# Patient Record
Sex: Female | Born: 1970 | Race: White | Hispanic: No | Marital: Married | Smoking: Former smoker
Health system: Southern US, Community
[De-identification: ages and names within clinical notes are randomized; demographics above are authoritative.]

## PROBLEM LIST (undated history)

## (undated) DIAGNOSIS — R0600 Dyspnea, unspecified: Secondary | ICD-10-CM

## (undated) DIAGNOSIS — I471 Supraventricular tachycardia, unspecified: Secondary | ICD-10-CM

## (undated) DIAGNOSIS — Z9889 Other specified postprocedural states: Secondary | ICD-10-CM

## (undated) DIAGNOSIS — T7840XA Allergy, unspecified, initial encounter: Secondary | ICD-10-CM

## (undated) DIAGNOSIS — I209 Angina pectoris, unspecified: Secondary | ICD-10-CM

## (undated) DIAGNOSIS — F419 Anxiety disorder, unspecified: Secondary | ICD-10-CM

## (undated) DIAGNOSIS — M1990 Unspecified osteoarthritis, unspecified site: Secondary | ICD-10-CM

## (undated) DIAGNOSIS — R112 Nausea with vomiting, unspecified: Secondary | ICD-10-CM

## (undated) DIAGNOSIS — Z9189 Other specified personal risk factors, not elsewhere classified: Secondary | ICD-10-CM

## (undated) DIAGNOSIS — F329 Major depressive disorder, single episode, unspecified: Secondary | ICD-10-CM

## (undated) DIAGNOSIS — T8859XA Other complications of anesthesia, initial encounter: Secondary | ICD-10-CM

## (undated) DIAGNOSIS — F32A Depression, unspecified: Secondary | ICD-10-CM

## (undated) DIAGNOSIS — G9050 Complex regional pain syndrome I, unspecified: Secondary | ICD-10-CM

## (undated) HISTORY — DX: Anxiety disorder, unspecified: F41.9

## (undated) HISTORY — DX: Other specified personal risk factors, not elsewhere classified: Z91.89

## (undated) HISTORY — DX: Complex regional pain syndrome I, unspecified: G90.50

## (undated) HISTORY — PX: SALPINGOSTOMY: SHX2372

## (undated) HISTORY — PX: APPENDECTOMY: SHX54

## (undated) HISTORY — DX: Depression, unspecified: F32.A

## (undated) HISTORY — DX: Unspecified osteoarthritis, unspecified site: M19.90

## (undated) HISTORY — DX: Allergy, unspecified, initial encounter: T78.40XA

## (undated) HISTORY — DX: Major depressive disorder, single episode, unspecified: F32.9

## (undated) HISTORY — PX: CHOLECYSTECTOMY: SHX55

---

## 1998-05-28 ENCOUNTER — Ambulatory Visit (HOSPITAL_COMMUNITY): Admission: RE | Admit: 1998-05-28 | Discharge: 1998-05-29 | Payer: Self-pay | Admitting: Surgery

## 1998-11-24 ENCOUNTER — Emergency Department (HOSPITAL_COMMUNITY): Admission: EM | Admit: 1998-11-24 | Discharge: 1998-11-24 | Payer: Self-pay | Admitting: Emergency Medicine

## 2000-03-26 ENCOUNTER — Encounter: Payer: Self-pay | Admitting: Emergency Medicine

## 2000-03-26 ENCOUNTER — Emergency Department (HOSPITAL_COMMUNITY): Admission: EM | Admit: 2000-03-26 | Discharge: 2000-03-26 | Payer: Self-pay | Admitting: Emergency Medicine

## 2000-03-27 ENCOUNTER — Emergency Department (HOSPITAL_COMMUNITY): Admission: EM | Admit: 2000-03-27 | Discharge: 2000-03-27 | Payer: Self-pay | Admitting: Emergency Medicine

## 2000-10-10 ENCOUNTER — Other Ambulatory Visit: Admission: RE | Admit: 2000-10-10 | Discharge: 2000-10-10 | Payer: Self-pay | Admitting: *Deleted

## 2002-04-15 ENCOUNTER — Emergency Department (HOSPITAL_COMMUNITY): Admission: EM | Admit: 2002-04-15 | Discharge: 2002-04-15 | Payer: Self-pay

## 2002-08-01 ENCOUNTER — Encounter: Payer: Self-pay | Admitting: *Deleted

## 2002-08-01 ENCOUNTER — Encounter: Admission: RE | Admit: 2002-08-01 | Discharge: 2002-08-01 | Payer: Self-pay | Admitting: *Deleted

## 2002-08-03 ENCOUNTER — Encounter: Payer: Self-pay | Admitting: Obstetrics & Gynecology

## 2002-08-03 ENCOUNTER — Inpatient Hospital Stay (HOSPITAL_COMMUNITY): Admission: AD | Admit: 2002-08-03 | Discharge: 2002-08-03 | Payer: Self-pay | Admitting: Obstetrics & Gynecology

## 2002-08-06 ENCOUNTER — Ambulatory Visit (HOSPITAL_COMMUNITY): Admission: AD | Admit: 2002-08-06 | Discharge: 2002-08-06 | Payer: Self-pay | Admitting: *Deleted

## 2002-08-06 ENCOUNTER — Encounter (INDEPENDENT_AMBULATORY_CARE_PROVIDER_SITE_OTHER): Payer: Self-pay | Admitting: Specialist

## 2003-06-16 ENCOUNTER — Other Ambulatory Visit: Admission: RE | Admit: 2003-06-16 | Discharge: 2003-06-16 | Payer: Self-pay | Admitting: Obstetrics and Gynecology

## 2003-08-25 ENCOUNTER — Encounter: Payer: Self-pay | Admitting: Obstetrics and Gynecology

## 2003-08-25 ENCOUNTER — Ambulatory Visit (HOSPITAL_COMMUNITY): Admission: RE | Admit: 2003-08-25 | Discharge: 2003-08-25 | Payer: Self-pay | Admitting: Obstetrics and Gynecology

## 2003-09-07 ENCOUNTER — Encounter: Payer: Self-pay | Admitting: *Deleted

## 2003-09-07 ENCOUNTER — Inpatient Hospital Stay (HOSPITAL_COMMUNITY): Admission: AD | Admit: 2003-09-07 | Discharge: 2003-09-07 | Payer: Self-pay | Admitting: Obstetrics and Gynecology

## 2004-02-11 ENCOUNTER — Inpatient Hospital Stay (HOSPITAL_COMMUNITY): Admission: AD | Admit: 2004-02-11 | Discharge: 2004-02-11 | Payer: Self-pay | Admitting: Obstetrics and Gynecology

## 2004-02-22 ENCOUNTER — Observation Stay (HOSPITAL_COMMUNITY): Admission: AD | Admit: 2004-02-22 | Discharge: 2004-02-23 | Payer: Self-pay | Admitting: Obstetrics and Gynecology

## 2004-03-07 ENCOUNTER — Inpatient Hospital Stay (HOSPITAL_COMMUNITY): Admission: AD | Admit: 2004-03-07 | Discharge: 2004-03-07 | Payer: Self-pay | Admitting: Obstetrics and Gynecology

## 2004-03-08 ENCOUNTER — Inpatient Hospital Stay (HOSPITAL_COMMUNITY): Admission: RE | Admit: 2004-03-08 | Discharge: 2004-03-21 | Payer: Self-pay | Admitting: *Deleted

## 2004-03-26 ENCOUNTER — Encounter (INDEPENDENT_AMBULATORY_CARE_PROVIDER_SITE_OTHER): Payer: Self-pay | Admitting: *Deleted

## 2004-03-26 ENCOUNTER — Inpatient Hospital Stay (HOSPITAL_COMMUNITY): Admission: AD | Admit: 2004-03-26 | Discharge: 2004-03-29 | Payer: Self-pay | Admitting: *Deleted

## 2004-04-29 ENCOUNTER — Other Ambulatory Visit: Admission: RE | Admit: 2004-04-29 | Discharge: 2004-04-29 | Payer: Self-pay | Admitting: *Deleted

## 2004-07-28 ENCOUNTER — Emergency Department (HOSPITAL_COMMUNITY): Admission: EM | Admit: 2004-07-28 | Discharge: 2004-07-28 | Payer: Self-pay | Admitting: Emergency Medicine

## 2005-05-05 ENCOUNTER — Other Ambulatory Visit: Admission: RE | Admit: 2005-05-05 | Discharge: 2005-05-05 | Payer: Self-pay | Admitting: Addiction Medicine

## 2005-05-12 ENCOUNTER — Encounter: Admission: RE | Admit: 2005-05-12 | Discharge: 2005-05-12 | Payer: Self-pay | Admitting: Gastroenterology

## 2005-06-03 ENCOUNTER — Emergency Department: Payer: Self-pay | Admitting: Emergency Medicine

## 2005-06-03 ENCOUNTER — Other Ambulatory Visit: Payer: Self-pay

## 2007-07-18 ENCOUNTER — Other Ambulatory Visit: Admission: RE | Admit: 2007-07-18 | Discharge: 2007-07-18 | Payer: Self-pay | Admitting: Obstetrics and Gynecology

## 2011-05-12 NOTE — Discharge Summary (Signed)
NAME:  Tiffany [name], Tiffany [name]                             ACCOUNT NO.:  [id number]   MEDICAL RECORD NO.:  [id number]                   PATIENT TYPE:  INP   LOCATION:  9148                                 FACILITY:  [facility]   PHYSICIAN:  Gerri Spore B. Earlene Plater, M.D.               DATE OF BIRTH:  1971-11-25   DATE OF ADMISSION:  03/08/2004  DATE OF DISCHARGE:  03/21/2004                                 DISCHARGE SUMMARY   ADMISSION DIAGNOSES:  1. Thirty-three weeks.  2. Preeclampsia.  3. Complaints of headache.   DISCHARGE DIAGNOSES:  1. Thirty-three weeks.  2. Preeclampsia.  3. Complaints of headache, resolved.   HISTORY OF PRESENT ILLNESS:  The patient was admitted after being monitored  as an outpatient with asymptomatic preeclampsia.  She had complaints of the  worst headache in her life with associated epigastric discomfort.   HOSPITAL COURSE:  The patient was admitted and 24-hour urine repeated.  CT  scan of the head, PIH labs were performed - all of which were normal.  The  patient had continued complaints of severe headache despite essentially  normal blood pressures in the 130s over 70s to no higher than 150s over 90s.  Neurological consultation was obtained and no abnormalities were found per  the neurologist.  Recommendation was for pain control with Darvocet which  the patient refused to take.  Recommendation had been for MRI and lumbar  puncture if symptoms worsened.  The patient was observed with intermittent  monitoring with reassuring status and intermittent PIH labs which were  normal.  Ultrasound showed appropriate fetal growth and reassuring  biophysical profile and amniotic fluid levels.   Ultimately, the patient's symptoms resolved as she had no further complaints  of headache or epigastric pain.  She had had continuously normal labs,  monitoring, and essentially normal blood pressures.  Her 24-hour urine was  stable and she was therefore discharged to home to be managed as an  outpatient.   DISCHARGE INSTRUCTIONS:  Preeclampsia precautions are given.   FOLLOW-UP:  [facility] in 2 days.   DISPOSITION AT DISCHARGE:  Improved.   DISCHARGE MEDICATIONS:  Prenatal vitamins one daily.                                               Gerri Spore B. Earlene Plater, M.D.    WBD/MEDQ  D:  03/23/2004  T:  03/23/2004  Job:  [id number]

## 2011-05-12 NOTE — Discharge Summary (Signed)
NAME:  Tiffany [name], Tiffany [name]                             ACCOUNT NO.:  [id number]   MEDICAL RECORD NO.:  [id number]                   PATIENT TYPE:  INP   LOCATION:  9124                                 FACILITY:  [facility]   PHYSICIAN:  Maxie Better, M.D.            DATE OF BIRTH:  Nov 10, 1971   DATE OF ADMISSION:  03/26/2004  DATE OF DISCHARGE:  03/29/2004                                 DISCHARGE SUMMARY   ADMISSION DIAGNOSES:  1. Preterm labor.  2. Intrauterine gestation at 35+ weeks.  3. Breech presentation.  4. Previous cesarean section.   DISCHARGE DIAGNOSES:  1. Intrauterine gestation at 35+ weeks, delivered.  2. Preterm labor.  3. Complete breech presentation.  4. Previous cesarean section.  5. Postoperative anemia.   PROCEDURE:  Repeat cesarean section.   HISTORY OF PRESENT ILLNESS:  A 40 year old gravida 6 para 2-0-3-2 female at  [redacted] weeks gestation with previous cesarean section, known breech at  presentation, who presented in preterm labor.  This patient has had  bilateral salpingectomies due to ectopic pregnancy including a left cornual  horn removal.  The patient had transfundal surgery and therefore cannot  labor.   HOSPITAL COURSE:  The patient was admitted.  She was taken to the operating  room where she underwent the repeat cesarean section with resultant delivery  of a breech live female, 7 pounds 4 ounces, with Apgars of 8 and 9.  The  patient had an uncomplicated postoperative course.  A CBC on postoperative  day #1 showed a hemoglobin of 10.9; hematocrit of 31.9; platelets 218,000.  By postoperative day #3 the patient was tolerating a regular diet, no  evidence of infection, was deemed well to be discharged home.   DISPOSITION:  Home.   CONDITION:  Stable.   FOLLOW-UP:  Follow-up appointment at [facility] in 4-6 weeks.   DISCHARGE MEDICATIONS:  1. Motrin 600 mg one p.o. q.6h. p.r.n. pain.  2. Percocet 5 #20 one to two tablets p.o. q.4h. p.r.n. pain.  3. Over-the-counter iron supplementation one p.o. b.i.d.   DISCHARGE INSTRUCTIONS:  Per postpartum booklet.  PIH warning signs were  reviewed.                                               Maxie Better, M.D.    [place]/MEDQ  D:  04/08/2004  T:  04/08/2004  Job:  [id number]

## 2011-05-12 NOTE — Consult Note (Signed)
NAME:  Tiffany [name], Tiffany [name]                             ACCOUNT NO.:  [id number]   MEDICAL RECORD NO.:  [id number]                   PATIENT TYPE:  MAT   LOCATION:  MATC                                 FACILITY:  [facility]   PHYSICIAN:  Lenoard Aden, M.D.             DATE OF BIRTH:  11-02-1971   DATE OF CONSULTATION:  02/11/2004  DATE OF DISCHARGE:                                   CONSULTATION   CHIEF COMPLAINT:  Decreased fetal movement, headache and visual scotomata.   HISTORY OF PRESENT ILLNESS:  A 40 year old white female, G6, P2, at 29  weeks; who presents with aforementioned symptoms.  Previous history of  pregnancy-induced hypertension.   ALLERGIES:  STADOL.   MEDICATIONS:  Prenatal vitamins.   PAST HISTORY:  1. History of ectopic pregnancy, 15-week pregnancy loss.  2. Cesarean section, VBAC and recurrent ectopic pregnancy.  3. This was an IVF-induced conception.  4. History of bladder infections, kidney infections.  5. OCD and depression.  6. Asthma.   FAMILY HISTORY:  Hypertension, angina, COPD and kidney disease.   PHYSICAL EXAMINATION:  GENERAL:  She is a well developed, well nourished  white female; in no acute distress.  HEENT:  Normal.  LUNGS:  Clear.  HEART:  Regular rhythm.  ABDOMEN:  Soft, gravid and nontender.  EXTREMITIES:  No cords.  NEUROLOGIC:  Nonfocal.  PELVIC:  Deferred.   LABORATORY DATA:  NST reactive, no contractions.  LFT within normal limits.  CBC within normal limits.  LDH less than 105.  Uric acid 2.1.   IMPRESSION:  1. A 29-week OB.  2. Decreased fetal movement, with reassuring fetal heart tracing.  3. Visual and neurologic symptoms.  No evidence of preeclampsia with normal     blood pressures and normal labs.   PLAN:  Discharge home.  Pregnancy-induced hypertension  precautions are  given.  Follow up in the office as scheduled in one week.  Fetal activity is  discussed.                                               Lenoard Aden,  M.D.    RJT/MEDQ  D:  02/11/2004  T:  02/11/2004  Job:  [id number]

## 2011-05-12 NOTE — Consult Note (Signed)
NAME:  Tiffany [name], Tiffany [name]                             ACCOUNT NO.:  [id number]   MEDICAL RECORD NO.:  [id number]                   PATIENT TYPE:  MAT   LOCATION:  MATC                                 [facility]ACILITY[facility]   PHYSICIAN:  Lenoard Aden, M.D.             DATE OF BIRTH:  1971/11/20   DATE OF CONSULTATION:  03/07/2004  DATE OF DISCHARGE:                                   CONSULTATION   CHIEF COMPLAINT:  Rule out preeclampsia.   HISTORY OF PRESENT ILLNESS:  The patient is a 40 year old white female, G6,  P2, at [redacted] weeks gestation who presents with elevated blood pressure,  decreased fetal movement, headache, and visual symptomatology. She denies  epigastric pain, nausea, or vomiting. She has allergies to Stadol.  Medications are prenatal vitamins.   PAST HISTORY:  Remarkable for history of ectopic pregnancy, 15 week  pregnancy loss, Cesarean section, VBAC, recurrent ectopic pregnancy, IVF  induced conception, history of bladder and kidney infection, history of OCD  and depression, and history of asthma.   FAMILY HISTORY:  Hypertension, angina, COPD, and kidney disease.   PHYSICAL EXAMINATION:  VITAL SIGNS: Today, blood pressure 125/77.  NEUROLOGIC: Nonfocal.  HEENT: Normal.  LUNGS: Clear.  HEART: Regular rate and rhythm.  ABDOMEN: Soft, gravid, nontender. DTRs are 2 to 3+. No evidence of clonus.  Trace pretibial edema noted.   PIH panel within normal limits. FFN is negative. Cervical exam per os is 1  cm, vertex, thick, and -2.   IMPRESSION:  1. A 32-week pregnancy.  2. No evidence of preeclampsia.  3. Reassuring fetal heart rates as well as a reactive NST today, and BPP of     8/8.   PLAN:  Discharge home; follow up in 24 hours; give Procardia 10 mg to take  q.4h. times 24 hours; follow up in the office with Dr. Earlene Plater within one day.                                               Lenoard Aden, M.D.    RJT/MEDQ  D:  03/07/2004  T:  03/08/2004  Job:  [id number]

## 2011-05-12 NOTE — Op Note (Signed)
NAME:  Tiffany [name], Tiffany [name]                             ACCOUNT NO.:  [id number]   MEDICAL RECORD NO.:  [id number]                   PATIENT TYPE:  AMB   LOCATION:  SDC                                  [facility]ILITY:  [facility]   PHYSICIAN:  Gerri Spore B. Earlene Plater, M.D.               DATE OF BIRTH:  [birth year]   DATE OF PROCEDURE:  08/06/2002  DATE OF DISCHARGE:                                 OPERATIVE REPORT   PREOPERATIVE DIAGNOSIS:  Right tubal pregnancy. Desires salpingectomy.   POSTOPERATIVE DIAGNOSIS:  Right tubal pregnancy. Desires salpingectomy.   PROCEDURE:  1. Open laparoscopy.  2. Lysis of adhesions.  3. Right salpingectomy.   SURGEON:  Chester Holstein. Earlene Plater, M.D.   ANESTHESIA:  General.   FINDINGS:  Right tubal pregnancy. Mid tubal position unruptured. About 75 cc  of old blood in the pelvis. Normal tubes and ovaries bilaterally. Some filmy  adhesions over the left ovary from the sigmoid colon to the left pelvic  sidewall over the left ovary. Normal appearing uterus.   ESTIMATED BLOOD LOSS:  About 75 cc of old blood; otherwise, none.   SPECIMENS:  Right tubal pregnancy.   COMPLICATIONS:  None.   DISPOSITION:  To the recovery room in stable condition.   INDICATIONS FOR PROCEDURE:  A patient with a history of left salpingectomy  for ectopic pregnancy. 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  She presents with  a right ectopic pregnancy and the patient  has decided she desires  salpingectomy after being offered less evasive and less definitive treatment  with salpingostomy or methotrexate. The patient  expressly understands this  would leave her without a functioning tube and, therefore, unable to  naturally conceive.  I have mentioned in vitro fertilization would be an  option if that is what she desires should they decide to proceed with  further childbirth.   PROCEDURE:  The patient was taken to the operating room and general  anesthesia obtained. She was placed in ski position and prepped  and draped  in the standard fashion. The bladder was drained with a Foley catheter. The  cervix was grasped with a single-tooth tenaculum and the Hulka tenaculum  inserted and attached. The instruments were then removed from the vagina.   Attention was turned to the abdomen and gown and gloves were changed. A 10  mm infraumbilical skin fold incision was done with knife after local  infiltration with 5 cc of 0.5% Marcaine. The dissection was carried sharply  to the fascia. The fascia was divided sharply with a knife and elevated with  Kocher clamps. The patient's anterior sheath and peritoneum were then  entered sharply. Pursestring sutures of #0 Vicryl was placed around the  fascial defect and the Hasson cannula inserted and secured.  Pneumoperitoneum obtained after abdominal placement confirmed. The inferior  ports were placed on the left and right about 8 cm out from  the midline, 2  cm above the symphysis after local infiltration with Marcaine under direct  laparoscopic visualization. The omental adhesions were 1-2 cm areas in the  midline near a previous vertical midline incision. These were inspected and  found to be free of underlying bowel. They were, therefore, cauterized at  the adhesions on the abdominal wall with bipolar and divided sharply.   The patient was then placed in Trendelenburg position and the bowel  mobilized superiorly. A right tubal pregnancy was noted as outlined above.  There were some adhesions from the right tube to the right pelvic sidewall  which were taken down sharply and this freed the right tube. The mesosalpinx  was then cauterized with bipolar cautery and divided sharply down to the  level of the uterotubal junction. The specimen was removed through the  Hasson port and submitted as right tubal pregnancy. The site of dissection  was hemostatic after the pelvis was irrigated. The course of each ureter was  identified and appeared to be normal in location  and well-away from the area  of the dissection.   The inferior ports were removed and their sites were hemostatic. The Hasson  and scope were removed and gas released. I inserted my index finger through  the fascial defect, snugged the pursestring suture down to fasten the  fascial defect to it. No intraabdominal contents were near it prior to  closure. Each skin site was closed with subcuticular 4-0 Vicryl.   The patient  tolerated the procedure well. There were no complications. She  was taken to the recovery room in good postoperative condition.Gerri Spore B. Earlene Plater, M.D.    WBD/MEDQ  D:  08/06/2002  T:  08/07/2002  Job:  [id number]

## 2011-05-12 NOTE — Op Note (Signed)
NAME:  Tiffany [name], Tiffany [name]                             ACCOUNT NO.:  [id number]   MEDICAL RECORD NO.:  [id number]                   PATIENT TYPE:  INP   LOCATION:  9124                                 FACILITY:  [facility]   PHYSICIAN:  Maxie Better, M.D.            DATE OF BIRTH:  June 17, 1971   DATE OF PROCEDURE:  03/26/2004  DATE OF DISCHARGE:                                 OPERATIVE REPORT   PREOPERATIVE DIAGNOSES:  1. Preterm labor.  2. Intrauterine gestation at 35+ weeks.  3. Breech presentation.  4. Previous cesarean section.   PROCEDURE:  Repeat cesarean section, Kerr hysterotomy.   POSTOPERATIVE DIAGNOSES:  1. Preterm labor.  2. Intrauterine pregnancy at 35+ weeks.  3. Complete breech presentation.  4. Previous cesarean section.   ANESTHESIA:  Spinal.   SURGEON:  Maxie Better, M.D.   ASSISTANT:  Gerri Spore B. Earlene Plater, M.D.   FINDINGS:  Complete breech presentation of a live female, 7 pounds 4 ounces,  Apgars of 8 and 9.  Placenta spontaneous, intact, to pathology.  Normal  right ovary, surgically absent or not seen left ovary, and surgically absent  tubes bilaterally.   INDICATIONS:  This is a 40 year old gravida 6, para 2-0-3-2, female at 17+  weeks' gestation with a previous cesarean section and known breech  presentation, who presented for [facility] labs and nonstress test at [facility] on March 26, 2004, who was found to be having contractions with a  cervical exam of 3 cm, 70%, with the presenting part out of the pelvis.  The  patient's exam in the office last week was 1 cm dilatation.  Her history has  been complicated by chronic headache, for which she has had neurological  evaluation, and for prolonged admissions for evaluation for preeclampsia due  to proteinuria.  This is an IVF pregnancy.  The patient has had bilateral  salpingectomies due to ectopic pregnancy, including a left cornual horn  removal.  She has had transfundal surgery and therefore cannot labor.   Given  the cervical exam, the decision was made to proceed with delivery of this  patient.  Risks and benefits of the procedure have been explained to the  patient, consent was signed, and the patient was transferred to the  operating room.   PROCEDURE:  Under adequate spinal anesthesia, the patient was placed in a  supine position with a left lateral tilt.  She was sterilely prepped and  draped in the usual fashion.  An indwelling Foley catheter was sterilely  placed.  Marcaine 0.25% was injected along the previous Pfannenstiel skin  incision.  The Pfannenstiel skin incision was then made, carried down to the  rectus fascia, rectus fascia incised in the midline, extended bilaterally.  The rectus fascia was then bluntly and sharply dissected off the rectus  muscle in an inferior fashion and the parietal peritoneum was entered prior  to dissecting off the rectus  fascia superiorly due to the thinness of the  rectus muscle bilaterally.  On entering the cavity there were no adhesions  noted fundally, and there were minimal bladder adhesions.  The parietal  peritoneum was extended superiorly and inferiorly.  The vesicouterine  peritoneum was then developed.  There was a well-developed lower uterine  segment, thin, noted at the time of the surgery.  A curvilinear low  transverse uterine incision was then made and extended bilaterally using  bandage scissors.  Clear amniotic fluid sac noted.  The sac was ruptured and  subsequent delivery  of a live female from a complete breech presentation.  The baby was bulb-suctioned on the abdomen, the cord was clamped and cut.  The baby was transferred to the waiting pediatrician, who assigned Apgars of  8 and 9 at one and five minutes.  The placenta was spontaneous intact to  pathology.  The uterine cavity was cleaned of debris.  Uterine incision was  closed in two layers, the first layer with 0 Monocryl running locked stitch,  the second layer was closed  with 0 Monocryl in an imbricating fashion.  Good  hemostasis was then noted, small bleeders cauterized.  The abdomen was  irrigated, suctioned of debris.  The remaining findings as noted above.  The  parietal peritoneum was closed with 3-0 Vicryl suture, the rectus fascia was  approximated with 0 Vicryl x2, the subcutaneous area was irrigated,  suctioned of debris, small bleeders cauterized.  The skin was then  approximated using Ethicon staples.  Specimen was placenta, sent to  pathology.  Estimated blood loss was 500 mL.  Intraoperative fluid was 2 L.  Urine output was 200 mL clear yellow urine.  Sponge and instrument count x2  was correct.  Complication was none.  The patient tolerated the procedure  well and was transferred to the recovery room in stable condition.                                               Maxie Better, M.D.    [place]/MEDQ  D:  03/26/2004  T:  03/28/2004  Job:  [id number]

## 2011-05-12 NOTE — Consult Note (Signed)
NAME:  Tiffany [name], Tiffany [name]                             ACCOUNT NO.:  [id number]   MEDICAL RECORD NO.:  [id number]                   PATIENT TYPE:  INP   LOCATION:  9154                                 FACILITY:  [facility]   PHYSICIAN:  Deanna Artis. Sharene Skeans, M.D.           DATE OF BIRTH:  [birth year]   DATE OF CONSULTATION:  03/10/2004  DATE OF DISCHARGE:                                   CONSULTATION   CHIEF COMPLAINT:  Headaches.   HISTORY OF THE PRESENT CONDITION:  Tiffany [name] is a 40 year old gravida 6 para  0-2-3-2 woman who is now between 35 and [redacted] weeks gestational age.  The  patient has a history of preeclampsia which was also true in her previous  pregnancy.  The patient was admitted with a severe headache, changes in  vision, and epigastric pain.  She had very sharp headaches that were  lancinating, extending from temple to temple, lasting for less than 30  seconds in duration that occurred between five and ten times on Monday  night.  She has had a few brief headaches like this since that time but  basically her headache has now become a dull frontally-predominant,  unremitting headache that is unassociated with nausea, vomiting, sensitivity  to light or sound, or movement.   The patient has had variable blood pressures.  She has not had nasal  drainage,  runny nose, cough, fever, or other signs of infection,  meningismus, or signs of intracranial hemorrhage.   CT scan of the brain today was normal other than suggesting sphenoid  sinusitis.  Careful observation of the brain window and bone window suggest  that the sinusitis is actually volume averaging of the bone above and below  the sphenoid sinus and there is no sinusitis at this time.  In addition, the  patient's symptoms would not suggest the presence of sinusitis on the basis  of no fever, purulent drainage, runny nose, or postnasal drip.   In addition, though, blood pressures remain elevated for a pregnancy lady.  They have not  been high enough that one would ordinarily think of toxemia as  an etiology for her symptoms.   PAST MEDICAL HISTORY:  Remarkable for panic disorder, migraine headaches.  She states categorically that the headaches that she is experiencing now are  not at all like her migraines which were unilateral, pounding, associated  with nausea and vomiting, sensitivity to light and sound, and last occurred  a couple of years ago.   The patient has had two tubal pregnancies.  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  The patient  has had two other successful pregnancies.  She also has a history of  obsessive-compulsive disorder, depression, panic disorder, and asthma.  For  this reason, though there are medications that would be useful for her with  her headaches, she  stays away from them because they usually have  exacerbated one or more of her conditions.   FAMILY HISTORY:  Remarkable for migraine headaches in her mother and sister;  for depression in her mother; for hypertension, heart disease, COPD, and  kidney disease in other family members.  There is no other neurological  family history including berry aneurysms, tumors, hydrocephalus.   MEDICATIONS:  Prenatal vitamins.  She is now on Ambien at bedtime.  It does  not appear to me that she is on other medications at this time for blood  pressure.   DRUG ALLERGIES:  None.   INTOLERANCES:  STADOL, which exacerbated her panic disorder.   SOCIAL HISTORY:  The patient's husband works for [employer].  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  She has two older children who are at her bedside.  She does not smoke nor  does she use alcohol.   PHYSICAL EXAMINATION TODAY:  GENERAL:  This is a well-developed, well-  nourished, pleasant woman, blond-haired, blue-eyed, in no acute distress.  She says that her headache is 8/10 but her face does not  certainly suggest  that.  VITAL SIGNS:  Blood pressure 132/78, resting pulse 80.  HEENT:  No signs of infection, supple neck, full range of motion.  No  localized tenderness in her head and neck region.  No loss of range of  motion in her head and neck.  No cranial or cervical bruits.  LUNGS:  Clear to auscultation.  HEART:  No murmurs, pulses normal  ABDOMEN:  Soft, nontender, gravid, in no distress.  EXTREMITIES:  Well formed without edema, cyanosis, alternations in tone, or  tight heel cords.  NEUROLOGIC:  Mental status:  Awake, alert, pleasant, cooperative, normal  orientation, language, memory, and fund of knowledge.  Cranial nerves:  Round reactive pupils, normal fundi, with sharp disc margins.  The patient  does not have obvious venous pulsations but I do not think she has increased  intracranial pressure nor even early papiledema.  Visual fields full to  double simultaneous stimuli.  Extraocular movements full and conjugate.  Symmetric facial strength and sensation.  Air conduction greater than bone  conduction bilaterally.  She was able to protrude her tongue and elevate her  uvula midline.   Motor examination:  Normal strength, tone, and mass.  Good fine motor  movements.  No pronator drift.  Sensation intact to cold, vibration, and  stereoagnosis.  Cerebellar examination:  Good finger-to-nose, rapid  repetitive movements.  Gait and station was normal.  She can walk on her  heels, toes, perform a tandem without difficulty.  Romberg negative.  Deep  tendon reflexes symmetric and quite brisk generally.  The patient had  bilateral flexor plantar responses.   IMPRESSION:  Headache disorder, 784.0.  Etiology of the headache is unknown.  It may be related to her blood pressure; however, this is not certain.   The patient does not show any signs of posterior circulation edema that  might be expected with eclampsia.  She has a normal CT scan of the brain and does not have sphenoid  sinusitis as suggested in the formal CT report.  There are no signs of subarachnoid hemorrhage, hydrocephalus, abscess, or  tumor.  She does not have meningismus or any signs of infection in the head  and neck.  She has a nonfocal neurologic examination.  In addition, this  headache has happened before in  her last third trimester of pregnancy.   Finally, the patient has a migraine disorder but it has not been active and  the headaches do not represent migraines.   RECOMMENDATIONS:  Tylenol as is currently being done, 650 mg q.4-6h.  The  patient could get Darvocet-N 100 if the headaches worsened.  There is no  need for further imaging or lumbar puncture.  I would also continue to work  to control the blood pressure as I know you are doing.  At the present I do  not believe that there is a definable cause for her headaches and I do not  at present see any serious cause of headaches that would be related to  further  morbidity or mortality is etiology of her problem at this time.  I  appreciate the opportunity to see her.  I will be happy to see her again  should her symptoms worsen.  If you have questions about this or I can be of  assistance do not hesitate to contact me.                                               Deanna Artis. Sharene Skeans, M.D.    [facility]  D:  03/10/2004  T:  03/11/2004  Job:  [id number]

## 2011-05-12 NOTE — H&P (Signed)
NAME:  Tiffany [name], Tiffany [name]                             ACCOUNT NO.:  [id number]   MEDICAL RECORD NO.:  [id number]                   PATIENT TYPE:  INP   LOCATION:  9175                                 FACILITY:  [facility]   PHYSICIAN:  Gerri Spore B. Earlene Plater, M.D.               DATE OF BIRTH:  1971-12-20   DATE OF ADMISSION:  03/08/2004  DATE OF DISCHARGE:                                HISTORY & PHYSICAL   ADMISSION DIAGNOSES:  1. Thirty-three week intrauterine pregnancy.  2. Preeclampsia.  3. Complaints of headache, visual disturbances, and epigastric pain.   HISTORY OF PRESENT ILLNESS:  A 40 year old white female, gravida 6, para 2,  at 33+ weeks, who was seen in the office today in follow-up with a history  of preeclampsia which was being managed as an outpatient.  She had  complaints today of a headache which she rated as an 8/10, visual  disturbances, and epigastric pain.  The patient has had reassuring  outpatient surveillance and an appropriately-growing fetus on ultrasound in  the office __________, 2005, where she was found to have an AFI of 19.7,  breech presentation has been persistent.   Given the escalation in symptoms without any other obvious explanation for  her complaints, she is admitted for close observation, rechecking of  preeclampsia labs, continuous monitoring, and plans for amniocentesis with  delivery if lungs are mature.  The patient has been informed this is not  tremendously likely; however, if lungs are mature with her complaints as  outlined above, it would seem reasonable to deliver her as they would likely  represent severe preeclampsia.  Of note, the range of blood pressure that  she has had has been in the 120s-150s/70s-90s.   PAST MEDICAL HISTORY:  Laparotomy with removal of cornual pregnancy.  By the  description in the operative note from [facility], this sounded like it  represented transfundal uterine surgery, in my opinion would be concerning  for possible  increased risk of rupture with trial of labor.  Notably, she  has successfully had VBAC thereafter.  In addition, she has had a recent  ectopic pregnancy and this pregnancy occurred after in vitro fertilization.  History of OCD and depression and asthma.   FAMILY HISTORY:  Hypertension, heart disease, COPD, and kidney disease.   MEDICATIONS:  Prenatal vitamins.   ALLERGIES:  STADOL.   PHYSICAL EXAMINATION:  VITAL SIGNS:  Blood pressure in the office today is  in the 120s/80s, otherwise normal.  GENERAL:  The patient is alert and oriented, in no acute distress.  She has  normal gait and normal motor function, and cranial nerve function appears to  be intact.  HEENT:  Normocephalic, atraumatic.  Sclerae are anicteric.  CARDIAC:  Regular rate and rhythm.  CHEST:  Lungs are clear to auscultation.  ABDOMEN:  Gravid with an appropriate fundal height, breech presentation by  Leopold's.  PELVIC:  Cervix is 1-2 cm dilated, 50% effaced, -2 station, and breech by  palpation.  EXTREMITIES:  Lower extremities show trace edema and 3+ deep tendon reflexes  without clonus.   ASSESSMENT:  Thirty-three plus week intrauterine pregnancy with  preeclampsia, now complaining of symptoms suggestive of severe preeclampsia.  Of note, she has received steroids earlier last week due to preterm  contractions and cervical change.  We will check PIH labs and monitor her  closely with serial blood pressures.  Should symptoms persist and no other  obvious explanation can be found, delivery may be indicated.  I have  discussed with the patient the possibility of amniocentesis for lung  maturity studies as this would certainly aid in the decision-making process,  and the patient is agreeable.  Arrangements will be made in this regard.                                               Gerri Spore B. Earlene Plater, M.D.    [facility]  D:  03/08/2004  T:  03/09/2004  Job:  [id number]

## 2013-11-10 ENCOUNTER — Ambulatory Visit (INDEPENDENT_AMBULATORY_CARE_PROVIDER_SITE_OTHER): Payer: 59 | Admitting: Family Medicine

## 2013-11-10 ENCOUNTER — Ambulatory Visit: Payer: 59

## 2013-11-10 VITALS — BP 116/72 | HR 80 | Temp 98.7°F | Resp 18 | Ht 64.5 in | Wt 220.0 lb

## 2013-11-10 DIAGNOSIS — Z124 Encounter for screening for malignant neoplasm of cervix: Secondary | ICD-10-CM

## 2013-11-10 DIAGNOSIS — N644 Mastodynia: Secondary | ICD-10-CM

## 2013-11-10 DIAGNOSIS — I471 Supraventricular tachycardia: Secondary | ICD-10-CM

## 2013-11-10 DIAGNOSIS — R059 Cough, unspecified: Secondary | ICD-10-CM

## 2013-11-10 DIAGNOSIS — R05 Cough: Secondary | ICD-10-CM

## 2013-11-10 DIAGNOSIS — N63 Unspecified lump in unspecified breast: Secondary | ICD-10-CM

## 2013-11-10 DIAGNOSIS — I498 Other specified cardiac arrhythmias: Secondary | ICD-10-CM

## 2013-11-10 DIAGNOSIS — F411 Generalized anxiety disorder: Secondary | ICD-10-CM

## 2013-11-10 LAB — POCT CBC
HCT, POC: 49.5 % — AB (ref 37.7–47.9)
Lymph, poc: 2.2 (ref 0.6–3.4)
MCH, POC: 31.5 pg — AB (ref 27–31.2)
MCHC: 31.7 g/dL — AB (ref 31.8–35.4)
MCV: 99.3 fL — AB (ref 80–97)
POC Granulocyte: 6.7 (ref 2–6.9)
POC LYMPH PERCENT: 23.2 %L (ref 10–50)
RDW, POC: 13.3 %
WBC: 9.3 10*3/uL (ref 4.6–10.2)

## 2013-11-10 LAB — TSH: TSH: 1.797 u[IU]/mL (ref 0.350–4.500)

## 2013-11-10 LAB — POCT URINALYSIS DIPSTICK
Bilirubin, UA: NEGATIVE
Ketones, UA: NEGATIVE
Leukocytes, UA: NEGATIVE
Nitrite, UA: NEGATIVE
Protein, UA: NEGATIVE
pH, UA: 5.5

## 2013-11-10 LAB — LIPID PANEL
Cholesterol: 147 mg/dL (ref 0–200)
HDL: 39 mg/dL — ABNORMAL LOW (ref >39)
Total CHOL/HDL Ratio: 3.8 Ratio
Triglycerides: 92 mg/dL (ref <150)
VLDL: 18 mg/dL (ref 0–40)

## 2013-11-10 LAB — COMPREHENSIVE METABOLIC PANEL
Alkaline Phosphatase: 147 U/L — ABNORMAL HIGH (ref 39–117)
BUN: 7 mg/dL (ref 6–23)
CO2: 25 mEq/L (ref 19–32)
Creat: 0.73 mg/dL (ref 0.50–1.10)
Glucose, Bld: 95 mg/dL (ref 70–99)
Sodium: 137 mEq/L (ref 135–145)
Total Bilirubin: 0.8 mg/dL (ref 0.3–1.2)

## 2013-11-10 LAB — POCT UA - MICROSCOPIC ONLY
Bacteria, U Microscopic: NEGATIVE
Casts, Ur, LPF, POC: NEGATIVE
Mucus, UA: NEGATIVE
WBC, Ur, HPF, POC: NEGATIVE
Yeast, UA: NEGATIVE

## 2013-11-10 NOTE — Progress Notes (Signed)
Subjective:    Patient ID: Tiffany [name], female    DOB: May 14, 1971, 42 y.o.   MRN: [id number]  HPI  This 42 y.o. female presents for evaluation of several concerns.  1. She's long had several lumps in her breasts, laterally. Mother and sisters have had lumpy breasts, and they never amounted to anything, so she wasn't worried until a couple of days ago when the RIGHT nipple began to hurt. No discharge. Mammogram baseline at age 78 (when worked for [employer]) was reportedly normal. None since.  2. She has a cough, mostly non-productive, x 7-10 days.  Came on initially with a host of URI-type symptoms, which have all resolved.  Overall, she feels better, but wants a CXR. Both her parents had TB, though she's never tested positive for TB herself.  Additionally, her adoptive father had aspergillosis, and it's possible that her mother, who died of COPD, had it as well.  It has been several years since her last labs, and would like to update them.  No recent pelvic exam or pap test.  Last tetanus 05/2012    Active Ambulatory Problems    Diagnosis Date Noted  . SVT (supraventricular tachycardia) 11/10/2013  . Generalized anxiety disorder 11/10/2013   Resolved Ambulatory Problems    Diagnosis Date Noted  . No Resolved Ambulatory Problems   Past Medical History  Diagnosis Date  . Allergy   . Anxiety   . Arthritis   . Depression   . DES exposure in utero     Past Surgical History  Procedure Laterality Date  . Cholecystectomy    . Cesarean section      x2  . Salpingostomy      due to ectopic pregnancy    Allergies  Allergen Reactions  . Stadol [Butorphanol] Shortness Of Breath  . Other     Beta blockers exacerbate asthma     Prior to Admission medications   Medication Sig Start Date End Date Taking? Authorizing Provider  ALPRAZolam Prudy Feeler) 0.5 MG tablet Take 0.5 mg by mouth at bedtime as needed for anxiety.   Yes Historical Provider, MD  DULoxetine (CYMBALTA) 60 MG capsule Take 60  mg by mouth daily.   Yes Historical Provider, MD  diltiazem (CARDIZEM LA) 180 MG 24 hr tablet Take 180 mg by mouth daily as needed (SVT).    Historical Provider, MD    History   Social History  . Marital Status: Married    Spouse Name: Kennedy Bucker    Number of Children: 3  . Years of Education: N/A   Occupational History  . CNA      [employer]   Social History Main Topics  . Smoking status: Former Smoker -- 1.00 packs/day for 15 years    Types: Cigarettes    Quit date: 12/24/2001  . Smokeless tobacco: None  . Alcohol Use: Yes     Comment: a couple of times/year  . Drug Use: No  . Sexual Activity: Yes    Partners: Female    Birth Control/ Protection: Surgical   Other Topics Concern  . None   Social History Narrative   Lives with her husband, and their 3 children. She will be leaving her current job to stay home and care for and home school her youngest daughter, age 63, with [employer].  Her 10 year old daughter and 71 year old son are also living with her. Her older daughter has a septate uterus, thought due to the patient's intra-uterine exposure to DES.  family history includes Arthritis in her daughter and daughter; COPD in her mother; Cancer in her maternal grandmother; Heart disease in her maternal grandmother and mother; Hyperlipidemia in her maternal grandmother, mother, and sister; Hypertension in her brother, maternal grandmother, mother, and sister; Mental illness in her father and mother; Stroke in her maternal grandmother; Supraventricular tachycardia in her sister and sister. indicated that her mother is deceased. She indicated that her father is alive. She indicated that all of her three sisters are alive. She indicated that her brother is alive. She indicated that her maternal grandmother is deceased. She indicated that both of her daughters are alive. She indicated that her son is alive.      Review of Systems As above, otherwise negative.    Objective:    Physical Exam  Vitals reviewed. Constitutional: She is oriented to person, place, and time. Vital signs are normal. She appears well-developed and well-nourished. She is active and cooperative. No distress.  Her husband accompanies her today.  HENT:  Head: Normocephalic and atraumatic.  Right Ear: Hearing, tympanic membrane, external ear and ear canal normal. No foreign bodies.  Left Ear: Hearing, tympanic membrane, external ear and ear canal normal. No foreign bodies.  Nose: Nose normal.  Mouth/Throat: Uvula is midline, oropharynx is clear and moist and mucous membranes are normal. No oral lesions. Normal dentition. No dental abscesses or uvula swelling. No oropharyngeal exudate.  Eyes: Conjunctivae, EOM and lids are normal. Pupils are equal, round, and reactive to light. Right eye exhibits no discharge. Left eye exhibits no discharge. No scleral icterus.  Fundoscopic exam:      The right eye shows no arteriolar narrowing, no AV nicking, no exudate, no hemorrhage and no papilledema.       The left eye shows no arteriolar narrowing, no AV nicking, no exudate, no hemorrhage and no papilledema.  Neck: Trachea normal, normal range of motion and full passive range of motion without pain. Neck supple. No spinous process tenderness and no muscular tenderness present. No mass and no thyromegaly present.  Cardiovascular: Normal rate, regular rhythm, normal heart sounds, intact distal pulses and normal pulses.   Pulmonary/Chest: Effort normal and breath sounds normal. She exhibits no tenderness and no retraction. Right breast exhibits no inverted nipple, no mass, no nipple discharge, no skin change and no tenderness. Left breast exhibits no inverted nipple, no mass, no nipple discharge, no skin change and no tenderness. Breasts are symmetrical.  Abdominal: Soft. Normal appearance and bowel sounds are normal. She exhibits no distension and no mass. There is no hepatosplenomegaly. There is no tenderness. There  is no rigidity, no rebound, no guarding, no CVA tenderness, no tenderness at McBurney's point and negative Murphy's sign. No hernia. Hernia confirmed negative in the right inguinal area and confirmed negative in the left inguinal area.  Genitourinary: Rectum normal, vagina normal and uterus normal. Rectal exam shows no external hemorrhoid and no fissure. No breast swelling, tenderness, discharge or bleeding. Pelvic exam was performed with patient supine. No labial fusion. There is no rash, tenderness, lesion or injury on the right labia. There is no rash, tenderness, lesion or injury on the left labia. Cervix exhibits no motion tenderness, no discharge and no friability. Right adnexum displays no mass, no tenderness and no fullness. Left adnexum displays no mass, no tenderness and no fullness. No erythema, tenderness or bleeding around the vagina. No foreign body around the vagina. No signs of injury around the vagina. No vaginal discharge found.  The RIGHT  nipple is darker in color than the LEFT. No nipple inversion or discharge. No skin changes.  No discrete lumps in the breasts. Mild tenderness in the far lateral breast bilaterally. The cervix appears normal, though cock's comb possibly present.  Difficult to visualize the superior aspect of the cervix on this exam.  Musculoskeletal: She exhibits no edema and no tenderness.       Cervical back: Normal.       Thoracic back: Normal.       Lumbar back: Normal.  Lymphadenopathy:       Head (right side): No tonsillar, no preauricular, no posterior auricular and no occipital adenopathy present.       Head (left side): No tonsillar, no preauricular, no posterior auricular and no occipital adenopathy present.    She has no cervical adenopathy.    She has no axillary adenopathy.       Right: No inguinal and no supraclavicular adenopathy present.       Left: No inguinal and no supraclavicular adenopathy present.  Neurological: She is alert and oriented to  person, place, and time. She has normal strength and normal reflexes. No cranial nerve deficit. She exhibits normal muscle tone. Coordination and gait normal.  Skin: Skin is warm, dry and intact. No rash noted. She is not diaphoretic. No cyanosis or erythema. Nails show no clubbing.  Psychiatric: She has a normal mood and affect. Her speech is normal and behavior is normal. Judgment and thought content normal.      Results for orders placed in visit on 11/10/13  POCT CBC      Result Value Range   WBC 9.3  4.6 - 10.2 K/uL   Lymph, poc 2.2  0.6 - 3.4   POC LYMPH PERCENT 23.2  10 - 50 %L   MID (cbc) 0.5  0 - 0.9   POC MID % 5.2  0 - 12 %M   POC Granulocyte 6.7  2 - 6.9   Granulocyte percent 71.6  37 - 80 %G   RBC 4.98  4.04 - 5.48 M/uL   Hemoglobin 15.7  12.2 - 16.2 g/dL   HCT, POC 16.1 (*) 09.6 - 47.9 %   MCV 99.3 (*) 80 - 97 fL   MCH, POC 31.5 (*) 27 - 31.2 pg   MCHC 31.7 (*) 31.8 - 35.4 g/dL   RDW, POC 04.5     Platelet Count, POC 227  142 - 424 K/uL   MPV 10.3  0 - 99.8 fL  POCT URINALYSIS DIPSTICK      Result Value Range   Color, UA yellow     Clarity, UA clear     Glucose, UA neg     Bilirubin, UA neg     Ketones, UA neg     Spec Grav, UA 1.015     Blood, UA trace     pH, UA 5.5     Protein, UA neg     Urobilinogen, UA 0.2     Nitrite, UA neg     Leukocytes, UA Negative    POCT UA - MICROSCOPIC ONLY      Result Value Range   WBC, Ur, HPF, POC neg     RBC, urine, microscopic 0-2     Bacteria, U Microscopic neg     Mucus, UA neg     Epithelial cells, urine per micros neg     Crystals, Ur, HPF, POC neg     Casts, Ur, LPF, POC neg  Yeast, UA neg     CXR: [facility] reading (PRIMARY) by  Dr. Conley Rolls.  Mild increased vascular markings.  No infiltrates or masses.      Assessment & Plan:  Breast lump /Breast pain- Plan: MM Digital Diagnostic Bilat  Cough - Plan: POCT CBC, DG Chest 2 View. Continue supportive care. If her symptoms worsen or persist, will treat, but  suspect this is post-viral and will resolve with time.  SVT (supraventricular tachycardia) - Plan: Lipid panel, POCT urinalysis dipstick, POCT UA - Microscopic Only  Generalized anxiety disorder - Stable. Continue current treatment. Plan: Comprehensive metabolic panel, TSH  Screening for cervical cancer - Plan: Pap IG and HPV (high risk) DNA detection  Fernande Bras, PA-C Physician Assistant-Certified [facility]

## 2013-11-10 NOTE — Patient Instructions (Signed)
I will contact you with your lab results as soon as they are available.   If you have not heard from me in 2 weeks, please contact me.  The fastest way to get your results is to register for My Chart (see the instructions on the last page of this printout).  Keeping You Healthy  Get These Tests 1. Blood Pressure- Have your blood pressure checked once a year by your health care provider.  Normal blood pressure is 120/80. 2. Weight- Have your body mass index (BMI) calculated to screen for obesity.  BMI is measure of body fat based on height and weight.  You can also calculate your own BMI at www.nhlbisupport.com/bmi/. 3. Cholesterol- Have your cholesterol checked every 5 years starting at age 20 then yearly starting at age 45. 4. Chlamydia, HIV, and other sexually transmitted diseases- Get screened every year until age 25, then within three months of each new sexual provider. 5. Pap Smear- Every 1-3 years; discuss with your health care provider. 6. Mammogram- Every year starting at age 40  Take these medicines  Calcium with Vitamin D-Your body needs 1200 mg of Calcium each day and 800-1000 IU of Vitamin D daily.  Your body can only absorb 500 mg of Calcium at a time so Calcium must be taken in 2 or 3 divided doses throughout the day.  Multivitamin with folic acid- Once daily if it is possible for you to become pregnant.  Get these Immunizations  Gardasil-Series of three doses; prevents HPV related illness such as genital warts and cervical cancer.  Menactra-Single dose; prevents meningitis.  Tetanus shot- Every 10 years.  Flu shot-Every year.  Take these steps 1. Do not smoke-Your healthcare provider can help you quit.  For tips on how to quit go to www.smokefree.gov or call 1-800 QUITNOW. 2. Be physically active- Exercise 5 days a week for at least 30 minutes.  If you are not already physically active, start slow and gradually work up to 30 minutes of moderate physical activity.   Examples of moderate activity include walking briskly, dancing, swimming, bicycling, etc. 3. Breast Cancer- A self breast exam every month is important for early detection of breast cancer.  For more information and instruction on self breast exams, ask your healthcare provider or www.womenshealth.gov/faq/breast-self-exam.cfm. 4. Eat a healthy diet- Eat a variety of healthy foods such as fruits, vegetables, whole grains, low fat milk, low fat cheeses, yogurt, lean meats, poultry and fish, beans, nuts, tofu, etc.  For more information go to www. Thenutritionsource.org 5. Drink alcohol in moderation- Limit alcohol intake to one drink or less per day. Never drink and drive. 6. Depression- Your emotional health is as important as your physical health.  If you're feeling down or losing interest in things you normally enjoy please talk to your healthcare provider about being screened for depression. 7. Dental visit- Brush and floss your teeth twice daily; visit your dentist twice a year. 8. Eye doctor- Get an eye exam at least every 2 years. 9. Helmet use- Always wear a helmet when riding a bicycle, motorcycle, rollerblading or skateboarding. 10. Safe sex- If you may be exposed to sexually transmitted infections, use a condom. 11. Seat belts- Seat belts can save your live; always wear one. 12. Smoke/Carbon Monoxide detectors- These detectors need to be installed on the appropriate level of your home. Replace batteries at least once a year. 13. Skin cancer- When out in the sun please cover up and use sunscreen 15 SPF or higher.   14. Violence- If anyone is threatening or hurting you, please tell your healthcare provider.        

## 2013-11-11 ENCOUNTER — Other Ambulatory Visit: Payer: Self-pay | Admitting: Physician Assistant

## 2013-11-11 DIAGNOSIS — N644 Mastodynia: Secondary | ICD-10-CM

## 2013-11-11 DIAGNOSIS — N63 Unspecified lump in unspecified breast: Secondary | ICD-10-CM

## 2013-11-11 LAB — PAP IG AND HPV HIGH-RISK

## 2013-11-12 ENCOUNTER — Encounter: Payer: Self-pay | Admitting: Family Medicine

## 2013-11-12 ENCOUNTER — Other Ambulatory Visit: Payer: Self-pay | Admitting: Radiology

## 2013-11-12 DIAGNOSIS — N644 Mastodynia: Secondary | ICD-10-CM

## 2013-11-17 ENCOUNTER — Other Ambulatory Visit: Payer: Self-pay

## 2013-11-17 ENCOUNTER — Other Ambulatory Visit: Payer: Self-pay | Admitting: Physician Assistant

## 2013-11-17 DIAGNOSIS — N63 Unspecified lump in unspecified breast: Secondary | ICD-10-CM

## 2013-11-17 DIAGNOSIS — N644 Mastodynia: Secondary | ICD-10-CM

## 2013-11-19 ENCOUNTER — Other Ambulatory Visit: Payer: Self-pay | Admitting: Family Medicine

## 2013-11-19 ENCOUNTER — Ambulatory Visit
Admission: RE | Admit: 2013-11-19 | Discharge: 2013-11-19 | Disposition: A | Discharge status: Home / Self Care | Payer: 59 | Source: Ambulatory Visit | Attending: Physician Assistant | Admitting: Physician Assistant

## 2013-11-19 DIAGNOSIS — N63 Unspecified lump in unspecified breast: Secondary | ICD-10-CM

## 2013-11-19 DIAGNOSIS — N644 Mastodynia: Secondary | ICD-10-CM

## 2014-08-23 ENCOUNTER — Ambulatory Visit (INDEPENDENT_AMBULATORY_CARE_PROVIDER_SITE_OTHER): Payer: 59 | Admitting: Internal Medicine

## 2014-08-23 VITALS — BP 118/76 | HR 87 | Temp 98.2°F | Resp 15 | Ht 64.0 in | Wt 231.6 lb

## 2014-08-23 DIAGNOSIS — F329 Major depressive disorder, single episode, unspecified: Secondary | ICD-10-CM

## 2014-08-23 DIAGNOSIS — F411 Generalized anxiety disorder: Secondary | ICD-10-CM

## 2014-08-23 DIAGNOSIS — F341 Dysthymic disorder: Secondary | ICD-10-CM

## 2014-08-23 MED ORDER — ALPRAZOLAM 0.5 MG PO TABS: 0.5000 mg | ORAL_TABLET | Freq: Three times a day (TID) | ORAL | Status: DC | PRN

## 2014-08-23 MED ORDER — SERTRALINE HCL 100 MG PO TABS: 100.0000 mg | ORAL_TABLET | Freq: Every day | ORAL | Status: DC

## 2014-08-24 NOTE — Progress Notes (Signed)
She is here to reestablish care after recently moving back to [facility] She is on Zoloft but this is no longer controlling her problems with depression and anxiety which have been made worse recently by the diagnosis of Ewing sarcoma in her 43 year old daughter--[facility]-stg 4 poor prog-started in the ankle but has multiple metastatic sites She has 19 and 10 year old kids who are healthy She and husband are at work caring for their daughter Understandably her symptoms are inhibiting activity and sleep  Patient Active Problem List   Diagnosis Date Noted  . SVT (supraventricular tachycardia) 11/10/2013  . Generalized anxiety disorder 11/10/2013   currently on 50 mg of Zoloft Has used benzodiazepines in the past but is currently out Having frequent anxiety attacks during the daytime   Exam BP 118/76  Pulse 87  Temp(Src) 98.2 F (36.8 C) (Oral)  Resp 15  Ht  (1.626 m)  Wt 231 lb 9.6 oz (105.053 kg)  BMI 39.73 kg/m2  SpO2 99%  LMP 08/22/2014 Crying during exam HEENT clear Heart regular Mood sad with appropriate affect/thought content realistic   Generalized anxiety disorder  Reactive depression  Meds ordered this encounter  Medications  . DISCONTD: sertraline (ZOLOFT) 50 MG tablet    Sig: Take 50 mg by mouth daily.  Marland Kitchen ALPRAZolam (XANAX) 0.5 MG tablet    Sig: Take 1 tablet (0.5 mg total) by mouth 3 (three) times daily as needed for anxiety.    Dispense:  90 tablet    Refill:  5  . sertraline (ZOLOFT) 100 MG tablet    Sig: Take 1 tablet (100 mg total) by mouth daily.    Dispense:  90 tablet    Refill:  3   Followup my chart

## 2015-11-08 ENCOUNTER — Ambulatory Visit (INDEPENDENT_AMBULATORY_CARE_PROVIDER_SITE_OTHER): Payer: No Typology Code available for payment source | Admitting: Internal Medicine

## 2015-11-08 VITALS — BP 126/85 | HR 103 | Temp 99.4°F | Resp 18 | Ht 64.0 in | Wt 234.2 lb

## 2015-11-08 DIAGNOSIS — Z23 Encounter for immunization: Secondary | ICD-10-CM

## 2015-11-08 DIAGNOSIS — F411 Generalized anxiety disorder: Secondary | ICD-10-CM | POA: Diagnosis not present

## 2015-11-08 MED ORDER — SERTRALINE HCL 100 MG PO TABS: 100.0000 mg | ORAL_TABLET | Freq: Every day | ORAL | Status: DC

## 2015-11-08 MED ORDER — ALPRAZOLAM 0.5 MG PO TABS: 0.5000 mg | ORAL_TABLET | Freq: Three times a day (TID) | ORAL | Status: DC | PRN

## 2015-11-08 NOTE — Progress Notes (Signed)
Subjective:  This chart was scribed for Tiffany Siaobert Doolittle, MD by Andrew Auaven Small, ED Scribe. This patient was seen in room 1 and the patient's care was started at 2:15 PM.   Patient ID: Tiffany [name], female    DOB: 05/26/1971, 44 y.o.   MRN: [id number]  HPI   Chief Complaint  Patient presents with  . Follow-up    Meds refill, zoloft  . Depression    see screening  . Flu Vaccine   HPI Comments: Tiffany Tiffany [name] is a 44 y.o. Female with hx of anxiety and depression, who presents to the [facility] for a medication refill of zoloft. She was last seen here 07/2014. She has been out of zoloft for about 1-2 weeks. She's been having withdrawal symptoms inluding nausea but has been taking xanax to suppress withdrawal symptoms.  Her 44 y/o daughter who was diagnosed with Ewing sarcoma has passed away. Both her and her husband have good and bad days but she feels they are handling things well. She is receiving counseling through hospice and her church. She recently had a grandchild and has another due in a couple of weeks which makes her happy. 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    2older kids married and live close--one new grandbaby and 1 on the way!!!!!! She'll be caretaker  Past Medical History  Diagnosis Date  . Allergy   . Anxiety   . Arthritis   . Depression   . DES exposure in utero    Allergies  Allergen Reactions  . Stadol [Butorphanol] Shortness Of Breath  . Other     Beta blockers exacerbate asthma    Prior to Admission medications   Medication Sig Start Date End Date Taking? Authorizing Provider  ALPRAZolam Prudy Feeler(XANAX) 0.5 MG tablet Take 1 tablet (0.5 mg total) by mouth 3 (three) times daily as needed for anxiety. 08/23/14  Yes Tonye Pearsonobert P Doolittle, MD  diltiazem (CARDIZEM LA) 180 MG 24 hr tablet Take 180 mg by mouth daily as needed (SVT).   Yes Historical Provider, MD  sertraline (ZOLOFT) 100 MG tablet Take 1 tablet (100 mg total) by  mouth daily. 08/23/14  Yes Tonye Pearsonobert P Doolittle, MD  DULoxetine (CYMBALTA) 60 MG capsule Take 60 mg by mouth daily.    Historical Provider, MD   Review of Systems  Psychiatric/Behavioral: Positive for dysphoric mood. The patient is nervous/anxious.    Objective:  Physical Exam  Constitutional: She appears well-developed and well-nourished. No distress.  HENT:  Head: Normocephalic.  Pulmonary/Chest: Effort normal.  Neurological: She is alert.  Psychiatric: She has a normal mood and affect. Her behavior is normal. Thought content normal.  Given the circumstances  Nursing note and vitals reviewed.   Filed Vitals:   11/08/15 1327  BP: 126/85  Pulse: 103  Temp: 99.4 F (37.4 C)  TempSrc: Oral  Resp: 18  Height: 5\' 4"  (1.626 m)  Weight: 234 lb 3.2 oz (106.232 kg)  SpO2: 98%   Assessment & Plan:   1. Generalized anxiety disorder  Grief reaction appropriate  2. Flu vaccine need ---decided to wait on [facility] due to expense here   To restart meds Focus on bmi this year Contin couns hospice and church Orders Placed This Encounter  Procedures  . Flu Vaccine QUAD 36+ mos IM    Meds ordered this encounter  Medications  . ALPRAZolam (XANAX) 0.5 MG tablet    Sig:  Take 1 tablet (0.5 mg total) by mouth 3 (three) times daily as needed for anxiety.    Dispense:  90 tablet    Refill:  5  . sertraline (ZOLOFT) 100 MG tablet    Sig: Take 1 tablet (100 mg total) by mouth daily.    Dispense:  90 tablet    Refill:  3   By signing my name below, I, Raven Small, attest that this documentation has been prepared under the direction and in the presence of Tiffany Sia, MD.  Electronically Signed: Andrew Au, ED Scribe. 11/08/2015. 2:30 PM.  I have completed the patient encounter in its entirety as documented by the scribe, with editing by me where necessary. Robert P. Merla Riches, M.D.

## 2015-11-22 ENCOUNTER — Encounter: Payer: Self-pay | Admitting: Internal Medicine

## 2016-04-26 ENCOUNTER — Telehealth: Payer: Self-pay

## 2016-04-26 DIAGNOSIS — Z87898 Personal history of other specified conditions: Secondary | ICD-10-CM

## 2016-04-26 NOTE — Telephone Encounter (Signed)
Patient states she was diagnosis with TIA by an Physical therapy provider and Pain Mgmt provider. She stated she had her first episode 3 weeks ago and another one this past Saturday. She is wanting to speak with Dr Merla Richesdoolittle if possible, she is concerned and has questions. Patient call back number is [phone].

## 2016-04-27 NOTE — Telephone Encounter (Signed)
She is concerned about 2 episodes she's had in recent weeks that involves being unable to speak clearly. She knows the word she wants to say that her articulation is disturbed. These episodes have lasted from 1-3 hours and resolved without treatment. There were no other associated neurological changes.  Her past history significant for generalized anxiety SVT and with the need for treatment with Zoloft and alprazolam for better control   She would like referral to neurology for further evaluation She will go to the emergency room for immediate imaging if this occurs again

## 2016-10-03 ENCOUNTER — Ambulatory Visit (INDEPENDENT_AMBULATORY_CARE_PROVIDER_SITE_OTHER): Payer: No Typology Code available for payment source | Admitting: Physician Assistant

## 2016-10-03 VITALS — BP 140/82 | HR 96 | Temp 98.3°F | Resp 18 | Ht 64.0 in | Wt 223.0 lb

## 2016-10-03 DIAGNOSIS — L723 Sebaceous cyst: Secondary | ICD-10-CM | POA: Diagnosis not present

## 2016-10-03 DIAGNOSIS — F411 Generalized anxiety disorder: Secondary | ICD-10-CM

## 2016-10-03 DIAGNOSIS — L089 Local infection of the skin and subcutaneous tissue, unspecified: Secondary | ICD-10-CM

## 2016-10-03 MED ORDER — DULOXETINE HCL 60 MG PO CPEP: 60.0000 mg | ORAL_CAPSULE | Freq: Every day | ORAL | 5 refills | Status: AC

## 2016-10-03 MED ORDER — ALPRAZOLAM 0.5 MG PO TABS: 0.5000 mg | ORAL_TABLET | Freq: Every day | ORAL | 0 refills | Status: AC | PRN

## 2016-10-03 MED ORDER — SULFAMETHOXAZOLE-TRIMETHOPRIM 800-160 MG PO TABS: 1.0000 | ORAL_TABLET | Freq: Two times a day (BID) | ORAL | 0 refills | Status: AC

## 2016-10-03 NOTE — Patient Instructions (Addendum)
  Please keep the wound covered.  You can take showers, but then change the dressing directly afterwards. I would like you to return in 2 days for recheck I will give you 2 additional refills of the xanax. Incision and Drainage, Care After Refer to this sheet in the next few weeks. These instructions provide you with information on caring for yourself after your procedure. Your caregiver may also give you more specific instructions. Your treatment has been planned according to current medical practices, but problems sometimes occur. Call your caregiver if you have any problems or questions after your procedure. HOME CARE INSTRUCTIONS   If antibiotic medicine is given, take it as directed. Finish it even if you start to feel better.  Only take over-the-counter or prescription medicines for pain, discomfort, or fever as directed by your caregiver.  Keep all follow-up appointments as directed by your caregiver.  Change any bandages (dressings) as directed by your caregiver. Replace old dressings with clean dressings.  Wash your hands before and after caring for your wound. You will receive specific instructions for cleansing and caring for your wound.  SEEK MEDICAL CARE IF:   You have increased pain, swelling, or redness around the wound.  You have increased drainage, smell, or bleeding from the wound.  You have muscle aches, chills, or you feel generally sick.  You have a fever. MAKE SURE YOU:   Understand these instructions.  Will watch your condition.  Will get help right away if you are not doing well or get worse.   This information is not intended to replace advice given to you by your health care provider. Make sure you discuss any questions you have with your health care provider.   Document Released: 03/04/2012 Document Revised: 01/01/2015 Document Reviewed: 03/04/2012 Elsevier Interactive Patient Education [employer]2016 Elsevier Inc.     IF you received an x-ray today, you  will receive an invoice from [facility]. Please contact [facility] at [phone] with questions or concerns regarding your invoice.   IF you received labwork today, you will receive an invoice from [facility]. Please contact [facility] at [phone] with questions or concerns regarding your invoice.   Our billing staff will not be able to assist you with questions regarding bills from these companies.  You will be contacted with the lab results as soon as they are available. The fastest way to get your results is to activate your My Chart account. Instructions are located on the last page of this paperwork. If you have not heard from us regarding the results in 2 weeks, please contact this office.

## 2016-10-03 NOTE — Progress Notes (Signed)
Patient ID: Tiffany Regesara L Lei, female   DOB: 07/17/1971, 45 y.o.   MRN: [id number] [facility] [address], [place] [id number] [phone]  By signing my name below, I, Essence Howell, attest that this documentation has been prepared under the direction and in the presence of Trena PlattStephanie English, PA-C Electronically Signed: Charline BillsEssence Howell, ED Scribe 10/03/2016 at 4:30 PM.   Date:  10/03/2016   Name:  Tiffany [name]   DOB:  01/13/1971   MRN:  [id number]  PCP:  Tonye PearsonOLITTLE, ROBERT P, MD   History of Present Illness:  Tiffany [name] is a 45 y.o. female patient who [facility] medication refill of Cymbalta and Xanax. Pt states that Dr. Merla Richesoolittle started pt on Zoloft years ago. She was switched to 60 mg Cymbalta for CRPS and when she lost her 45 y.o daughter to CA on 07/25/15. Pt states that she had to stop the medication when she lost her insurance but requests a reduces swelling, tremors and aching pain to 3/10. She has tried gabapentin and attending [place] CRPS clinic in [place], [place] without relief of symptoms. She states that she takes Xanax for anxiety and panic attacks which are accompanied with hyperventilating and chest palpitations. Pt states that a 30 day bottle of Xanax typically lasts her 1 year. She was seen by a hospice counselor following her daughter's death but has not sought any other therapy. She denies h/o SI or depression. Pt reports rare alcohol consumption; stating that she only has 1-2 alcoholic beverages per year for the holidays.   Pt also presents with a gradually worsening tender, non-drainage mass to the posterior right shoulder. Pt states that she first noticed the area several weeks ago but it has worsened over the past 4 days. No treatments tried PTA. She states that she had a similar cyst in the same area in 2013 that required packing and lancing.   Patient Active Problem List   Diagnosis Date Noted   SVT (supraventricular tachycardia) (HCC)  11/10/2013   Generalized anxiety disorder 11/10/2013    Past Medical History:  Diagnosis Date   Allergy    Anxiety    Arthritis    CRPS (complex regional pain syndrome type I)    Depression    DES exposure in utero     Past Surgical History:  Procedure Laterality Date   CESAREAN SECTION     x2   CHOLECYSTECTOMY     SALPINGOSTOMY     due to ectopic pregnancy    Social History  Substance Use Topics   Smoking status: Former Smoker    Packs/day: 1.00    Years: 15.00    Types: Cigarettes    Quit date: 12/24/2001   Smokeless tobacco: Never Used   Alcohol use Not on file     Comment: a couple of times/year    Family History  Problem Relation Age of Onset   Heart disease Mother    Hyperlipidemia Mother    Hypertension Mother    Mental illness Mother    COPD Mother    Mental illness Father     bipolar   Hyperlipidemia Sister    Hypertension Sister    Supraventricular tachycardia Sister    Hypertension Brother    Cancer Maternal Grandmother    Heart disease Maternal Grandmother    Hyperlipidemia Maternal Grandmother    Hypertension Maternal Grandmother    Stroke Maternal Grandmother    Supraventricular tachycardia Sister  Arthritis Daughter     JRA   Arthritis Daughter     JRA    Allergies  Allergen Reactions   Stadol [Butorphanol] Shortness Of Breath   Other     Beta blockers exacerbate asthma     Medication list has been reviewed and updated.  Current Outpatient Prescriptions on File Prior to Visit  Medication Sig Dispense Refill   ALPRAZolam (XANAX) 0.5 MG tablet Take 1 tablet (0.5 mg total) by mouth 3 (three) times daily as needed for anxiety. 90 tablet 5   diltiazem (CARDIZEM LA) 180 MG 24 hr tablet Take 180 mg by mouth daily as needed (SVT).     sertraline (ZOLOFT) 100 MG tablet Take 1 tablet (100 mg total) by mouth daily. (Patient not taking: Reported on 10/03/2016) 90 tablet 3   No current  facility-administered medications on file prior to visit.     Review of Systems  Skin:       +mass to posterior R shoulder  Psychiatric/Behavioral: Negative for depression and suicidal ideas.    Physical Examination: BP 140/82 (BP Location: Right Arm, Patient Position: Sitting, Cuff Size: Small)    Pulse 96    Temp 98.3 F (36.8 C) (Oral)    Resp 18    Ht 5\' 4"  (1.626 m)    Wt 223 lb (101.2 kg)    LMP 10/02/2016    SpO2 98%    BMI 38.28 kg/m  Ideal Body Weight: @FLOWAMB ([id number])@  Physical Exam  Constitutional: She is oriented to person, place, and time. She appears well-developed and well-nourished. No distress.  HENT:  Head: Normocephalic and atraumatic.  Right Ear: External ear normal.  Left Ear: External ear normal.  Eyes: Conjunctivae and EOM are normal. Pupils are equal, round, and reactive to light.  Cardiovascular: Normal rate.   Pulmonary/Chest: Effort normal. No respiratory distress.  Neurological: She is alert and oriented to person, place, and time.  Skin: She is not diaphoretic.  Fluctuant nodule at the R scapula with mild erythema bordering. Very minimally tender.  Psychiatric: She has a normal mood and affect. Her behavior is normal.   Procedure: Verbal consent obtained. Procedure site was cleansed with alcohol swapping. 2% lidocaine was placed in to the mass on her right upper back/shoulder blade. Anesthesia obtained. Wound was cleansed with povidone-iodine swabs. 11 blade was utilized to place a 1 cm incision of the lesion. Sebaceous material heavily expressed. The sac was then excised in pieces. Wound was irrigated with normal saline. Half packing was placed aggressively at this time.  Wound site cleansed with normal saline.  Dressings applied.  Assessment and Plan: Tiffany [name] is a 45 y.o. female who is here today for cc of bump on back, and refill of medication.  We will leave the wound site open for possible infection. I'm treating her with Bactrim at this  time. Advised return in 2 days for follow-up and changing of packing. I think it is very likely that she will not have a long follow-up with [place]. We will refill the Cymbalta at this time. This is been working well with her CRPS as well as her anxiety and we will encourage this use of an SSRI. I have also refilled her Xanax. Patient does not take it often and use is warranted at this time.  We may refill it for 2 additional refills within the year. Generalized anxiety disorder  Infected sebaceous cyst  Trena Platt, PA-C [facility] 10/03/2016  4:29 PM

## 2016-10-05 ENCOUNTER — Ambulatory Visit (INDEPENDENT_AMBULATORY_CARE_PROVIDER_SITE_OTHER): Payer: 59 | Admitting: Physician Assistant

## 2016-10-05 VITALS — BP 124/88 | HR 80 | Temp 98.4°F | Resp 17 | Ht 64.0 in | Wt 224.0 lb

## 2016-10-05 DIAGNOSIS — Z5189 Encounter for other specified aftercare: Secondary | ICD-10-CM

## 2016-10-05 NOTE — Patient Instructions (Addendum)
Change dressing if dressing is soiled. Keep covered at all times except when showering. You may get wet in the shower but do not scrub.  Apply warm compresses/heating pad to facilitate drainage. Leave packing in place. Do not apply any ointments as this may delay healing. If an antibiotic was prescribed, take as directed until finished. Return in 48 hours for wound care.   Thank you for coming in today. I hope you feel we met your needs.  Feel free to call [facility] if you have any questions or further requests.  Please consider signing up for MyChart if you do not already have it, as this is a great way to communicate with me.  Best,  Whitney McVey, PA-C    IF you received an x-ray today, you will receive an invoice from [facility]. Please contact [facility] at [phone] with questions or concerns regarding your invoice.   IF you received labwork today, you will receive an invoice from [employer]. Please contact [facility] at [phone] with questions or concerns regarding your invoice.   Our billing staff will not be able to assist you with questions regarding bills from these companies.  You will be contacted with the lab results as soon as they are available. The fastest way to get your results is to activate your My Chart account. Instructions are located on the last page of this paperwork. If you have not heard from [place] regarding the results in 2 weeks, please contact this office.

## 2016-10-05 NOTE — Progress Notes (Signed)
   Tiffany [name]  MRN: [id number] DOB: 08/02/1971  PCP: Tonye PearsonOLITTLE, ROBERT P, MD  Subjective:  Pt is a 45 year old, history of anxiety and CRP right arm, who presents to clinic for follow-up wound care. She was here two days ago and had a cyst drained and packed. Is taking antibiotics as prescribed.   Denies fever, chills.   Review of Systems  Constitutional: Negative.   Cardiovascular: Negative.   Musculoskeletal: Negative.   Skin: Positive for wound. Negative for color change.    Patient Active Problem List   Diagnosis Date Noted  . SVT (supraventricular tachycardia) (HCC) 11/10/2013  . Generalized anxiety disorder 11/10/2013    Current Outpatient Prescriptions on File Prior to Visit  Medication Sig Dispense Refill  . ALPRAZolam (XANAX) 0.5 MG tablet Take 1 tablet (0.5 mg total) by mouth daily as needed for anxiety. 30 tablet 0  . diltiazem (CARDIZEM LA) 180 MG 24 hr tablet Take 180 mg by mouth daily as needed (SVT).    . DULoxetine (CYMBALTA) 60 MG capsule Take 1 capsule (60 mg total) by mouth daily. 30 capsule 5  . sulfamethoxazole-trimethoprim (BACTRIM DS,SEPTRA DS) 800-160 MG tablet Take 1 tablet by mouth 2 (two) times daily. 14 tablet 0   No current facility-administered medications on file prior to visit.     Allergies  Allergen Reactions  . Stadol [Butorphanol] Shortness Of Breath  . Other     Beta blockers exacerbate asthma     Objective:  BP 124/88 (BP Location: Right Arm, Patient Position: Sitting, Cuff Size: Large)   Pulse 80   Temp 98.4 F (36.9 C) (Oral)   Resp 17   Ht 5\' 4"  (1.626 m)   Wt 224 lb (101.6 kg)   LMP 10/02/2016   SpO2 99%   BMI 38.45 kg/m   Physical Exam  Constitutional: She is oriented to person, place, and time and well-developed, well-nourished, and in no distress. No distress.  Cardiovascular: Normal rate, regular rhythm and normal heart sounds.   Pulmonary/Chest: Effort normal. No respiratory distress.  Neurological: She is alert  and oriented to person, place, and time. GCS score is 15.  Skin: Skin is warm and dry.     1 cm incision posterior right shoulder. Packing in place. Wound appears to be healing well. Mild TTP. No erythema, drainage appreciated.    Psychiatric: Mood, memory, affect and judgment normal.  Vitals reviewed.  Procedure: Verbal consent obtained. Packing removed and wound irrigated with 5cc lidocaine. No purulent material was expressed. Wound cavity size explored with loculations. Wound packed with 1/4 inch packing. Wound dressed and wound care discussed.  Assessment and Plan :  1. Encounter for wound care - Wound care discussed with patient. RTC in 48 hours for wound care. She understands and agrees.    Marco CollieWhitney Malijah Lietz, PA-C  [facility] 10/05/2016 5:46 PM

## 2016-10-09 ENCOUNTER — Ambulatory Visit: Payer: Self-pay

## 2017-02-01 ENCOUNTER — Emergency Department (HOSPITAL_COMMUNITY): Payer: Self-pay

## 2017-02-01 ENCOUNTER — Emergency Department (HOSPITAL_COMMUNITY)
Admission: EM | Admit: 2017-02-01 | Discharge: 2017-02-01 | Disposition: A | Discharge status: Home / Self Care | Payer: Self-pay | Attending: Emergency Medicine | Admitting: Emergency Medicine

## 2017-02-01 ENCOUNTER — Encounter (HOSPITAL_COMMUNITY): Payer: Self-pay | Admitting: *Deleted

## 2017-02-01 DIAGNOSIS — Z79899 Other long term (current) drug therapy: Secondary | ICD-10-CM | POA: Insufficient documentation

## 2017-02-01 DIAGNOSIS — J40 Bronchitis, not specified as acute or chronic: Secondary | ICD-10-CM | POA: Insufficient documentation

## 2017-02-01 DIAGNOSIS — F17210 Nicotine dependence, cigarettes, uncomplicated: Secondary | ICD-10-CM | POA: Insufficient documentation

## 2017-02-01 HISTORY — DX: Supraventricular tachycardia, unspecified: I47.10

## 2017-02-01 HISTORY — DX: Supraventricular tachycardia: I47.1

## 2017-02-01 LAB — BASIC METABOLIC PANEL
ANION GAP: 12 (ref 5–15)
BUN: 7 mg/dL (ref 6–20)
CALCIUM: 8.7 mg/dL — AB (ref 8.9–10.3)
CO2: 19 mmol/L — ABNORMAL LOW (ref 22–32)
Chloride: 104 mmol/L (ref 101–111)
Creatinine, Ser: 0.79 mg/dL (ref 0.44–1.00)
GFR calc Af Amer: >60 mL/min (ref >60)
GLUCOSE: 105 mg/dL — AB (ref 65–99)
Potassium: 3.3 mmol/L — ABNORMAL LOW (ref 3.5–5.1)
Sodium: 135 mmol/L (ref 135–145)

## 2017-02-01 LAB — CBC WITH DIFFERENTIAL/PLATELET
BASOS ABS: 0 10*3/uL (ref 0.0–0.1)
BASOS PCT: 0 %
EOS PCT: 1 %
Eosinophils Absolute: 0.1 10*3/uL (ref 0.0–0.7)
HCT: 44.1 % (ref 36.0–46.0)
Hemoglobin: 15.6 g/dL — ABNORMAL HIGH (ref 12.0–15.0)
LYMPHS PCT: 9 %
Lymphs Abs: 0.5 10*3/uL — ABNORMAL LOW (ref 0.7–4.0)
MCH: 32 pg (ref 26.0–34.0)
MCHC: 35.4 g/dL (ref 30.0–36.0)
MCV: 90.4 fL (ref 78.0–100.0)
MONO ABS: 0.5 10*3/uL (ref 0.1–1.0)
Monocytes Relative: 8 %
Neutro Abs: 4.8 10*3/uL (ref 1.7–7.7)
Neutrophils Relative %: 82 %
PLATELETS: 182 10*3/uL (ref 150–400)
RBC: 4.88 MIL/uL (ref 3.87–5.11)
RDW: 12.9 % (ref 11.5–15.5)
WBC: 5.9 10*3/uL (ref 4.0–10.5)

## 2017-02-01 MED ORDER — ALBUTEROL SULFATE HFA 108 (90 BASE) MCG/ACT IN AERS
2.0000 | INHALATION_SPRAY | Freq: Once | RESPIRATORY_TRACT | Status: AC
  Administered 2017-02-01: 2 via RESPIRATORY_TRACT
  Filled 2017-02-01: qty 6.7

## 2017-02-01 MED ORDER — ACETAMINOPHEN 325 MG PO TABS
650.0000 mg | ORAL_TABLET | Freq: Once | ORAL | Status: AC
  Administered 2017-02-01: 650 mg via ORAL
  Filled 2017-02-01: qty 2

## 2017-02-01 MED ORDER — SODIUM CHLORIDE 0.9 % IV BOLUS (SEPSIS)
1000.0000 mL | Freq: Once | INTRAVENOUS | Status: AC
  Administered 2017-02-01: 1000 mL via INTRAVENOUS

## 2017-02-01 MED ORDER — IPRATROPIUM-ALBUTEROL 0.5-2.5 (3) MG/3ML IN SOLN
3.0000 mL | RESPIRATORY_TRACT | Status: DC
  Administered 2017-02-01: 3 mL via RESPIRATORY_TRACT
  Filled 2017-02-01: qty 3

## 2017-02-01 MED ORDER — ALBUTEROL SULFATE (2.5 MG/3ML) 0.083% IN NEBU: 2.5000 mg | INHALATION_SOLUTION | RESPIRATORY_TRACT | 1 refills | Status: DC | PRN

## 2017-02-01 MED ORDER — OSELTAMIVIR PHOSPHATE 75 MG PO CAPS
75.0000 mg | ORAL_CAPSULE | Freq: Once | ORAL | Status: AC
  Administered 2017-02-01: 75 mg via ORAL
  Filled 2017-02-01: qty 1

## 2017-02-01 MED ORDER — AZITHROMYCIN 250 MG PO TABS: 250.0000 mg | ORAL_TABLET | Freq: Every day | ORAL | 0 refills | Status: DC

## 2017-02-01 MED ORDER — OSELTAMIVIR PHOSPHATE 75 MG PO CAPS: 75.0000 mg | ORAL_CAPSULE | Freq: Two times a day (BID) | ORAL | 0 refills | Status: DC

## 2017-02-01 MED ORDER — IPRATROPIUM-ALBUTEROL 0.5-2.5 (3) MG/3ML IN SOLN
3.0000 mL | Freq: Once | RESPIRATORY_TRACT | Status: AC
  Administered 2017-02-01: 3 mL via RESPIRATORY_TRACT
  Filled 2017-02-01: qty 3

## 2017-02-01 MED ORDER — PREDNISONE 20 MG PO TABS: ORAL_TABLET | ORAL | 0 refills | Status: DC

## 2017-02-01 MED ORDER — KETOROLAC TROMETHAMINE 30 MG/ML IJ SOLN
15.0000 mg | Freq: Once | INTRAMUSCULAR | Status: AC
  Administered 2017-02-01: 15 mg via INTRAVENOUS
  Filled 2017-02-01: qty 1

## 2017-02-01 MED ORDER — PREDNISONE 10 MG PO TABS
60.0000 mg | ORAL_TABLET | Freq: Once | ORAL | Status: AC
  Administered 2017-02-01: 17:00:00 60 mg via ORAL
  Filled 2017-02-01: qty 1

## 2017-02-01 NOTE — ED Provider Notes (Signed)
[facility]-EMERGENCY DEPT Provider Note   CSN: [id number] Arrival date & time: 02/01/17  1557     History   Chief Complaint Chief Complaint  Patient presents with  . Shortness of Breath    HPI Tiffany [name] is a 46 y.o. female.   Cough  This is a new problem. The cough is productive of sputum. There has been no fever. Associated symptoms include myalgias. Pertinent negatives include no chest pain. She has tried nothing for the symptoms. The treatment provided mild relief. She is not a smoker. Her past medical history is significant for bronchitis and pneumonia.    Past Medical History:  Diagnosis Date  . Allergy   . Anxiety   . Arthritis   . CRPS (complex regional pain syndrome type I)   . Depression   . DES exposure in utero   . SVT (supraventricular tachycardia) [facility](HCC)     Patient Active Problem List   Diagnosis Date Noted  . SVT (supraventricular tachycardia) (HCC) 11/10/2013  . Generalized anxiety disorder 11/10/2013    Past Surgical History:  Procedure Laterality Date  . CESAREAN SECTION     x2  . CHOLECYSTECTOMY    . SALPINGOSTOMY     due to ectopic pregnancy    OB History    No data available       Home Medications    Prior to Admission medications   Medication Sig Start Date End Date Taking? Authorizing Provider  acetaminophen (TYLENOL) 500 MG tablet Take 500 mg by mouth every 6 (six) hours as needed for mild pain or moderate pain.   Yes Historical Provider, MD  ALPRAZolam Prudy Feeler(XANAX) 0.5 MG tablet Take 1 tablet (0.5 mg total) by mouth daily as needed for anxiety. 10/03/16  Yes Stephanie D English, PA  diltiazem (CARDIZEM LA) 180 MG 24 hr tablet Take 180 mg by mouth daily as needed (SVT).   Yes Historical Provider, MD  DULoxetine (CYMBALTA) 60 MG capsule Take 1 capsule (60 mg total) by mouth daily. 10/03/16  Yes Stephanie D English, PA  albuterol (PROVENTIL) (2.5 MG/3ML) 0.083% nebulizer solution Take 3 mLs (2.5 mg total) by nebulization every 4 (four) hours  as needed for wheezing or shortness of breath. 02/01/17   Marily MemosJason Adanely Reynoso, MD  azithromycin (ZITHROMAX) 250 MG tablet Take 1 tablet (250 mg total) by mouth daily. Take first 2 tablets together, then 1 every day until finished. 02/01/17   Marily MemosJason Saige Busby, MD  oseltamivir (TAMIFLU) 75 MG capsule Take 1 capsule (75 mg total) by mouth every 12 (twelve) hours. 02/01/17   Marily MemosJason Josefina Rynders, MD  predniSONE (DELTASONE) 20 MG tablet 2 tabs po daily x 4 days 02/01/17   Marily MemosJason Liah Morr, MD    Family History Family History  Problem Relation Age of Onset  . Heart disease Mother   . Hyperlipidemia Mother   . Hypertension Mother   . Mental illness Mother   . COPD Mother   . Mental illness Father     bipolar  . Hyperlipidemia Sister   . Hypertension Sister   . Supraventricular tachycardia Sister   . Hypertension Brother   . Cancer Maternal Grandmother   . Heart disease Maternal Grandmother   . Hyperlipidemia Maternal Grandmother   . Hypertension Maternal Grandmother   . Stroke Maternal Grandmother   . Supraventricular tachycardia Sister   . Arthritis Daughter     JRA  . Arthritis Daughter     JRA    Social History Social History  Substance Use Topics  .  Smoking status: Current Every Day Smoker    Packs/day: 0.50    Years: 15.00    Types: Cigarettes    Last attempt to quit: 12/24/2001  . Smokeless tobacco: Never Used  . Alcohol use Yes     Comment: rarely     Allergies   Contrast media [iodinated diagnostic agents]; Stadol [butorphanol]; and Other   Review of Systems Review of Systems  Constitutional: Positive for fever.  Respiratory: Positive for cough.   Cardiovascular: Negative for chest pain.  Musculoskeletal: Positive for arthralgias, myalgias and neck pain.  All other systems reviewed and are negative.    Physical Exam Updated Vital Signs BP 110/61   Pulse 91   Temp 98.2 F (36.8 C) (Oral)   Resp 17   Ht 5' 3.5" (1.613 m)   Wt 215 lb (97.5 kg)   LMP 01/11/2017 (Approximate)    SpO2 100%   BMI 37.49 kg/m   Physical Exam  Constitutional: She appears well-developed and well-nourished.  HENT:  Head: Normocephalic and atraumatic.  Neck: Normal range of motion.  Cardiovascular: Normal rate and regular rhythm.   Pulmonary/Chest: No accessory muscle usage or stridor. Tachypnea noted. No respiratory distress. She has decreased breath sounds. She has wheezes. She has no rhonchi. She has no rales.  Abdominal: She exhibits no distension.  Neurological: She is alert.  Nursing note and vitals reviewed.    ED Treatments / Results  Labs (all labs ordered are listed, but only abnormal results are displayed) Labs Reviewed  CBC WITH DIFFERENTIAL/PLATELET - Abnormal; Notable for the following:       Result Value   Hemoglobin 15.6 (*)    Lymphs Abs 0.5 (*)    All other components within normal limits  BASIC METABOLIC PANEL - Abnormal; Notable for the following:    Potassium 3.3 (*)    CO2 19 (*)    Glucose, Bld 105 (*)    Calcium 8.7 (*)    All other components within normal limits    EKG  EKG Interpretation  Date/Time:  Thursday February 01 2017 16:33:50 EST Ventricular Rate:  106 PR Interval:    QRS Duration: 90 QT Interval:  356 QTC Calculation: 473 R Axis:   43 Text Interpretation:  Sinus tachycardia Biatrial enlargement Low voltage, precordial leads Baseline wander in lead(s) II III aVF V2 V3 V4 V5 V6 Technically poor tracing Confirmed by [facility] MD, Barbara Cower [id number]) on 02/01/2017 5:33:15 PM       Radiology Dg Chest 2 View  Result Date: 02/01/2017 CLINICAL DATA:  Shortness of breath.  Cough. EXAM: CHEST  2 VIEW COMPARISON:  November 10, 2013 FINDINGS: A pectus excavatum is identified. This results in partial obscuration of the right heart border, unchanged since February 2009. The cardiomediastinal silhouette is normal. No pneumothorax. No nodules or masses. No focal infiltrates. IMPRESSION: No active cardiopulmonary disease. Electronically Signed   By: Gerome Sam III M.D   On: 02/01/2017 17:29    Procedures Procedures (including critical care time)  Medications Ordered in ED Medications  ipratropium-albuterol (DUONEB) 0.5-2.5 (3) MG/3ML nebulizer solution 3 mL (3 mLs Nebulization Given 02/01/17 1932)  ipratropium-albuterol (DUONEB) 0.5-2.5 (3) MG/3ML nebulizer solution 3 mL (3 mLs Nebulization Given 02/01/17 1744)  acetaminophen (TYLENOL) tablet 650 mg (650 mg Oral Given 02/01/17 1652)  sodium chloride 0.9 % bolus 1,000 mL (0 mLs Intravenous Stopped 02/01/17 1800)  predniSONE (DELTASONE) tablet 60 mg (60 mg Oral Given 02/01/17 1653)  oseltamivir (TAMIFLU) capsule 75 mg (75 mg Oral  Given 02/01/17 1916)  ketorolac (TORADOL) 30 MG/ML injection 15 mg (15 mg Intravenous Given 02/01/17 1915)  albuterol (PROVENTIL HFA;VENTOLIN HFA) 108 (90 Base) MCG/ACT inhaler 2 puff (2 puffs Inhalation Given 02/01/17 2117)     Initial Impression / Assessment and Plan / ED Course  I have reviewed the triage vital signs and the nursing notes.  Pertinent labs & imaging results that were available during my care of the patient were reviewed by me and considered in my medical decision making (see chart for details).     Suspect reactive airway disease with component of influenza. No obvious pneumonia on exam or XR. Will treat supportively. No indication for antibiotcs currently.   Improved significantly while in ER. No respiratory distress.  Likely bronchitis v influenza. Will treat for both. Stable for dc w/ pcp follow up.   Final Clinical Impressions(s) / ED Diagnoses   Final diagnoses:  Bronchitis    New Prescriptions Discharge Medication List as of 02/01/2017  9:27 PM    START taking these medications   Details  albuterol (PROVENTIL) (2.5 MG/3ML) 0.083% nebulizer solution Take 3 mLs (2.5 mg total) by nebulization every 4 (four) hours as needed for wheezing or shortness of breath., Starting Thu 02/01/2017, Print    azithromycin (ZITHROMAX) 250 MG tablet Take 1 tablet  (250 mg total) by mouth daily. Take first 2 tablets together, then 1 every day until finished., Starting Thu 02/01/2017, Print    oseltamivir (TAMIFLU) 75 MG capsule Take 1 capsule (75 mg total) by mouth every 12 (twelve) hours., Starting Thu 02/01/2017, Print    predniSONE (DELTASONE) 20 MG tablet 2 tabs po daily x 4 days, Print         Marily Memos, MD 02/01/17 [phone]

## 2017-02-01 NOTE — ED Notes (Signed)
EDP at the bedside.  ?

## 2017-02-01 NOTE — ED Notes (Signed)
Pt going to xray  

## 2017-02-01 NOTE — ED Triage Notes (Signed)
Pt c/o productive cough, right sided "lung pain", fever up to 102 that started last night. Pt reports hx of PNA several times and says this feels similar.

## 2017-07-03 ENCOUNTER — Telehealth: Payer: Self-pay | Admitting: Physician Assistant

## 2017-07-03 NOTE — Telephone Encounter (Signed)
PATIENT IS REQUESTING A REFILL ON HER DULOXETINE (CYMBALTA) 60 MG. SHE SAID SHE HAS BEEN OUT FOR A WEEK AND IS STARTING TO HAVE WITHDRAWALS. BEST PHONE [phone] (CELL) PHARMACY CHOICE IS [facility] IN [place]. MBC

## 2017-07-03 NOTE — Telephone Encounter (Signed)
LMOM pt needs to sche an OV last seen 10/03/16, Rx was given w/5 refills. Pt advised she may be given a refill until her appt., has to call to schedule.

## 2017-07-05 NOTE — Telephone Encounter (Signed)
Appt scheduled for 7/16.

## 2017-07-09 ENCOUNTER — Ambulatory Visit: Payer: Self-pay | Admitting: Physician Assistant

## 2018-04-03 ENCOUNTER — Encounter: Payer: Self-pay | Admitting: Physician Assistant

## 2018-04-25 ENCOUNTER — Emergency Department (HOSPITAL_COMMUNITY)
Admission: EM | Admit: 2018-04-25 | Discharge: 2018-04-25 | Disposition: A | Discharge status: Home / Self Care | Payer: Self-pay | Attending: Emergency Medicine | Admitting: Emergency Medicine

## 2018-04-25 ENCOUNTER — Emergency Department (HOSPITAL_COMMUNITY): Payer: Self-pay

## 2018-04-25 ENCOUNTER — Other Ambulatory Visit: Payer: Self-pay

## 2018-04-25 ENCOUNTER — Encounter (HOSPITAL_COMMUNITY): Payer: Self-pay

## 2018-04-25 DIAGNOSIS — J4521 Mild intermittent asthma with (acute) exacerbation: Secondary | ICD-10-CM | POA: Insufficient documentation

## 2018-04-25 DIAGNOSIS — Z79899 Other long term (current) drug therapy: Secondary | ICD-10-CM | POA: Insufficient documentation

## 2018-04-25 DIAGNOSIS — F419 Anxiety disorder, unspecified: Secondary | ICD-10-CM | POA: Insufficient documentation

## 2018-04-25 DIAGNOSIS — Z9049 Acquired absence of other specified parts of digestive tract: Secondary | ICD-10-CM | POA: Insufficient documentation

## 2018-04-25 DIAGNOSIS — J209 Acute bronchitis, unspecified: Secondary | ICD-10-CM

## 2018-04-25 DIAGNOSIS — R059 Cough, unspecified: Secondary | ICD-10-CM

## 2018-04-25 DIAGNOSIS — Z720 Tobacco use: Secondary | ICD-10-CM

## 2018-04-25 DIAGNOSIS — R05 Cough: Secondary | ICD-10-CM

## 2018-04-25 DIAGNOSIS — F329 Major depressive disorder, single episode, unspecified: Secondary | ICD-10-CM | POA: Insufficient documentation

## 2018-04-25 DIAGNOSIS — R509 Fever, unspecified: Secondary | ICD-10-CM

## 2018-04-25 DIAGNOSIS — F17210 Nicotine dependence, cigarettes, uncomplicated: Secondary | ICD-10-CM | POA: Insufficient documentation

## 2018-04-25 DIAGNOSIS — J40 Bronchitis, not specified as acute or chronic: Secondary | ICD-10-CM | POA: Insufficient documentation

## 2018-04-25 LAB — TROPONIN I: Troponin I: <0.03 ng/mL (ref <0.03)

## 2018-04-25 LAB — CBC WITH DIFFERENTIAL/PLATELET
BASOS ABS: 0 10*3/uL (ref 0.0–0.1)
BASOS PCT: 0 %
Eosinophils Absolute: 0.1 10*3/uL (ref 0.0–0.7)
Eosinophils Relative: 3 %
HEMATOCRIT: 45.7 % (ref 36.0–46.0)
HEMOGLOBIN: 15.3 g/dL — AB (ref 12.0–15.0)
LYMPHS PCT: 21 %
Lymphs Abs: 1.1 10*3/uL (ref 0.7–4.0)
MCH: 31.2 pg (ref 26.0–34.0)
MCHC: 33.5 g/dL (ref 30.0–36.0)
MCV: 93.3 fL (ref 78.0–100.0)
MONO ABS: 0.7 10*3/uL (ref 0.1–1.0)
Monocytes Relative: 14 %
NEUTROS ABS: 3.1 10*3/uL (ref 1.7–7.7)
NEUTROS PCT: 62 %
Platelets: 164 10*3/uL (ref 150–400)
RBC: 4.9 MIL/uL (ref 3.87–5.11)
RDW: 13.1 % (ref 11.5–15.5)
WBC: 4.9 10*3/uL (ref 4.0–10.5)

## 2018-04-25 LAB — URINALYSIS, ROUTINE W REFLEX MICROSCOPIC
Bilirubin Urine: NEGATIVE
GLUCOSE, UA: NEGATIVE mg/dL
Ketones, ur: NEGATIVE mg/dL
LEUKOCYTES UA: NEGATIVE
NITRITE: NEGATIVE
PH: 5 (ref 5.0–8.0)
Protein, ur: NEGATIVE mg/dL
Specific Gravity, Urine: 1.024 (ref 1.005–1.030)

## 2018-04-25 LAB — BASIC METABOLIC PANEL
ANION GAP: 9 (ref 5–15)
BUN: 9 mg/dL (ref 6–20)
CO2: 21 mmol/L — ABNORMAL LOW (ref 22–32)
Calcium: 8.6 mg/dL — ABNORMAL LOW (ref 8.9–10.3)
Chloride: 108 mmol/L (ref 101–111)
Creatinine, Ser: 0.65 mg/dL (ref 0.44–1.00)
GLUCOSE: 115 mg/dL — AB (ref 65–99)
POTASSIUM: 3.6 mmol/L (ref 3.5–5.1)
Sodium: 138 mmol/L (ref 135–145)

## 2018-04-25 LAB — PREGNANCY, URINE: PREG TEST UR: NEGATIVE

## 2018-04-25 MED ORDER — PREDNISONE 10 MG (21) PO TBPK: ORAL_TABLET | Freq: Every day | ORAL | 0 refills | Status: DC

## 2018-04-25 MED ORDER — IPRATROPIUM-ALBUTEROL 0.5-2.5 (3) MG/3ML IN SOLN
3.0000 mL | Freq: Once | RESPIRATORY_TRACT | Status: AC
  Administered 2018-04-25: 3 mL via RESPIRATORY_TRACT
  Filled 2018-04-25: qty 3

## 2018-04-25 MED ORDER — AZITHROMYCIN 250 MG PO TABS
500.0000 mg | ORAL_TABLET | Freq: Once | ORAL | Status: AC
  Administered 2018-04-25: 500 mg via ORAL
  Filled 2018-04-25: qty 2

## 2018-04-25 MED ORDER — AEROCHAMBER PLUS FLO-VU MEDIUM MISC
1.0000 | Freq: Once | Status: DC
  Filled 2018-04-25 (×2): qty 1

## 2018-04-25 MED ORDER — ALBUTEROL SULFATE HFA 108 (90 BASE) MCG/ACT IN AERS
1.0000 | INHALATION_SPRAY | RESPIRATORY_TRACT | Status: DC | PRN
  Filled 2018-04-25: qty 6.7

## 2018-04-25 MED ORDER — ALBUTEROL SULFATE (2.5 MG/3ML) 0.083% IN NEBU: 2.5000 mg | INHALATION_SOLUTION | RESPIRATORY_TRACT | 1 refills | Status: DC | PRN

## 2018-04-25 MED ORDER — AZITHROMYCIN 250 MG PO TABS: ORAL_TABLET | ORAL | 0 refills | Status: DC

## 2018-04-25 MED ORDER — METHYLPREDNISOLONE SODIUM SUCC 125 MG IJ SOLR
125.0000 mg | Freq: Once | INTRAMUSCULAR | Status: AC
  Administered 2018-04-25: 125 mg via INTRAVENOUS
  Filled 2018-04-25: qty 2

## 2018-04-25 NOTE — ED Triage Notes (Signed)
Pt reports sob, cough, congestion x 4 days, has had a fever intermittently as well, tightness in chest.

## 2018-04-25 NOTE — Discharge Instructions (Signed)
Try to stop smoking. °

## 2018-04-25 NOTE — ED Provider Notes (Signed)
[facility] EMERGENCY DEPARTMENT Provider Note   CSN: [id number] Arrival date & time: 04/25/18  4098     History   Chief Complaint Chief Complaint  Patient presents with  . Shortness of Breath    cough    HPI Tiffany [name] is a 47 y.o. female.  Pt presents to the ED today with SOB.  The pt said she's had cough, congestion, and sob for the past few days.  She has had intermittent fevers.  Last fever 2 days ago.  She does smoke, but has not smoked since she became ill.  She has a nebulizer machine, but no medicine for it.  She denies n/v or abd pain.     Past Medical History:  Diagnosis Date  . Allergy   . Anxiety   . Arthritis   . CRPS (complex regional pain syndrome type I)   . Depression   . DES exposure in utero   . SVT (supraventricular tachycardia) [facility])     Patient Active Problem List   Diagnosis Date Noted  . SVT (supraventricular tachycardia) (HCC) 11/10/2013  . Generalized anxiety disorder 11/10/2013    Past Surgical History:  Procedure Laterality Date  . CESAREAN SECTION     x2  . CHOLECYSTECTOMY    . SALPINGOSTOMY     due to ectopic pregnancy     OB History   None      Home Medications    Prior to Admission medications   Medication Sig Start Date End Date Taking? Authorizing Provider  acetaminophen (TYLENOL) 500 MG tablet Take 500 mg by mouth every 6 (six) hours as needed for mild pain or moderate pain.    [provider]  albuterol (PROVENTIL) (2.5 MG/3ML) 0.083% nebulizer solution Take 3 mLs (2.5 mg total) by nebulization every 4 (four) hours as needed for wheezing or shortness of breath. 04/25/18   Jacalyn Lefevre, MD  ALPRAZolam Prudy Feeler) 0.5 MG tablet Take 1 tablet (0.5 mg total) by mouth daily as needed for anxiety. 10/03/16   Trena Platt D, PA  azithromycin (ZITHROMAX) 250 MG tablet Take  1 every day until finished. 04/26/18   Jacalyn Lefevre, MD  diltiazem (CARDIZEM LA) 180 MG 24 hr tablet Take 180 mg by mouth daily as needed  (SVT).    [provider]  DULoxetine (CYMBALTA) 60 MG capsule Take 1 capsule (60 mg total) by mouth daily. 10/03/16   Trena Platt D, PA  oseltamivir (TAMIFLU) 75 MG capsule Take 1 capsule (75 mg total) by mouth every 12 (twelve) hours. 02/01/17   Mesner, Barbara Cower, MD  predniSONE (STERAPRED UNI-PAK 21 TAB) 10 MG (21) TBPK tablet Take by mouth daily. Take 6 tabs by mouth daily  for 2 days, then 5 tabs for 2 days, then 4 tabs for 2 days, then 3 tabs for 2 days, 2 tabs for 2 days, then 1 tab by mouth daily for 2 days 04/25/18   Jacalyn Lefevre, MD    Family History Family History  Problem Relation Age of Onset  . Heart disease Mother   . Hyperlipidemia Mother   . Hypertension Mother   . Mental illness Mother   . COPD Mother   . Mental illness Father        bipolar  . Hyperlipidemia Sister   . Hypertension Sister   . Supraventricular tachycardia Sister   . Hypertension Brother   . Cancer Maternal Grandmother   . Heart disease Maternal Grandmother   . Hyperlipidemia Maternal Grandmother   .  Hypertension Maternal Grandmother   . Stroke Maternal Grandmother   . Supraventricular tachycardia Sister   . Arthritis Daughter        JRA  . Arthritis Daughter        JRA    Social History Social History   Tobacco Use  . Smoking status: Current Every Day Smoker    Packs/day: 0.50    Years: 15.00    Pack years: 7.50    Types: Cigarettes    Last attempt to quit: 12/24/2001    Years since quitting: 16.3  . Smokeless tobacco: Never Used  Substance Use Topics  . Alcohol use: Yes    Comment: rarely  . Drug use: No    Types: MDMA (Ecstacy)     Allergies   Contrast media [iodinated diagnostic agents]; Stadol [butorphanol]; and Other   Review of Systems Review of Systems  Constitutional: Positive for fever.  Respiratory: Positive for cough, shortness of breath and wheezing.   All other systems reviewed and are negative.    Physical Exam Updated Vital Signs BP 129/72    Pulse 83   Temp 98.1 F (36.7 C) (Oral)   Resp 20   Ht 5' 3.5" (1.613 m)   Wt 90.7 kg (200 lb)   LMP 04/21/2018   SpO2 97%   BMI 34.87 kg/m   Physical Exam  Constitutional: She is oriented to person, place, and time. She appears well-developed and well-nourished.  HENT:  Head: Normocephalic and atraumatic.  Mouth/Throat: Oropharynx is clear and moist.  Eyes: Pupils are equal, round, and reactive to light. EOM are normal.  Neck: Normal range of motion. Neck supple.  Cardiovascular: Normal rate and regular rhythm.  Pulmonary/Chest: Tachypnea noted. She has wheezes.  Abdominal: Soft. Bowel sounds are normal.  Musculoskeletal: Normal range of motion.       Right lower leg: Normal.       Left lower leg: Normal.  Neurological: She is alert and oriented to person, place, and time.  Skin: Skin is warm and dry. Capillary refill takes less than 2 seconds.  Psychiatric: She has a normal mood and affect. Her behavior is normal.  Nursing note and vitals reviewed.    ED Treatments / Results  Labs (all labs ordered are listed, but only abnormal results are displayed) Labs Reviewed  CBC WITH DIFFERENTIAL/PLATELET - Abnormal; Notable for the following components:      Result Value   Hemoglobin 15.3 (*)    All other components within normal limits  URINALYSIS, ROUTINE W REFLEX MICROSCOPIC - Abnormal; Notable for the following components:   APPearance HAZY (*)    Hgb urine dipstick LARGE (*)    Bacteria, UA RARE (*)    All other components within normal limits  PREGNANCY, URINE  BASIC METABOLIC PANEL  TROPONIN I    EKG EKG Interpretation  Date/Time:  Thursday Apr 25 2018 06:37:41 EDT Ventricular Rate:  91 PR Interval:    QRS Duration: 89 QT Interval:  383 QTC Calculation: 472 R Axis:   8 Text Interpretation:  Sinus rhythm Left atrial enlargement Low voltage, precordial leads Nonspecific T abnrm, anterolateral leads Baseline wander No significant change since last tracing 01 Feb 2017 Confirmed by Devoria Albe ([id number]) on 04/25/2018 6:40:26 AM   Radiology Dg Chest 2 View  Result Date: 04/25/2018 CLINICAL DATA:  47 year old female with shortness of breath cough and congestion for 4 days. Fever. Smoker. EXAM: CHEST - 2 VIEW COMPARISON:  02/01/2017 and earlier FINDINGS: Lung volumes are stable  at the upper limits of normal. Mediastinal contours are stable and normal. Visualized tracheal air column is within normal limits. Chronic basilar predominant increased interstitial markings. No pneumothorax, pleural effusion, confluent or acute pulmonary opacity. No pulmonary edema suspected. No acute osseous abnormality identified. Negative visible bowel gas pattern. IMPRESSION: Chronic pulmonary interstitial changes likely related to smoking. No acute cardiopulmonary abnormality. Electronically Signed   By: Odessa Fleming M.D.   On: 04/25/2018 07:21    Procedures Procedures (including critical care time)  Medications Ordered in ED Medications  albuterol (PROVENTIL HFA;VENTOLIN HFA) 108 (90 Base) MCG/ACT inhaler 1-2 puff (has no administration in time range)  AEROCHAMBER PLUS FLO-VU MEDIUM MISC 1 each (has no administration in time range)  azithromycin (ZITHROMAX) tablet 500 mg (has no administration in time range)  ipratropium-albuterol (DUONEB) 0.5-2.5 (3) MG/3ML nebulizer solution 3 mL (3 mLs Nebulization Given 04/25/18 0729)  methylPREDNISolone sodium succinate (SOLU-MEDROL) 125 mg/2 mL injection 125 mg (125 mg Intravenous Given 04/25/18 0719)     Initial Impression / Assessment and Plan / ED Course  I have reviewed the triage vital signs and the nursing notes.  Pertinent labs & imaging results that were available during my care of the patient were reviewed by me and considered in my medical decision making (see chart for details).    Breathing has much improved.  She is able to take deep breaths.  She is encouraged to stop smoking.  She knows to return if worse.    Final Clinical  Impressions(s) / ED Diagnoses   Final diagnoses:  Cough  Fever  Acute bronchitis, unspecified organism  Mild intermittent reactive airway disease with acute exacerbation  Tobacco abuse    ED Discharge Orders        Ordered    azithromycin (ZITHROMAX) 250 MG tablet     04/25/18 0820    predniSONE (STERAPRED UNI-PAK 21 TAB) 10 MG (21) TBPK tablet  Daily     04/25/18 0820    albuterol (PROVENTIL) (2.5 MG/3ML) 0.083% nebulizer solution  Every 4 hours PRN     04/25/18 0820       Jacalyn Lefevre, MD 04/25/18 [phone]

## 2018-07-05 ENCOUNTER — Encounter (HOSPITAL_COMMUNITY): Payer: Self-pay | Admitting: Emergency Medicine

## 2018-07-05 ENCOUNTER — Emergency Department (HOSPITAL_COMMUNITY)

## 2018-07-05 ENCOUNTER — Emergency Department (HOSPITAL_COMMUNITY)
Admission: EM | Admit: 2018-07-05 | Discharge: 2018-07-05 | Disposition: A | Discharge status: Home / Self Care | Attending: Emergency Medicine | Admitting: Emergency Medicine

## 2018-07-05 ENCOUNTER — Other Ambulatory Visit: Payer: Self-pay

## 2018-07-05 DIAGNOSIS — M25512 Pain in left shoulder: Secondary | ICD-10-CM | POA: Insufficient documentation

## 2018-07-05 DIAGNOSIS — Z87891 Personal history of nicotine dependence: Secondary | ICD-10-CM | POA: Diagnosis not present

## 2018-07-05 DIAGNOSIS — Z79899 Other long term (current) drug therapy: Secondary | ICD-10-CM | POA: Insufficient documentation

## 2018-07-05 MED ORDER — NAPROXEN 250 MG PO TABS
500.0000 mg | ORAL_TABLET | Freq: Once | ORAL | Status: AC
  Administered 2018-07-05: 500 mg via ORAL
  Filled 2018-07-05: qty 2

## 2018-07-05 MED ORDER — NAPROXEN 500 MG PO TABS: 500.0000 mg | ORAL_TABLET | Freq: Two times a day (BID) | ORAL | 0 refills | Status: DC

## 2018-07-05 MED ORDER — HYDROCODONE-ACETAMINOPHEN 5-325 MG PO TABS: 1.0000 | ORAL_TABLET | ORAL | 0 refills | Status: DC | PRN

## 2018-07-05 NOTE — ED Triage Notes (Signed)
Pt here for workers comp. Pt works at [employer] and was lifting trays. Pt c/o pain to LT shoulder that worsens with movement and radiates into neck and down arm. No deformity noted.

## 2018-07-05 NOTE — Discharge Instructions (Addendum)
As discussed, your injury is suspicious for a rotator cuff injury (tear to one of the muscles or tendons of your shoulder joint muscle group.  Your xray is negative for broken bones or dislocation.  Wear the shoulder sling for comfort.  Apply ice to your shoulder as much as possible for the next several days.  Use the medicines prescribed for inflammation and pain relief.  You may take the hydrocodone prescribed for pain relief.  This will make you drowsy - do not drive within 4 hours of taking this medication.

## 2018-07-05 NOTE — ED Provider Notes (Signed)
[facility] EMERGENCY DEPARTMENT Provider Note   CSN: [id number] Arrival date & time: 07/05/18  1321     History   Chief Complaint Chief Complaint  Patient presents with  . Shoulder Injury    HPI Tiffany [name] is a 47 y.o. 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  She reports pain radiates into her mid upper arm with movement.  She reports the shoulder joint feels full.  Initially she was able to forward flex and abduct her forearm but any overhead work was very painful, she currently has pain with any movement of the shoulder joint.  She denies weakness or numbness in her arms hands or fingers.  She has had no treatment prior to arrival.  She denies prior injury to the shoulder.  She states her husband has had a recent rotator cuff surgery by Dr. Shelle IronBeane in [place].  The history is provided by the patient.    Past Medical History:  Diagnosis Date  . Allergy   . Anxiety   . Arthritis   . CRPS (complex regional pain syndrome type I)   . Depression   . DES exposure in utero   . SVT (supraventricular tachycardia) [facility](HCC)     Patient Active Problem List   Diagnosis Date Noted  . SVT (supraventricular tachycardia) (HCC) 11/10/2013  . Generalized anxiety disorder 11/10/2013    Past Surgical History:  Procedure Laterality Date  . CESAREAN SECTION     x2  . CHOLECYSTECTOMY    . SALPINGOSTOMY     due to ectopic pregnancy     OB History   None      Home Medications    Prior to Admission medications   Medication Sig Start Date End Date Taking? Authorizing Provider  acetaminophen (TYLENOL) 500 MG tablet Take 500 mg by mouth every 6 (six) hours as needed for mild pain or moderate pain.    [provider]  albuterol (PROVENTIL) (2.5  MG/3ML) 0.083% nebulizer solution Take 3 mLs (2.5 mg total) by nebulization every 4 (four) hours as needed for wheezing or shortness of breath. 04/25/18   Jacalyn LefevreHaviland, Ashlay Altieri, MD  ALPRAZolam Prudy Feeler(XANAX) 0.5 MG tablet Take 1 tablet (0.5 mg total) by mouth daily as needed for anxiety. 10/03/16   Trena PlattEnglish, Stephanie D, PA  azithromycin (ZITHROMAX) 250 MG tablet Take  1 every day until finished. 04/26/18   Jacalyn LefevreHaviland, Hettie Roselli, MD  diltiazem (CARDIZEM LA) 180 MG 24 hr tablet Take 180 mg by mouth daily as needed (SVT).    [provider]  DULoxetine (CYMBALTA) 60 MG capsule Take 1 capsule (60 mg total) by mouth daily. 10/03/16   Trena PlattEnglish, Stephanie D, PA  HYDROcodone-acetaminophen (NORCO/VICODIN) 5-325 MG tablet Take 1 tablet by mouth every 4 (four) hours as needed. 07/05/18   Burgess AmorIdol, Sylena Lotter, PA-C  naproxen (NAPROSYN) 500 MG tablet Take 1 tablet (500 mg total) by mouth 2 (two) times daily. 07/05/18   Burgess AmorIdol, Caswell Alvillar, PA-C  oseltamivir (TAMIFLU) 75 MG capsule Take 1 capsule (75 mg total) by mouth every 12 (twelve) hours. 02/01/17   Mesner, Barbara CowerJason, MD  predniSONE (STERAPRED UNI-PAK 21 TAB) 10 MG (21) TBPK tablet Take by mouth daily. Take 6 tabs by mouth daily  for 2 days, then 5 tabs for 2 days, then 4 tabs for  2 days, then 3 tabs for 2 days, 2 tabs for 2 days, then 1 tab by mouth daily for 2 days 04/25/18   Jacalyn Lefevre, MD    Family History Family History  Problem Relation Age of Onset  . Heart disease Mother   . Hyperlipidemia Mother   . Hypertension Mother   . Mental illness Mother   . COPD Mother   . Mental illness Father        bipolar  . Hyperlipidemia Sister   . Hypertension Sister   . Supraventricular tachycardia Sister   . Hypertension Brother   . Cancer Maternal Grandmother   . Heart disease Maternal Grandmother   . Hyperlipidemia Maternal Grandmother   . Hypertension Maternal Grandmother   . Stroke Maternal Grandmother   . Supraventricular tachycardia Sister   . Arthritis Daughter        JRA  .  Arthritis Daughter        JRA    Social History Social History   Tobacco Use  . Smoking status: Former Smoker    Packs/day: 0.50    Years: 15.00    Pack years: 7.50    Types: Cigarettes  . Smokeless tobacco: Never Used  . Tobacco comment: quit in May 2019  Substance Use Topics  . Alcohol use: Yes    Comment: rarely  . Drug use: No    Types: MDMA (Ecstacy)     Allergies   Contrast media [iodinated diagnostic agents]; Stadol [butorphanol]; and Other   Review of Systems Review of Systems  Constitutional: Negative for fever.  Musculoskeletal: Positive for arthralgias and joint swelling. Negative for myalgias.  Neurological: Negative for weakness and numbness.     Physical Exam Updated Vital Signs BP (!) 144/97 (BP Location: Right Arm)   Pulse 83   Temp 98.6 F (37 C) (Oral)   Resp 16   Ht 5' 3.5" (1.613 m)   Wt 89.8 kg (198 lb)   LMP 07/01/2018 (Exact Date)   SpO2 98%   BMI 34.52 kg/m   Physical Exam  Constitutional: She appears well-developed and well-nourished.  HENT:  Head: Atraumatic.  Neck: Normal range of motion.  Cardiovascular:  Pulses:      Radial pulses are 2+ on the right side, and 2+ on the left side.  Pulses equal bilaterally  Musculoskeletal: She exhibits tenderness. She exhibits no deformity.       Left shoulder: She exhibits tenderness, bony tenderness and pain. She exhibits no swelling, no spasm, normal pulse and normal strength.  ttp anterior and lateral left shoulder.  Exam is limited secondary to pain.  Active and passive range of motion are equally painful with forward flexion and abduction.  She has no pain to palpation of the forearm elbow or humerus.  She has tenderness to palpation at her left deltoid and her anterior humeral head.  There is no palpable soft tissue deformities.  She can flex and extend her left fingers wrist and elbow without eliciting pain.  Neurological: She is alert. She has normal strength. She displays normal  reflexes. No sensory deficit.  Skin: Skin is warm and dry.  Psychiatric: She has a normal mood and affect.     ED Treatments / Results  Labs (all labs ordered are listed, but only abnormal results are displayed) Labs Reviewed - No data to display  EKG None  Radiology Dg Shoulder Left  Result Date: 07/05/2018 CLINICAL DATA:  LEFT shoulder pain, worsened with movement. Pain radiates into neck and  down arm to elbow today. EXAM: LEFT SHOULDER - 2+ VIEW COMPARISON:  None. FINDINGS: There is no evidence of fracture or dislocation. There is no evidence of arthropathy or other focal bone abnormality. Soft tissues are unremarkable. IMPRESSION: Negative. Electronically Signed   By: Bary Richard M.D.   On: 07/05/2018 14:11    Procedures Procedures (including critical care time)  Medications Ordered in ED Medications  naproxen (NAPROSYN) tablet 500 mg (has no administration in time range)     Initial Impression / Assessment and Plan / ED Course  I have reviewed the triage vital signs and the nursing notes.  Pertinent labs & imaging results that were available during my care of the patient were reviewed by me and considered in my medical decision making (see chart for details).     Imaging reviewed and discussed with patient.  I suspect she may have a rotator tear.  She was placed in a sling, discussed rest, ice.  She was placed on naproxen, also prescribed hydrocodone.  [place] narcotic database was reviewed prior to this prescription.  She was referred to Dr. Romeo Apple our local orthopedist on call.  Also given Dr.Beane's information as she expressed interest in seeing her husband's orthopedist.  Advised close follow-up with either orthopedist for further evaluation and management.  Final Clinical Impressions(s) / ED Diagnoses   Final diagnoses:  Acute pain of left shoulder    ED Discharge Orders        Ordered    naproxen (NAPROSYN) 500 MG tablet  2 times daily      07/05/18 1513    HYDROcodone-acetaminophen (NORCO/VICODIN) 5-325 MG tablet  Every 4 hours PRN     07/05/18 1513       Burgess Amor, PA-C 07/05/18 1525    Eber Hong, MD 07/06/18 [phone]

## 2020-07-03 ENCOUNTER — Ambulatory Visit: Payer: Self-pay

## 2020-07-10 ENCOUNTER — Ambulatory Visit: Payer: Self-pay

## 2020-07-15 ENCOUNTER — Ambulatory Visit: Payer: Self-pay

## 2020-07-17 ENCOUNTER — Ambulatory Visit: Payer: Self-pay | Attending: Internal Medicine

## 2020-07-17 DIAGNOSIS — Z23 Encounter for immunization: Secondary | ICD-10-CM

## 2020-07-17 NOTE — Progress Notes (Signed)
   Covid-19 Vaccination Clinic  Name:  Tiffany [name]    MRN: [id number] DOB: [birth year]  07/17/2020  Tiffany [name] was observed post Covid-19 immunization for 15 minutes without incident. She was provided with Vaccine Information Sheet and instruction to access the V-Safe system.   Tiffany [name] was instructed to call 911 with any severe reactions post vaccine: Marland Kitchen Difficulty breathing  . Swelling of face and throat  . A fast heartbeat  . A bad rash all over body  . Dizziness and weakness   Immunizations Administered    Name Date Dose VIS Date Route   Pfizer COVID-19 Vaccine 07/17/2020 11:26 AM 0.3 mL 02/18/2019 Intramuscular   Manufacturer: [employer], [employer]   Lot: [id number]   NDC: [address]-2

## 2020-08-12 ENCOUNTER — Ambulatory Visit: Payer: Self-pay | Attending: Internal Medicine

## 2020-08-12 DIAGNOSIS — Z23 Encounter for immunization: Secondary | ICD-10-CM

## 2020-08-12 NOTE — Progress Notes (Signed)
   Covid-19 Vaccination Clinic  Name:  Tiffany [name]    MRN: [id number] DOB: Dec 02, 1971  08/12/2020  [name] was observed post Covid-19 immunization for 30 minutes based on pre-vaccination screening without incident. She was provided with Vaccine Information Sheet and instruction to access the V-Safe system.   [name] was instructed to call 911 with any severe reactions post vaccine: Marland Kitchen Difficulty breathing  . Swelling of face and throat  . A fast heartbeat  . A bad rash all over body  . Dizziness and weakness   Immunizations Administered    Name Date Dose VIS Date Route   Pfizer COVID-19 Vaccine 08/12/2020 10:35 AM 0.3 mL 02/18/2019 Intramuscular   Manufacturer: [employer], [employer]   Lot: [id number]   NDC: [address]-2      Covid-19 Vaccination Clinic  Name:  Tiffany [name]    MRN: [id number] DOB: June 09, 1971  08/12/2020  [name] was observed post Covid-19 immunization for 15 minutes without incident. She was provided with Vaccine Information Sheet and instruction to access the V-Safe system.   [name] was instructed to call 911 with any severe reactions post vaccine: Marland Kitchen Difficulty breathing  . Swelling of face and throat  . A fast heartbeat  . A bad rash all over body  . Dizziness and weakness   Immunizations Administered    Name Date Dose VIS Date Route   Pfizer COVID-19 Vaccine 08/12/2020 10:35 AM 0.3 mL 02/18/2019 Intramuscular   Manufacturer: [employer], [employer]   Lot: [id number]   NDC: [address]-1

## 2020-11-22 ENCOUNTER — Emergency Department (HOSPITAL_COMMUNITY): Payer: Self-pay

## 2020-11-22 ENCOUNTER — Emergency Department (HOSPITAL_COMMUNITY)
Admission: EM | Admit: 2020-11-22 | Discharge: 2020-11-22 | Disposition: A | Discharge status: Home / Self Care | Payer: Self-pay | Attending: Emergency Medicine | Admitting: Emergency Medicine

## 2020-11-22 ENCOUNTER — Emergency Department (HOSPITAL_COMMUNITY)
Admit: 2020-11-22 | Discharge: 2020-11-22 | Disposition: A | Discharge status: Home / Self Care | Payer: Self-pay | Attending: Emergency Medicine | Admitting: Emergency Medicine

## 2020-11-22 ENCOUNTER — Encounter (HOSPITAL_COMMUNITY): Payer: Self-pay

## 2020-11-22 ENCOUNTER — Other Ambulatory Visit: Payer: Self-pay

## 2020-11-22 DIAGNOSIS — R569 Unspecified convulsions: Secondary | ICD-10-CM | POA: Insufficient documentation

## 2020-11-22 DIAGNOSIS — Z20822 Contact with and (suspected) exposure to covid-19: Secondary | ICD-10-CM | POA: Insufficient documentation

## 2020-11-22 LAB — BASIC METABOLIC PANEL
Anion gap: 8 (ref 5–15)
BUN: 8 mg/dL (ref 6–20)
CO2: 26 mmol/L (ref 22–32)
Calcium: 8.8 mg/dL — ABNORMAL LOW (ref 8.9–10.3)
Chloride: 103 mmol/L (ref 98–111)
Creatinine, Ser: 0.81 mg/dL (ref 0.44–1.00)
GFR, Estimated: >60 mL/min (ref >60)
Glucose, Bld: 103 mg/dL — ABNORMAL HIGH (ref 70–99)
Potassium: 3.6 mmol/L (ref 3.5–5.1)
Sodium: 137 mmol/L (ref 135–145)

## 2020-11-22 LAB — CBC WITH DIFFERENTIAL/PLATELET
Abs Immature Granulocytes: 0.04 10*3/uL (ref 0.00–0.07)
Basophils Absolute: 0 10*3/uL (ref 0.0–0.1)
Basophils Relative: 1 %
Eosinophils Absolute: 0.3 10*3/uL (ref 0.0–0.5)
Eosinophils Relative: 4 %
HCT: 43 % (ref 36.0–46.0)
Hemoglobin: 14.6 g/dL (ref 12.0–15.0)
Immature Granulocytes: 1 %
Lymphocytes Relative: 22 %
Lymphs Abs: 1.9 10*3/uL (ref 0.7–4.0)
MCH: 31.6 pg (ref 26.0–34.0)
MCHC: 34 g/dL (ref 30.0–36.0)
MCV: 93.1 fL (ref 80.0–100.0)
Monocytes Absolute: 0.6 10*3/uL (ref 0.1–1.0)
Monocytes Relative: 7 %
Neutro Abs: 5.7 10*3/uL (ref 1.7–7.7)
Neutrophils Relative %: 65 %
Platelets: 213 10*3/uL (ref 150–400)
RBC: 4.62 MIL/uL (ref 3.87–5.11)
RDW: 12.6 % (ref 11.5–15.5)
WBC: 8.6 10*3/uL (ref 4.0–10.5)
nRBC: 0 % (ref 0.0–0.2)

## 2020-11-22 LAB — MAGNESIUM: Magnesium: 2.2 mg/dL (ref 1.7–2.4)

## 2020-11-22 LAB — RESP PANEL BY RT-PCR (FLU A&B, COVID) ARPGX2
Influenza A by PCR: NEGATIVE
Influenza B by PCR: NEGATIVE
SARS Coronavirus 2 by RT PCR: NEGATIVE

## 2020-11-22 MED ORDER — LORAZEPAM 2 MG/ML IJ SOLN
1.0000 mg | Freq: Once | INTRAMUSCULAR | Status: DC
  Administered 2020-11-22: 1 mg via INTRAVENOUS
  Filled 2020-11-22: qty 1

## 2020-11-22 NOTE — ED Provider Notes (Signed)
Care assumed for Dr. Rhunette Croft at shift change. If EEG negative, d/c with f/u with neurology Physical Exam  BP (!) 161/104   Pulse 60   Temp 98.2 F (36.8 C) (Oral)   Resp (!) 22   Ht 5\' 3"  (1.6 m)   Wt 104.3 kg   LMP 11/08/2020   SpO2 100%   BMI 40.74 kg/m   Physical Exam  ED Course/Procedures     Procedures  MDM  EEG without acute seizure. Will discharge with planned follow-up with neurology. Ambulatory referral given for [facility]. Seizure precautions of no driving or other dangerous activities included in discharge instructions.       11/10/2020, MD 11/22/20 1719

## 2020-11-22 NOTE — ED Triage Notes (Signed)
Patient reports that she was diagnosed with covid-19 14 days ago.  Patient states she was worked up a year ago for [employer] in [place]. Patient states she moved and did not follow up.  Patient states,  "It starts out with a wave over my head, I can not move and then I become rigid. I am aware of what is going on, but am unable to move or talk." Event happened today and is the same symptoms I had a year ago.  Patient also reports pressrued speech that started this AM.

## 2020-11-22 NOTE — Procedures (Signed)
Patient Name: Tiffany [name]  MRN: [id number]  Epilepsy Attending: Charlsie Quest  Referring Physician/Provider: Dr Kem Boroughs Date: 11/22/2020  Duration: 26 mins  Patient history: 49 yo F with episode of inability to move, feel rigid, unable to talk. EEG to evaluate for seizure.  Level of alertness: Awake  AEDs during EEG study: None  Technical aspects: This EEG study was done with scalp electrodes positioned according to the 10-20 International system of electrode placement. Electrical activity was acquired at a sampling rate of 500Hz  and reviewed with a high frequency filter of 70Hz  and a low frequency filter of 1Hz . EEG data were recorded continuously and digitally stored.   Description: The posterior dominant rhythm consists of 10 Hz activity of moderate voltage (25-35 uV) seen predominantly in posterior head regions, symmetric and reactive to eye opening and eye closing. Physiologic photic driving was seen during photic stimulation.  Hyperventilation was not performed.     IMPRESSION: This study is within normal limits. No seizures or epileptiform discharges were seen throughout the recording.  Tiffany [name] 

## 2020-11-22 NOTE — Progress Notes (Signed)
EEG completed, results pending. 

## 2020-11-22 NOTE — Discharge Instructions (Addendum)
1. The EEG done in the emergency department at this time does not show evidence of a seizure. You are instructed to follow-up with neurology as soon as possible. You have been given a referral to [facility]. Please call tomorrow to schedule an appointment. 2. No driving or any dangerous activities that could result in injury to yourself or others if you had a seizure until further evaluated by neurology and cleared to drive by neurology.

## 2020-11-22 NOTE — ED Notes (Signed)
EEG tech paged

## 2020-12-22 NOTE — ED Provider Notes (Signed)
[facility]-EMERGENCY DEPT Provider Note   CSN: [id number] Arrival date & time: 11/22/20  0920     History Chief Complaint  Patient presents with  . Neurologic Problem    Tiffany [name] is a 49 y.o. female.  HPI     49 year old female comes in a chief complaint of seizure-like activity.  Patient was diagnosed with COVID-19 4 days ago.  Her current visit is not related to COVID-19.  She reports that she was being worked up for [employer] in [place], but she was lost to follow-up because she moved.  Over the past few days she has had several episodes where she has an aura like a wave coming over her head followed by her becoming rigid.  She is aware of what is going on but has a blank stare.  She is unable to talk during the episode.  Her symptoms really have been off and on for the last year, but the episodes are getting more frequent.  No history of seizures.  Patient denies any new medications.  Past Medical History:  Diagnosis Date  . Allergy   . Anxiety   . Arthritis   . CRPS (complex regional pain syndrome type I)   . Depression   . DES exposure in utero   . SVT (supraventricular tachycardia) [facility])     Patient Active Problem List   Diagnosis Date Noted  . SVT (supraventricular tachycardia) (HCC) 11/10/2013  . Generalized anxiety disorder 11/10/2013    Past Surgical History:  Procedure Laterality Date  . CESAREAN SECTION     x2  . CHOLECYSTECTOMY    . SALPINGOSTOMY     due to ectopic pregnancy     OB History   No obstetric history on file.     Family History  Problem Relation Age of Onset  . Heart disease Mother   . Hyperlipidemia Mother   . Hypertension Mother   . Mental illness Mother   . COPD Mother   . Mental illness Father        bipolar  . Hyperlipidemia Sister   . Hypertension Sister   . Supraventricular tachycardia Sister   . Hypertension Brother   . Cancer Maternal Grandmother   . Heart disease Maternal Grandmother   .  Hyperlipidemia Maternal Grandmother   . Hypertension Maternal Grandmother   . Stroke Maternal Grandmother   . Supraventricular tachycardia Sister   . Arthritis Daughter        JRA  . Arthritis Daughter        JRA    Social History   Tobacco Use  . Smoking status: Former Smoker    Packs/day: 0.50    Years: 15.00    Pack years: 7.50    Types: Cigarettes  . Smokeless tobacco: Never Used  . Tobacco comment: quit in May 2019  Vaping Use  . Vaping Use: Every day  . Substances: Nicotine, Flavoring  Substance Use Topics  . Alcohol use: Yes    Comment: rarely  . Drug use: No    Types: MDMA (Ecstacy)    Home Medications Prior to Admission medications   Medication Sig Start Date End Date Taking? Authorizing Provider  acetaminophen (TYLENOL) 500 MG tablet Take 500 mg by mouth every 6 (six) hours as needed for mild pain or moderate pain.   Yes [provider]  ALPRAZolam Prudy Feeler) 0.5 MG tablet Take 1 tablet (0.5 mg total) by mouth daily as needed for anxiety. Patient taking differently: Take 0.5 mg  by mouth See admin instructions. 0.5mg  every night And 0.5mg  daily as needed for panic 10/03/16  Yes English, Stephanie D, PA  Ascorbic Acid (VITAMIN C PO) Take 1 tablet by mouth daily.   Yes [provider]  Cholecalciferol (D3 PO) Take 1 capsule by mouth daily.   Yes [provider]  DULoxetine (CYMBALTA) 60 MG capsule Take 1 capsule (60 mg total) by mouth daily. 10/03/16  Yes English, Judeth Cornfield D, PA  ibuprofen (ADVIL) 200 MG tablet Take 600 mg by mouth every 6 (six) hours as needed for fever, headache or mild pain.   Yes [provider]  Probiotic Product (PROBIOTIC PO) Take 1 capsule by mouth daily.   Yes [provider]    Allergies    Contrast media [iodinated diagnostic agents], Stadol [butorphanol], and Other  Review of Systems   Review of Systems  Constitutional: Positive for activity change.  Eyes: Negative for visual disturbance.   Respiratory: Negative for shortness of breath.   Cardiovascular: Negative for chest pain.  Gastrointestinal: Negative for nausea and vomiting.  Allergic/Immunologic: Negative for immunocompromised state.  Neurological: Negative for headaches.  Psychiatric/Behavioral: Negative for behavioral problems.  All other systems reviewed and are negative.   Physical Exam Updated Vital Signs BP 134/83 (BP Location: Right Arm)   Pulse 68   Temp 98.6 F (37 C) (Oral)   Resp 18   Ht 5\' 3"  (1.6 m)   Wt 104.3 kg   LMP 11/08/2020   SpO2 97%   BMI 40.74 kg/m   Physical Exam Vitals and nursing note reviewed.  Constitutional:      Appearance: She is well-developed and well-nourished.  HENT:     Head: Normocephalic and atraumatic.  Eyes:     Extraocular Movements: Extraocular movements intact and EOM normal.     Pupils: Pupils are equal, round, and reactive to light.     Comments: No nystagmus  Neck:     Comments: No midline c-spine tenderness Cardiovascular:     Rate and Rhythm: Normal rate and regular rhythm.  Pulmonary:     Effort: Pulmonary effort is normal. No respiratory distress.     Breath sounds: Normal breath sounds.  Chest:     Chest wall: No tenderness.  Abdominal:     General: Bowel sounds are normal. There is no distension.     Palpations: Abdomen is soft.     Tenderness: There is no abdominal tenderness.  Musculoskeletal:     Cervical back: Neck supple.     Comments: No long bone tenderness - upper and lower extrmeities and no pelvic pain, instability.  Skin:    General: Skin is warm and dry.     Findings: No rash.  Neurological:     Mental Status: She is alert and oriented to person, place, and time.     Cranial Nerves: No cranial nerve deficit.     Sensory: No sensory deficit.     Motor: No weakness.     Coordination: Coordination normal.     Comments: Patient is noted to have a tremor over the left upper extremity.  Tremor is most obvious at rest.     ED  Results / Procedures / Treatments   Labs (all labs ordered are listed, but only abnormal results are displayed) Labs Reviewed  BASIC METABOLIC PANEL - Abnormal; Notable for the following components:      Result Value   Glucose, Bld 103 (*)    Calcium 8.8 (*)    All  other components within normal limits  RESP PANEL BY RT-PCR (FLU A&B, COVID) ARPGX2  CBC WITH DIFFERENTIAL/PLATELET  MAGNESIUM    EKG None  Radiology No results found.  Procedures Procedures (including critical care time)  Medications Ordered in ED Medications - No data to display  ED Course  I have reviewed the triage vital signs and the nursing notes.  Pertinent labs & imaging results that were available during my care of the patient were reviewed by me and considered in my medical decision making (see chart for details).    MDM Rules/Calculators/A&P                         49 year old female comes in a chief complaint of abnormal neurologic activity.  She has history of COVID-19 that was diagnosed 14 days ago.  This visit is not related to COVID-19.  It appears that she is having episodes of blank stare and rigidity for the last year.  The symptoms have become more pronounced and more frequent.  She was seeing a neurologist in [place], allegedly getting worked up for MS but has been lost to follow-up.  Differential diagnosis includes absence seizure. Concerns for acute pathology is low given that the symptoms have been present now for a year. CT had ordered and is negative for any bleeding, large tumor. Clinically does not appear that she is having anxiety/panic attack.  Thoughts are that she needs a seizure rule out in the ER and that work-up for movement disorder thereafter.  I spoke with teleneurologist, they recommended against.  If negative patient is suitable for outpatient follow-up.  Patient is in agreement with the plan.  Final Clinical Impression(s) / ED Diagnoses Final diagnoses:  Seizure-like  activity (HCC)    Rx / DC Orders ED Discharge Orders         Ordered    Ambulatory referral to Neurology       Comments: An appointment is requested in approximately: 1 week   11/22/20 1716           Derwood Kaplan, MD 12/22/20 1642

## 2021-01-31 ENCOUNTER — Encounter: Payer: Self-pay | Admitting: Neurology

## 2021-01-31 ENCOUNTER — Ambulatory Visit: Payer: Self-pay | Admitting: Neurology

## 2021-09-28 ENCOUNTER — Other Ambulatory Visit: Payer: Self-pay

## 2021-09-28 ENCOUNTER — Emergency Department (HOSPITAL_COMMUNITY)
Admission: EM | Admit: 2021-09-28 | Discharge: 2021-09-28 | Disposition: A | Discharge status: Home / Self Care | Payer: Self-pay | Attending: Emergency Medicine | Admitting: Emergency Medicine

## 2021-09-28 ENCOUNTER — Encounter (HOSPITAL_COMMUNITY): Payer: Self-pay | Admitting: Emergency Medicine

## 2021-09-28 DIAGNOSIS — Z87891 Personal history of nicotine dependence: Secondary | ICD-10-CM | POA: Insufficient documentation

## 2021-09-28 DIAGNOSIS — Z23 Encounter for immunization: Secondary | ICD-10-CM | POA: Insufficient documentation

## 2021-09-28 DIAGNOSIS — T148XXA Other injury of unspecified body region, initial encounter: Secondary | ICD-10-CM

## 2021-09-28 DIAGNOSIS — S61301A Unspecified open wound of left index finger with damage to nail, initial encounter: Secondary | ICD-10-CM | POA: Insufficient documentation

## 2021-09-28 DIAGNOSIS — W3189XA Contact with other specified machinery, initial encounter: Secondary | ICD-10-CM | POA: Insufficient documentation

## 2021-09-28 MED ORDER — LIDOCAINE HCL (PF) 1 % IJ SOLN
5.0000 mL | Freq: Once | INTRAMUSCULAR | Status: AC
  Administered 2021-09-28: 5 mL
  Filled 2021-09-28: qty 30

## 2021-09-28 MED ORDER — TETANUS-DIPHTH-ACELL PERTUSSIS 5-2.5-18.5 LF-MCG/0.5 IM SUSY
0.5000 mL | PREFILLED_SYRINGE | Freq: Once | INTRAMUSCULAR | Status: AC
  Administered 2021-09-28: 0.5 mL via INTRAMUSCULAR
  Filled 2021-09-28: qty 0.5

## 2021-09-28 NOTE — ED Provider Notes (Signed)
[facility]-EMERGENCY DEPT Provider Note   CSN: [id number] Arrival date & time: 09/28/21  1725     History Chief Complaint  Patient presents with   Laceration    Tiffany [name] is a 50 y.o. female.  Patient presents the emergency department today for evaluation of a left index finger skin avulsion.  Patient was using a piece of printing machinery today and was cut with a cutter.  No blood thinners but wound continued to bleed, prompting ED visit.  Unknown last tetanus.  No treatments prior to arrival.      Past Medical History:  Diagnosis Date   Allergy    Anxiety    Arthritis    CRPS (complex regional pain syndrome type I)    Depression    DES exposure in utero    SVT (supraventricular tachycardia) [facility])     Patient Active Problem List   Diagnosis Date Noted   SVT (supraventricular tachycardia) (HCC) 11/10/2013   Generalized anxiety disorder 11/10/2013    Past Surgical History:  Procedure Laterality Date   CESAREAN SECTION     x2   CHOLECYSTECTOMY     SALPINGOSTOMY     due to ectopic pregnancy     OB History   No obstetric history on file.     Family History  Problem Relation Age of Onset   Heart disease Mother    Hyperlipidemia Mother    Hypertension Mother    Mental illness Mother    COPD Mother    Mental illness Father        bipolar   Hyperlipidemia Sister    Hypertension Sister    Supraventricular tachycardia Sister    Hypertension Brother    Cancer Maternal Grandmother    Heart disease Maternal Grandmother    Hyperlipidemia Maternal Grandmother    Hypertension Maternal Grandmother    Stroke Maternal Grandmother    Supraventricular tachycardia Sister    Arthritis Daughter        JRA   Arthritis Daughter        JRA    Social History   Tobacco Use   Smoking status: Former    Packs/day: 0.50    Years: 15.00    Pack years: 7.50    Types: Cigarettes   Smokeless tobacco: Never   Tobacco comments:    quit in May  2019  Vaping Use   Vaping Use: Every day   Substances: Nicotine, Flavoring  Substance Use Topics   Alcohol use: Yes    Comment: rarely   Drug use: No    Types: MDMA (Ecstacy)    Home Medications Prior to Admission medications   Medication Sig Start Date End Date Taking? Authorizing Provider  acetaminophen (TYLENOL) 500 MG tablet Take 500 mg by mouth every 6 (six) hours as needed for mild pain or moderate pain.    [provider]  ALPRAZolam Prudy Feeler) 0.5 MG tablet Take 1 tablet (0.5 mg total) by mouth daily as needed for anxiety. Patient taking differently: Take 0.5 mg by mouth See admin instructions. 0.5mg  every night And 0.5mg  daily as needed for panic 10/03/16   English, Stephanie D, [place]  Ascorbic Acid (VITAMIN C PO) Take 1 tablet by mouth daily.    [provider]  Cholecalciferol (D3 PO) Take 1 capsule by mouth daily.    [provider]  DULoxetine (CYMBALTA) 60 MG capsule Take 1 capsule (60 mg total) by mouth daily. 10/03/16   Trena Platt D, PA  ibuprofen (  ADVIL) 200 MG tablet Take 600 mg by mouth every 6 (six) hours as needed for fever, headache or mild pain.    [provider]  Probiotic Product (PROBIOTIC PO) Take 1 capsule by mouth daily.    [provider]    Allergies    Contrast media [iodinated diagnostic agents], Stadol [butorphanol], and Other  Review of Systems   Review of Systems  Skin:  Positive for wound.  Neurological:  Negative for weakness and numbness.   Physical Exam Updated Vital Signs BP (!) 156/105   Pulse 89   Temp 98.9 F (37.2 C) (Oral)   Resp 18   SpO2 99%   Physical Exam Vitals and nursing note reviewed.  Constitutional:      Appearance: She is well-developed.  HENT:     Head: Normocephalic and atraumatic.  Eyes:     Conjunctiva/sclera: Conjunctivae normal.  Pulmonary:     Effort: No respiratory distress.  Musculoskeletal:     Cervical back: Normal range of motion and neck supple.   Skin:    General: Skin is warm and dry.     Comments: 1 cm sized skin avulsion to the pad of the left index finger, mild active venous oozing from the wound.  Nail is intact.  Normal range of motion.  Neurological:     Mental Status: She is alert.    ED Results / Procedures / Treatments   Labs (all labs ordered are listed, but only abnormal results are displayed) Labs Reviewed - No data to display  EKG None  Radiology No results found.  Procedures Procedures   Medications Ordered in ED Medications  Tdap (BOOSTRIX) injection 0.5 mL (has no administration in time range)  lidocaine (PF) (XYLOCAINE) 1 % injection 5 mL (5 mLs Infiltration Given 09/28/21 1830)    ED Course  I have reviewed the triage vital signs and the nursing notes.  Pertinent labs & imaging results that were available during my care of the patient were reviewed by me and considered in my medical decision making (see chart for details).  Patient seen and examined.   Vital signs reviewed and are as follows: BP (!) 156/105   Pulse 89   Temp 98.9 F (37.2 C) (Oral)   Resp 18   SpO2 99%   Approximately 1 cc of 1% lidocaine without epinephrine injected into the wound for anesthesia purposes.  Wound was then cleaned with wound cleaner.  Wound seal powder was applied followed by a nonstick dressing and 2 x 2's.  This was then secured with Coban.  Tip of the finger remains well perfused after the bandage and no obvious bleedthrough prior to discharge.  Pt urged to return with worsening pain, worsening swelling, expanding area of redness or streaking up extremity, fever, or any other concerns. Pt verbalizes understanding and agrees with plan.  Tetanus will be updated prior to discharge.    MDM Rules/Calculators/A&P                           Patient with skin avulsion/wound from a cutting machine.  Wound cleaned and bandaged as above due to ongoing minor bleeding.  Tetanus updated.    Final Clinical  Impression(s) / ED Diagnoses Final diagnoses:  Skin avulsion    Rx / DC Orders ED Discharge Orders     None        Renne Crigler, PA-C 09/28/21 1912    Gloris Manchester, MD 09/30/21 1154

## 2021-09-28 NOTE — ED Triage Notes (Signed)
While printing today, the patient cut a portion of the skin of her left index finger off.

## 2021-09-28 NOTE — Discharge Instructions (Signed)
Please read and follow all provided instructions.  Your diagnoses today include:  1. Skin avulsion     Tests performed today include: Vital signs. See below for your results today.   Medications prescribed:  None  Take any prescribed medications only as directed.   Home care instructions:  Follow any educational materials and wound care instructions contained in this packet.   Keep affected area above the level of your heart when possible to minimize swelling. Wash area gently twice a day with warm soapy water. Do not apply alcohol or hydrogen peroxide. Cover the area if it draining or weeping.   Remove the bandage gently during the afternoon tomorrow.  Gently clean the area.  Redress and continue good wound care at home for the next several days.  The scab from the wound powder will eventually fall off.  Return instructions:  Return to the Emergency Department if you have: Fever Worsening pain Worsening swelling of the wound Pus draining from the wound Redness of the skin that moves away from the wound, especially if it streaks away from the affected area  Any other emergent concerns  Your vital signs today were: BP (!) 156/105   Pulse 89   Temp 98.9 F (37.2 C) (Oral)   Resp 18   SpO2 99%  If your blood pressure (BP) was elevated above 135/85 this visit, please have this repeated by your doctor within one month. --------------

## 2021-12-08 ENCOUNTER — Encounter (HOSPITAL_COMMUNITY): Payer: Self-pay

## 2021-12-08 ENCOUNTER — Emergency Department (HOSPITAL_COMMUNITY): Payer: Self-pay | Admitting: Anesthesiology

## 2021-12-08 ENCOUNTER — Encounter (HOSPITAL_COMMUNITY)
Admission: EM | Disposition: A | Discharge status: Home / Self Care | Payer: Self-pay | Source: Home / Self Care | Attending: Emergency Medicine

## 2021-12-08 ENCOUNTER — Emergency Department (HOSPITAL_COMMUNITY): Payer: Self-pay

## 2021-12-08 ENCOUNTER — Ambulatory Visit (HOSPITAL_COMMUNITY)
Admission: EM | Admit: 2021-12-08 | Discharge: 2021-12-08 | Disposition: A | Discharge status: Home / Self Care | Payer: Self-pay | Attending: Emergency Medicine | Admitting: Emergency Medicine

## 2021-12-08 DIAGNOSIS — K5730 Diverticulosis of large intestine without perforation or abscess without bleeding: Secondary | ICD-10-CM | POA: Insufficient documentation

## 2021-12-08 DIAGNOSIS — F419 Anxiety disorder, unspecified: Secondary | ICD-10-CM | POA: Insufficient documentation

## 2021-12-08 DIAGNOSIS — K429 Umbilical hernia without obstruction or gangrene: Secondary | ICD-10-CM | POA: Insufficient documentation

## 2021-12-08 DIAGNOSIS — Z20822 Contact with and (suspected) exposure to covid-19: Secondary | ICD-10-CM | POA: Insufficient documentation

## 2021-12-08 DIAGNOSIS — K3580 Unspecified acute appendicitis: Secondary | ICD-10-CM | POA: Insufficient documentation

## 2021-12-08 DIAGNOSIS — Z6841 Body Mass Index (BMI) 40.0 and over, adult: Secondary | ICD-10-CM | POA: Insufficient documentation

## 2021-12-08 DIAGNOSIS — M1990 Unspecified osteoarthritis, unspecified site: Secondary | ICD-10-CM | POA: Insufficient documentation

## 2021-12-08 DIAGNOSIS — F32A Depression, unspecified: Secondary | ICD-10-CM | POA: Insufficient documentation

## 2021-12-08 DIAGNOSIS — G709 Myoneural disorder, unspecified: Secondary | ICD-10-CM | POA: Insufficient documentation

## 2021-12-08 HISTORY — PX: LAPAROSCOPIC APPENDECTOMY: SHX408

## 2021-12-08 LAB — RESP PANEL BY RT-PCR (FLU A&B, COVID) ARPGX2
Influenza A by PCR: NEGATIVE
Influenza B by PCR: NEGATIVE
SARS Coronavirus 2 by RT PCR: NEGATIVE

## 2021-12-08 LAB — CBC
HCT: 43.6 % (ref 36.0–46.0)
Hemoglobin: 15 g/dL (ref 12.0–15.0)
MCH: 31.1 pg (ref 26.0–34.0)
MCHC: 34.4 g/dL (ref 30.0–36.0)
MCV: 90.5 fL (ref 80.0–100.0)
Platelets: 245 10*3/uL (ref 150–400)
RBC: 4.82 MIL/uL (ref 3.87–5.11)
RDW: 12.8 % (ref 11.5–15.5)
WBC: 18.4 10*3/uL — ABNORMAL HIGH (ref 4.0–10.5)
nRBC: 0 % (ref 0.0–0.2)

## 2021-12-08 LAB — COMPREHENSIVE METABOLIC PANEL
ALT: 23 U/L (ref 0–44)
AST: 24 U/L (ref 15–41)
Albumin: 4 g/dL (ref 3.5–5.0)
Alkaline Phosphatase: 148 U/L — ABNORMAL HIGH (ref 38–126)
Anion gap: 7 (ref 5–15)
BUN: 8 mg/dL (ref 6–20)
CO2: 24 mmol/L (ref 22–32)
Calcium: 8.6 mg/dL — ABNORMAL LOW (ref 8.9–10.3)
Chloride: 104 mmol/L (ref 98–111)
Creatinine, Ser: 0.78 mg/dL (ref 0.44–1.00)
GFR, Estimated: >60 mL/min (ref >60)
Glucose, Bld: 146 mg/dL — ABNORMAL HIGH (ref 70–99)
Potassium: 3.6 mmol/L (ref 3.5–5.1)
Sodium: 135 mmol/L (ref 135–145)
Total Bilirubin: 0.9 mg/dL (ref 0.3–1.2)
Total Protein: 7.7 g/dL (ref 6.5–8.1)

## 2021-12-08 LAB — URINALYSIS, ROUTINE W REFLEX MICROSCOPIC
Bilirubin Urine: NEGATIVE
Glucose, UA: NEGATIVE mg/dL
Ketones, ur: 15 mg/dL — AB
Leukocytes,Ua: NEGATIVE
Nitrite: NEGATIVE
Protein, ur: NEGATIVE mg/dL
Specific Gravity, Urine: 1.02 (ref 1.005–1.030)
pH: 6.5 (ref 5.0–8.0)

## 2021-12-08 LAB — LIPASE, BLOOD: Lipase: 24 U/L (ref 11–51)

## 2021-12-08 LAB — I-STAT BETA HCG BLOOD, ED (MC, WL, AP ONLY): I-stat hCG, quantitative: <5 m[IU]/mL (ref <5)

## 2021-12-08 LAB — URINALYSIS, MICROSCOPIC (REFLEX): Bacteria, UA: NONE SEEN

## 2021-12-08 SURGERY — APPENDECTOMY, LAPAROSCOPIC
Anesthesia: General | Site: Abdomen

## 2021-12-08 MED ORDER — ONDANSETRON HCL 4 MG/2ML IJ SOLN
4.0000 mg | Freq: Once | INTRAMUSCULAR | Status: AC
  Administered 2021-12-08: 4 mg via INTRAVENOUS
  Filled 2021-12-08: qty 2

## 2021-12-08 MED ORDER — PROPOFOL 1000 MG/100ML IV EMUL
INTRAVENOUS | Status: AC
  Filled 2021-12-08: qty 100

## 2021-12-08 MED ORDER — AMISULPRIDE (ANTIEMETIC) 5 MG/2ML IV SOLN: 10.0000 mg | Freq: Once | INTRAVENOUS | Status: DC | PRN

## 2021-12-08 MED ORDER — LIDOCAINE 2% (20 MG/ML) 5 ML SYRINGE
INTRAMUSCULAR | Status: DC | PRN
  Administered 2021-12-08: 60 mg via INTRAVENOUS

## 2021-12-08 MED ORDER — 0.9 % SODIUM CHLORIDE (POUR BTL) OPTIME
TOPICAL | Status: DC | PRN
  Administered 2021-12-08: 500 mL

## 2021-12-08 MED ORDER — MIDAZOLAM HCL 2 MG/2ML IJ SOLN
INTRAMUSCULAR | Status: DC | PRN
  Administered 2021-12-08: 2 mg via INTRAVENOUS

## 2021-12-08 MED ORDER — PROMETHAZINE HCL 25 MG/ML IJ SOLN: 6.2500 mg | INTRAMUSCULAR | Status: DC | PRN

## 2021-12-08 MED ORDER — OXYCODONE HCL 5 MG PO TABS
5.0000 mg | ORAL_TABLET | ORAL | Status: AC
  Administered 2021-12-08: 5 mg via ORAL
  Filled 2021-12-08: qty 1

## 2021-12-08 MED ORDER — PHENYLEPHRINE 40 MCG/ML (10ML) SYRINGE FOR IV PUSH (FOR BLOOD PRESSURE SUPPORT)
PREFILLED_SYRINGE | INTRAVENOUS | Status: DC | PRN
  Administered 2021-12-08 (×2): 120 ug via INTRAVENOUS
  Administered 2021-12-08: 80 ug via INTRAVENOUS

## 2021-12-08 MED ORDER — PHENYLEPHRINE HCL-NACL 20-0.9 MG/250ML-% IV SOLN
INTRAVENOUS | Status: DC | PRN
  Administered 2021-12-08: 20 ug/min via INTRAVENOUS

## 2021-12-08 MED ORDER — ONDANSETRON HCL 4 MG/2ML IJ SOLN: 4.0000 mg | Freq: Four times a day (QID) | INTRAMUSCULAR | Status: DC | PRN

## 2021-12-08 MED ORDER — SODIUM CHLORIDE 0.9 % IV BOLUS
1000.0000 mL | Freq: Once | INTRAVENOUS | Status: AC
  Administered 2021-12-08: 1000 mL via INTRAVENOUS

## 2021-12-08 MED ORDER — PROPOFOL 10 MG/ML IV BOLUS
INTRAVENOUS | Status: DC | PRN
  Administered 2021-12-08: 200 mg via INTRAVENOUS

## 2021-12-08 MED ORDER — ONDANSETRON 4 MG PO TBDP
4.0000 mg | ORAL_TABLET | Freq: Once | ORAL | Status: AC | PRN
  Administered 2021-12-08: 4 mg via ORAL
  Filled 2021-12-08: qty 1

## 2021-12-08 MED ORDER — CHLORHEXIDINE GLUCONATE 0.12 % MT SOLN: 15.0000 mL | Freq: Once | OROMUCOSAL | Status: AC

## 2021-12-08 MED ORDER — SODIUM CHLORIDE 0.9 % IV SOLN: 2.0000 g | INTRAVENOUS | Status: DC

## 2021-12-08 MED ORDER — SUCCINYLCHOLINE CHLORIDE 200 MG/10ML IV SOSY
PREFILLED_SYRINGE | INTRAVENOUS | Status: DC | PRN
  Administered 2021-12-08: 100 mg via INTRAVENOUS

## 2021-12-08 MED ORDER — SUGAMMADEX SODIUM 200 MG/2ML IV SOLN
INTRAVENOUS | Status: DC | PRN
  Administered 2021-12-08: 400 mg via INTRAVENOUS

## 2021-12-08 MED ORDER — ROCURONIUM BROMIDE 10 MG/ML (PF) SYRINGE
PREFILLED_SYRINGE | INTRAVENOUS | Status: AC
  Filled 2021-12-08: qty 10

## 2021-12-08 MED ORDER — CHLORHEXIDINE GLUCONATE 0.12 % MT SOLN
OROMUCOSAL | Status: AC
  Administered 2021-12-08: 15 mL via OROMUCOSAL
  Filled 2021-12-08: qty 15

## 2021-12-08 MED ORDER — ONDANSETRON HCL 4 MG/2ML IJ SOLN
INTRAMUSCULAR | Status: DC | PRN
  Administered 2021-12-08: 4 mg via INTRAVENOUS

## 2021-12-08 MED ORDER — ONDANSETRON HCL 4 MG/2ML IJ SOLN
INTRAMUSCULAR | Status: AC
  Filled 2021-12-08: qty 2

## 2021-12-08 MED ORDER — PHENYLEPHRINE 40 MCG/ML (10ML) SYRINGE FOR IV PUSH (FOR BLOOD PRESSURE SUPPORT)
PREFILLED_SYRINGE | INTRAVENOUS | Status: AC
  Filled 2021-12-08: qty 10

## 2021-12-08 MED ORDER — METHOCARBAMOL 750 MG PO TABS: 750.0000 mg | ORAL_TABLET | Freq: Four times a day (QID) | ORAL | 1 refills | Status: DC

## 2021-12-08 MED ORDER — MORPHINE SULFATE (PF) 4 MG/ML IV SOLN
4.0000 mg | Freq: Once | INTRAVENOUS | Status: AC
  Administered 2021-12-08: 4 mg via INTRAVENOUS
  Filled 2021-12-08: qty 1

## 2021-12-08 MED ORDER — DEXAMETHASONE SODIUM PHOSPHATE 10 MG/ML IJ SOLN
INTRAMUSCULAR | Status: DC | PRN
  Administered 2021-12-08: 5 mg via INTRAVENOUS

## 2021-12-08 MED ORDER — FENTANYL CITRATE (PF) 100 MCG/2ML IJ SOLN: 25.0000 ug | INTRAMUSCULAR | Status: DC | PRN

## 2021-12-08 MED ORDER — DEXAMETHASONE SODIUM PHOSPHATE 10 MG/ML IJ SOLN
INTRAMUSCULAR | Status: AC
  Filled 2021-12-08: qty 1

## 2021-12-08 MED ORDER — PROPOFOL 10 MG/ML IV BOLUS
INTRAVENOUS | Status: AC
  Filled 2021-12-08: qty 20

## 2021-12-08 MED ORDER — METRONIDAZOLE 500 MG/100ML IV SOLN: 500.0000 mg | INTRAVENOUS | Status: DC

## 2021-12-08 MED ORDER — MIDAZOLAM HCL 2 MG/2ML IJ SOLN
INTRAMUSCULAR | Status: AC
  Filled 2021-12-08: qty 2

## 2021-12-08 MED ORDER — ROCURONIUM BROMIDE 10 MG/ML (PF) SYRINGE
PREFILLED_SYRINGE | INTRAVENOUS | Status: DC | PRN
  Administered 2021-12-08: 30 mg via INTRAVENOUS
  Administered 2021-12-08: 50 mg via INTRAVENOUS

## 2021-12-08 MED ORDER — SCOPOLAMINE 1 MG/3DAYS TD PT72
MEDICATED_PATCH | TRANSDERMAL | Status: AC
  Filled 2021-12-08: qty 1

## 2021-12-08 MED ORDER — OXYCODONE HCL 5 MG PO TABS
5.0000 mg | ORAL_TABLET | ORAL | 0 refills | Status: DC | PRN
Start: 2021-12-08 — End: 2022-08-16

## 2021-12-08 MED ORDER — LIDOCAINE 2% (20 MG/ML) 5 ML SYRINGE
INTRAMUSCULAR | Status: AC
  Filled 2021-12-08: qty 5

## 2021-12-08 MED ORDER — SODIUM CHLORIDE 0.9 % IV SOLN
1.0000 g | Freq: Once | INTRAVENOUS | Status: AC
  Administered 2021-12-08: 1 g via INTRAVENOUS
  Filled 2021-12-08: qty 10

## 2021-12-08 MED ORDER — LACTATED RINGERS IV SOLN: INTRAVENOUS | Status: DC

## 2021-12-08 MED ORDER — OXYCODONE HCL 5 MG PO TABS
ORAL_TABLET | ORAL | Status: AC
  Filled 2021-12-08: qty 1

## 2021-12-08 MED ORDER — BUPIVACAINE-EPINEPHRINE 0.25% -1:200000 IJ SOLN
INTRAMUSCULAR | Status: DC | PRN
  Administered 2021-12-08: 30 mL

## 2021-12-08 MED ORDER — FENTANYL CITRATE (PF) 250 MCG/5ML IJ SOLN
INTRAMUSCULAR | Status: AC
  Filled 2021-12-08: qty 5

## 2021-12-08 MED ORDER — BUPIVACAINE-EPINEPHRINE (PF) 0.25% -1:200000 IJ SOLN
INTRAMUSCULAR | Status: AC
  Filled 2021-12-08: qty 30

## 2021-12-08 MED ORDER — KETOROLAC TROMETHAMINE 30 MG/ML IJ SOLN: 30.0000 mg | Freq: Once | INTRAMUSCULAR | Status: DC

## 2021-12-08 MED ORDER — METRONIDAZOLE 500 MG/100ML IV SOLN
500.0000 mg | Freq: Once | INTRAVENOUS | Status: AC
  Administered 2021-12-08: 500 mg via INTRAVENOUS
  Filled 2021-12-08: qty 100

## 2021-12-08 MED ORDER — ACETAMINOPHEN 10 MG/ML IV SOLN: 1000.0000 mg | Freq: Once | INTRAVENOUS | Status: DC | PRN

## 2021-12-08 MED ORDER — SODIUM CHLORIDE 0.9 % IR SOLN
Status: DC | PRN
  Administered 2021-12-08: 1000 mL

## 2021-12-08 MED ORDER — DOCUSATE SODIUM 100 MG PO CAPS: 100.0000 mg | ORAL_CAPSULE | Freq: Two times a day (BID) | ORAL | 2 refills | Status: AC

## 2021-12-08 MED ORDER — FENTANYL CITRATE (PF) 250 MCG/5ML IJ SOLN
INTRAMUSCULAR | Status: DC | PRN
  Administered 2021-12-08 (×2): 50 ug via INTRAVENOUS
  Administered 2021-12-08: 100 ug via INTRAVENOUS

## 2021-12-08 MED ORDER — MORPHINE SULFATE (PF) 2 MG/ML IV SOLN: 2.0000 mg | INTRAVENOUS | Status: DC | PRN

## 2021-12-08 MED ORDER — SCOPOLAMINE 1 MG/3DAYS TD PT72
1.0000 | MEDICATED_PATCH | TRANSDERMAL | Status: DC
  Administered 2021-12-08: 1.5 mg via TRANSDERMAL

## 2021-12-08 MED ORDER — ORAL CARE MOUTH RINSE: 15.0000 mL | Freq: Once | OROMUCOSAL | Status: AC

## 2021-12-08 MED ORDER — FENTANYL CITRATE (PF) 100 MCG/2ML IJ SOLN
INTRAMUSCULAR | Status: AC
  Filled 2021-12-08: qty 2

## 2021-12-08 MED ORDER — SUCCINYLCHOLINE CHLORIDE 200 MG/10ML IV SOSY
PREFILLED_SYRINGE | INTRAVENOUS | Status: AC
  Filled 2021-12-08: qty 10

## 2021-12-08 MED ORDER — IBUPROFEN 200 MG PO TABS
600.0000 mg | ORAL_TABLET | Freq: Four times a day (QID) | ORAL | 1 refills | Status: DC
Start: 2021-12-08 — End: 2023-11-16

## 2021-12-08 SURGICAL SUPPLY — 57 items
ADH SKN CLS APL DERMABOND .7 (GAUZE/BANDAGES/DRESSINGS) ×1
ADH SKN CLS LQ APL DERMABOND (GAUZE/BANDAGES/DRESSINGS) ×1
APL PRP STRL LF DISP 70% ISPRP (MISCELLANEOUS) ×1
APPLIER CLIP ROT 10 11.4 M/L (STAPLE)
APR CLP MED LRG 11.4X10 (STAPLE)
BAG COUNTER SPONGE SURGICOUNT (BAG) ×2 IMPLANT
BAG SPEC RTRVL LRG 6X4 10 (ENDOMECHANICALS) ×1
BAG SPNG CNTER NS LX DISP (BAG) ×1
BLADE CLIPPER SURG (BLADE) IMPLANT
CANISTER SUCT 3000ML PPV (MISCELLANEOUS) IMPLANT
CHLORAPREP W/TINT 26 (MISCELLANEOUS) ×2 IMPLANT
CLIP APPLIE ROT 10 11.4 M/L (STAPLE) IMPLANT
CNTNR URN SCR LID CUP LEK RST (MISCELLANEOUS) IMPLANT
CONT SPEC 4OZ STRL OR WHT (MISCELLANEOUS) ×2
COVER SURGICAL LIGHT HANDLE (MISCELLANEOUS) ×2 IMPLANT
CUTTER FLEX LINEAR 45M (STAPLE) ×2 IMPLANT
DERMABOND ADHESIVE PROPEN (GAUZE/BANDAGES/DRESSINGS) ×1
DERMABOND ADVANCED (GAUZE/BANDAGES/DRESSINGS) ×1
DERMABOND ADVANCED .7 DNX12 (GAUZE/BANDAGES/DRESSINGS) IMPLANT
DERMABOND ADVANCED .7 DNX6 (GAUZE/BANDAGES/DRESSINGS) ×1 IMPLANT
ELECT CAUTERY BLADE 6.4 (BLADE) ×2 IMPLANT
ELECT REM PT RETURN 9FT ADLT (ELECTROSURGICAL) ×2
ELECTRODE REM PT RTRN 9FT ADLT (ELECTROSURGICAL) ×1 IMPLANT
ENDOLOOP SUT PDS II  0 18 (SUTURE)
ENDOLOOP SUT PDS II 0 18 (SUTURE) IMPLANT
GLOVE SURG ENC MOIS LTX SZ6.5 (GLOVE) ×2 IMPLANT
GLOVE SURG UNDER POLY LF SZ6 (GLOVE) ×2 IMPLANT
GOWN STRL REUS W/ TWL LRG LVL3 (GOWN DISPOSABLE) ×3 IMPLANT
GOWN STRL REUS W/TWL LRG LVL3 (GOWN DISPOSABLE) ×6
IRRIG SUCT STRYKERFLOW 2 WTIP (MISCELLANEOUS) ×2
IRRIGATION SUCT STRKRFLW 2 WTP (MISCELLANEOUS) IMPLANT
KIT BASIN OR (CUSTOM PROCEDURE TRAY) ×2 IMPLANT
KIT TURNOVER KIT B (KITS) ×2 IMPLANT
NDL INSUFFLATION 14GA 120MM (NEEDLE) IMPLANT
NEEDLE INSUFFLATION 14GA 120MM (NEEDLE) IMPLANT
NS IRRIG 1000ML POUR BTL (IV SOLUTION) ×2 IMPLANT
PAD ARMBOARD 7.5X6 YLW CONV (MISCELLANEOUS) ×4 IMPLANT
PENCIL BUTTON HOLSTER BLD 10FT (ELECTRODE) ×2 IMPLANT
POUCH SPECIMEN RETRIEVAL 10MM (ENDOMECHANICALS) ×2 IMPLANT
RELOAD 45 VASCULAR/THIN (ENDOMECHANICALS) ×4 IMPLANT
RELOAD STAPLE 45 2.5 WHT GRN (ENDOMECHANICALS) IMPLANT
RELOAD STAPLE 45 3.5 BLU ETS (ENDOMECHANICALS) IMPLANT
RELOAD STAPLE TA45 3.5 REG BLU (ENDOMECHANICALS) ×2 IMPLANT
SCISSORS LAP 5X35 DISP (ENDOMECHANICALS) IMPLANT
SET IRRIG TUBING LAPAROSCOPIC (IRRIGATION / IRRIGATOR) IMPLANT
SET TUBE SMOKE EVAC HIGH FLOW (TUBING) ×2 IMPLANT
SHEARS HARMONIC ACE PLUS 36CM (ENDOMECHANICALS) IMPLANT
SLEEVE ENDOPATH XCEL 5M (ENDOMECHANICALS) ×2 IMPLANT
SPECIMEN JAR SMALL (MISCELLANEOUS) ×2 IMPLANT
SUT MNCRL AB 4-0 PS2 18 (SUTURE) ×2 IMPLANT
SUT VICRYL 0 UR6 27IN ABS (SUTURE) IMPLANT
TOWEL GREEN STERILE (TOWEL DISPOSABLE) ×2 IMPLANT
TOWEL GREEN STERILE FF (TOWEL DISPOSABLE) ×2 IMPLANT
TRAY LAPAROSCOPIC MC (CUSTOM PROCEDURE TRAY) ×2 IMPLANT
TROCAR XCEL BLUNT TIP 100MML (ENDOMECHANICALS) IMPLANT
TROCAR XCEL NON-BLD 5MMX100MML (ENDOMECHANICALS) ×2 IMPLANT
WATER STERILE IRR 1000ML POUR (IV SOLUTION) ×2 IMPLANT

## 2021-12-08 NOTE — Anesthesia Postprocedure Evaluation (Signed)
Anesthesia Post Note  Patient: Tiffany [name]  Procedure(s) Performed: APPENDECTOMY LAPAROSCOPIC (Abdomen)     Patient location during evaluation: PACU Anesthesia Type: General Level of consciousness: awake Pain management: pain level controlled Vital Signs Assessment: post-procedure vital signs reviewed and stable Respiratory status: spontaneous breathing Cardiovascular status: stable Postop Assessment: no apparent nausea or vomiting Anesthetic complications: no   No notable events documented.  Last Vitals:  Vitals:   12/08/21 1240 12/08/21 1245  BP: 136/69   Pulse: 86 92  Resp: 19 20  Temp:  36.7 C  SpO2: 94% 95%    Last Pain:  Vitals:   12/08/21 1155  TempSrc:   PainSc: Asleep                 Jarrel Knoke

## 2021-12-08 NOTE — Transfer of Care (Signed)
Immediate Anesthesia Transfer of Care Note  Patient: Tiffany [name]  Procedure(s) Performed: APPENDECTOMY LAPAROSCOPIC (Abdomen)  Patient Location: PACU  Anesthesia Type:General  Level of Consciousness: awake and alert   Airway & Oxygen Therapy: Patient Spontanous Breathing and Patient connected to face mask oxygen  Post-op Assessment: Report given to RN and Post -op Vital signs reviewed and stable  Post vital signs: Reviewed and stable  Last Vitals:  Vitals Value Taken Time  BP 143/73 12/08/21 1155  Temp 37 C 12/08/21 1155  Pulse 90 12/08/21 1156  Resp 19 12/08/21 1156  SpO2 94 % 12/08/21 1156  Vitals shown include unvalidated device data.  Last Pain:  Vitals:   12/08/21 0952  TempSrc: Oral  PainSc:          Complications: No notable events documented.

## 2021-12-08 NOTE — Anesthesia Preprocedure Evaluation (Addendum)
Anesthesia Evaluation  Patient identified by MRN, date of birth, ID band Patient awake    Reviewed: Allergy & Precautions, NPO status , Patient's Chart, lab work & pertinent test results  Airway Mallampati: II  TM Distance: >3 FB Neck ROM: Full    Dental no notable dental hx.    Pulmonary former smoker,    Pulmonary exam normal breath sounds clear to auscultation       Cardiovascular negative cardio ROS Normal cardiovascular exam Rhythm:Regular Rate:Normal     Neuro/Psych PSYCHIATRIC DISORDERS Anxiety Depression  Neuromuscular disease    GI/Hepatic negative GI ROS, Neg liver ROS,   Endo/Other  Morbid obesity  Renal/GU negative Renal ROS     Musculoskeletal  (+) Arthritis ,   Abdominal   Peds  Hematology negative hematology ROS (+)   Anesthesia Other Findings Appendicitis  Reproductive/Obstetrics                            Anesthesia Physical Anesthesia Plan  ASA: 3  Anesthesia Plan: General   Post-op Pain Management:    Induction: Intravenous and Rapid sequence  PONV Risk Score and Plan: 4 or greater and Ondansetron, Dexamethasone, Midazolam, Scopolamine patch - Pre-op and Treatment may vary due to age or medical condition  Airway Management Planned: Oral ETT  Additional Equipment:   Intra-op Plan:   Post-operative Plan: Extubation in OR  Informed Consent: I have reviewed the patients History and Physical, chart, labs and discussed the procedure including the risks, benefits and alternatives for the proposed anesthesia with the patient or authorized representative who has indicated his/her understanding and acceptance.     Dental advisory given  Plan Discussed with: CRNA  Anesthesia Plan Comments:        Anesthesia Quick Evaluation

## 2021-12-08 NOTE — ED Provider Notes (Signed)
[facility]-EMERGENCY DEPT Provider Note   CSN: [id number] Arrival date & time: 12/08/21  0034     History Chief Complaint  Patient presents with   Abdominal Pain    Tiffany [name] is a 50 y.o. female.  Patient is a 50 year old female with past medical history of prior cholecystectomy, SVT.  Patient presenting today for evaluation of abdominal pain.  This started at approximately 530 this evening after eating a piece of candy.  Tiffany [name] describes pain mainly in the epigastric region and right side of her abdomen.  Pain has been constant with no diarrhea, constipation, or urinary symptoms.  Tiffany [name] denies any fevers or chills.  The history is provided by the patient.  Abdominal Pain Pain location:  Epigastric Pain quality: stabbing   Pain radiates to:  Does not radiate Pain severity:  Moderate Onset quality:  Sudden Duration:  8 hours Timing:  Constant Progression:  Worsening Chronicity:  New     Past Medical History:  Diagnosis Date   Allergy    Anxiety    Arthritis    CRPS (complex regional pain syndrome type I)    Depression    DES exposure in utero    SVT (supraventricular tachycardia) [facility])     Patient Active Problem List   Diagnosis Date Noted   SVT (supraventricular tachycardia) (HCC) 11/10/2013   Generalized anxiety disorder 11/10/2013    Past Surgical History:  Procedure Laterality Date   CESAREAN SECTION     x2   CHOLECYSTECTOMY     SALPINGOSTOMY     due to ectopic pregnancy     OB History   No obstetric history on file.     Family History  Problem Relation Age of Onset   Heart disease Mother    Hyperlipidemia Mother    Hypertension Mother    Mental illness Mother    COPD Mother    Mental illness Father        bipolar   Hyperlipidemia Sister    Hypertension Sister    Supraventricular tachycardia Sister    Hypertension Brother    Cancer Maternal Grandmother    Heart disease Maternal Grandmother    Hyperlipidemia Maternal  Grandmother    Hypertension Maternal Grandmother    Stroke Maternal Grandmother    Supraventricular tachycardia Sister    Arthritis Daughter        JRA   Arthritis Daughter        JRA    Social History   Tobacco Use   Smoking status: Former    Packs/day: 0.50    Years: 15.00    Pack years: 7.50    Types: Cigarettes   Smokeless tobacco: Never   Tobacco comments:    quit in May 2019  Vaping Use   Vaping Use: Every day   Substances: Nicotine, Flavoring  Substance Use Topics   Alcohol use: Yes    Comment: rarely   Drug use: No    Types: MDMA (Ecstacy)    Home Medications Prior to Admission medications   Medication Sig Start Date End Date Taking? Authorizing Provider  acetaminophen (TYLENOL) 500 MG tablet Take 500 mg by mouth every 6 (six) hours as needed for mild pain or moderate pain.    [provider]  ALPRAZolam Prudy Feeler) 0.5 MG tablet Take 1 tablet (0.5 mg total) by mouth daily as needed for anxiety. Patient taking differently: Take 0.5 mg by mouth See admin instructions. 0.5mg  every night And 0.5mg  daily as needed for panic  10/03/16   Trena Platt D, PA  Ascorbic Acid (VITAMIN C PO) Take 1 tablet by mouth daily.    [provider]  Cholecalciferol (D3 PO) Take 1 capsule by mouth daily.    [provider]  DULoxetine (CYMBALTA) 60 MG capsule Take 1 capsule (60 mg total) by mouth daily. 10/03/16   Trena Platt D, PA  ibuprofen (ADVIL) 200 MG tablet Take 600 mg by mouth every 6 (six) hours as needed for fever, headache or mild pain.    [provider]  Probiotic Product (PROBIOTIC PO) Take 1 capsule by mouth daily.    [provider]    Allergies    Contrast media [iodinated diagnostic agents], Stadol [butorphanol], and Other  Review of Systems   Review of Systems  Gastrointestinal:  Positive for abdominal pain.  All other systems reviewed and are negative.  Physical Exam Updated Vital Signs BP (!) 179/88     Pulse 82    Temp 97.8 F (36.6 C) (Oral)    Resp 18    Ht 5\' 3"  (1.6 m)    Wt 110.2 kg    SpO2 96%    BMI 43.05 kg/m   Physical Exam Vitals and nursing note reviewed.  Constitutional:      General: Tiffany [name] is not in acute distress.    Appearance: Tiffany [name] is well-developed. Tiffany [name] is not diaphoretic.  HENT:     Head: Normocephalic and atraumatic.  Cardiovascular:     Rate and Rhythm: Normal rate and regular rhythm.     Heart sounds: No murmur heard.   No friction rub. No gallop.  Pulmonary:     Effort: Pulmonary effort is normal. No respiratory distress.     Breath sounds: Normal breath sounds. No wheezing.  Abdominal:     General: Bowel sounds are normal. There is no distension.     Palpations: Abdomen is soft.     Tenderness: There is abdominal tenderness in the right upper quadrant, right lower quadrant and epigastric area. There is no right CVA tenderness, left CVA tenderness, guarding or rebound.  Musculoskeletal:        General: Normal range of motion.     Cervical back: Normal range of motion and neck supple.  Skin:    General: Skin is warm and dry.  Neurological:     General: No focal deficit present.     Mental Status: Tiffany [name] is alert and oriented to person, place, and time.    ED Results / Procedures / Treatments   Labs (all labs ordered are listed, but only abnormal results are displayed) Labs Reviewed  COMPREHENSIVE METABOLIC PANEL - Abnormal; Notable for the following components:      Result Value   Glucose, Bld 146 (*)    Calcium 8.6 (*)    Alkaline Phosphatase 148 (*)    All other components within normal limits  CBC - Abnormal; Notable for the following components:   WBC 18.4 (*)    All other components within normal limits  URINALYSIS, ROUTINE W REFLEX MICROSCOPIC - Abnormal; Notable for the following components:   Hgb urine dipstick MODERATE (*)    Ketones, ur 15 (*)    All other components within normal limits  LIPASE, BLOOD  URINALYSIS, MICROSCOPIC (REFLEX)   I-STAT BETA HCG BLOOD, ED ([facility], [facility], [facility] ONLY)    EKG None  Radiology No results found.  Procedures Procedures   Medications Ordered in ED Medications  sodium chloride 0.9 % bolus 1,000 mL (has no  administration in time range)  ondansetron (ZOFRAN) injection 4 mg (has no administration in time range)  morphine 4 MG/ML injection 4 mg (has no administration in time range)  ondansetron (ZOFRAN-ODT) disintegrating tablet 4 mg (4 mg Oral Given 12/08/21 0488)    ED Course  I have reviewed the triage vital signs and the nursing notes.  Pertinent labs & imaging results that were available during my care of the patient were reviewed by me and considered in my medical decision making (see chart for details).    MDM Rules/Calculators/A&P  Patient presenting with complaints of epigastric pain.  This started approximately 6 PM after eating a piece of candy.  Tiffany [name] is tender in the epigastrium and to a lesser extent, the right upper quadrant.  White count is 18.4 and CT scan shows acute appendicitis.  Care discussed with Dr. Dwain Sarna from general surgery.  Patient will receive Rocephin and Flagyl and be evaluated by the general surgery team first thing in the morning.  Patient made NPO.  Final Clinical Impression(s) / ED Diagnoses Final diagnoses:  None    Rx / DC Orders ED Discharge Orders     None        Geoffery Lyons, MD 12/08/21 [phone]

## 2021-12-08 NOTE — Anesthesia Procedure Notes (Signed)
Procedure Name: Intubation Date/Time: 12/08/2021 10:39 AM Performed by: Lorie Phenix, CRNA Pre-anesthesia Checklist: Patient identified, Emergency Drugs available, Suction available and Patient being monitored Patient Re-evaluated:Patient Re-evaluated prior to induction Oxygen Delivery Method: Circle system utilized Preoxygenation: Pre-oxygenation with 100% oxygen Induction Type: Rapid sequence and Cricoid Pressure applied Laryngoscope Size: Mac and 3 Grade View: Grade I Tube type: Oral Tube size: 7.5 mm Number of attempts: 1 Airway Equipment and Method: Stylet Placement Confirmation: ETT inserted through vocal cords under direct vision, positive ETCO2 and breath sounds checked- equal and bilateral Secured at: 22 cm Tube secured with: Tape Dental Injury: Teeth and Oropharynx as per pre-operative assessment

## 2021-12-08 NOTE — ED Triage Notes (Signed)
Patient arrives from home with report of epigastric pain that radiates down through her ribs beginning around 5 pm. Pt endorses N/V, denies diarrhea, fevers, or urinary symptoms.

## 2021-12-08 NOTE — Discharge Instructions (Addendum)
[facility] LAPAROSCOPIC SURGERY: POST OP INSTRUCTIONS Always review your discharge instruction sheet given to you by the facility where your surgery was performed. IF YOU HAVE DISABILITY OR FAMILY LEAVE FORMS, YOU MUST BRING THEM TO THE OFFICE FOR PROCESSING.   DO NOT GIVE THEM TO YOUR DOCTOR.  PAIN CONTROL  First take acetaminophen (Tylenol) AND/or ibuprofen (Advil) to control your pain after surgery.  Follow directions on package.  Taking acetaminophen (Tylenol) and/or ibuprofen (Advil) regularly after surgery will help to control your pain and lower the amount of prescription pain medication you may need.  You should not take more than 3,000 mg (3 grams) of acetaminophen (Tylenol) in 24 hours.  You should not take ibuprofen (Advil), aleve, motrin, naprosyn or other NSAIDS if you have a history of stomach ulcers or chronic kidney disease.  A prescription for pain medication may be given to you upon discharge.  Take your pain medication as prescribed, if you still have uncontrolled pain after taking acetaminophen (Tylenol) or ibuprofen (Advil). Use ice packs to help control pain. If you need a refill on your pain medication, please contact your pharmacy.  They will contact our office to request authorization. Prescriptions will not be filled after 5pm or on week-ends.  HOME MEDICATIONS Take your usually prescribed medications unless otherwise directed.  DIET You should follow a light diet the first few days after arrival home.  Be sure to include lots of fluids daily. Avoid fatty, fried foods.   CONSTIPATION It is common to experience some constipation after surgery and if you are taking pain medication.  Increasing fluid intake and taking a stool softener (such as Colace) will usually help or prevent this problem from occurring.  A mild laxative (Milk of Magnesia or Miralax) should be taken according to package instructions if there are no bowel movements after 48  hours.  WOUND/INCISION CARE Most patients will experience some swelling and bruising in the area of the incisions.  Ice packs will help.  Swelling and bruising can take several days to resolve.  Unless discharge instructions indicate otherwise, follow guidelines below  STERI-STRIPS - you may remove your outer bandages 48 hours after surgery, and you may shower at that time.  You have steri-strips (small skin tapes) in place directly over the incision.  These strips should be left on the skin for 7-10 days.   DERMABOND/SKIN GLUE - you may shower in 24 hours.  The glue will flake off over the next 2-3 weeks. Any sutures or staples will be removed at the office during your follow-up visit.  ACTIVITIES You may resume regular (light) daily activities beginning the next day--such as daily self-care, walking, climbing stairs--gradually increasing activities as tolerated.  You may have sexual intercourse when it is comfortable.  Refrain from any heavy lifting or straining until approved by your doctor. You may drive when you are no longer taking prescription pain medication, you can comfortably wear a seatbelt, and you can safely maneuver your car and apply brakes.  FOLLOW-UP You should see your doctor in the office for a follow-up appointment approximately 2-3 weeks after your surgery.  You should have been given your post-op/follow-up appointment when your surgery was scheduled.  If you did not receive a post-op/follow-up appointment, make sure that you call for this appointment within a day or two after you arrive home to insure a convenient appointment time.   WHEN TO CALL YOUR DOCTOR: Fever over 101.0 Inability to urinate Continued bleeding from incision.   Increased pain, redness, or drainage from the incision. Increasing abdominal pain  The clinic staff is available to answer your questions during regular business hours.  Please don't hesitate to call and ask to speak to one of the nurses for  clinical concerns.  If you have a medical emergency, go to the nearest emergency room or call 911.  A surgeon from [facility] is always on call at the hospital. [address], [address], [place], [place]  [address] ? [address] [address], [place], [place]   [address] [phone] ? [phone] ? FAX [phone] Web site: [email or web address]      Managing Your Pain After Surgery Without Opioids    Thank you for participating in our program to help patients manage their pain after surgery without opioids. This is part of our effort to provide you with the best care possible, without exposing you or your family to the risk that opioids pose.  What pain can I expect after surgery? You can expect to have some pain after surgery. This is normal. The pain is typically worse the day after surgery, and quickly begins to get better. Many studies have found that many patients are able to manage their pain after surgery with Over-the-Counter (OTC) medications such as Tylenol and Motrin. If you have a condition that does not allow you to take Tylenol or Motrin, notify your surgical team.  How will I manage my pain? The best strategy for controlling your pain after surgery is around the clock pain control with Tylenol (acetaminophen) and Motrin (ibuprofen or Advil). Alternating these medications with each other allows you to maximize your pain control. In addition to Tylenol and Motrin, you can use heating pads or ice packs on your incisions to help reduce your pain.  How will I alternate your regular strength over-the-counter pain medication? You will take a dose of pain medication every three hours. Start by taking 650 mg of Tylenol (2 pills of 325 mg) 3 hours later take 600 mg of Motrin (3 pills of 200 mg) 3 hours after taking the Motrin take 650 mg of Tylenol 3 hours after that take 600 mg of Motrin.   - 1 -  See example - if your first dose of Tylenol is at 12:00  PM   12:00 PM Tylenol 650 mg (2 pills of 325 mg)  3:00 PM Motrin 600 mg (3 pills of 200 mg)  6:00 PM Tylenol 650 mg (2 pills of 325 mg)  9:00 PM Motrin 600 mg (3 pills of 200 mg)  Continue alternating every 3 hours   We recommend that you follow this schedule around-the-clock for at least 3 days after surgery, or until you feel that it is no longer needed. Use the table on the last page of this handout to keep track of the medications you are taking. Important: Do not take more than 3000mg of Tylenol or 3200mg of Motrin in a 24-hour period. Do not take ibuprofen/Motrin if you have a history of bleeding stomach ulcers, severe kidney disease, &/or actively taking a blood thinner  What if I still have pain? If you have pain that is not controlled with the over-the-counter pain medications (Tylenol and Motrin or Advil) you might have what we call "breakthrough" pain. You will receive a prescription for a small amount of an opioid pain medication such as Oxycodone, Tramadol, or Tylenol with Codeine. Use these opioid pills in the first 24 hours after surgery if you have breakthrough pain. Do   not take more than 1 pill every 4-6 hours.  If you still have uncontrolled pain after using all opioid pills, don't hesitate to call our staff using the number provided. We will help make sure you are managing your pain in the best way possible, and if necessary, we can provide a prescription for additional pain medication.   Day 1    Time  Name of Medication Number of pills taken  Amount of Acetaminophen  Pain Level   Comments  AM PM       AM PM       AM PM       AM PM       AM PM       AM PM       AM PM       AM PM       Total Daily amount of Acetaminophen Do not take more than  3,000 mg per day      Day 2    Time  Name of Medication Number of pills taken  Amount of Acetaminophen  Pain Level   Comments  AM PM       AM PM       AM PM       AM PM       AM PM       AM PM       AM  PM       AM PM       Total Daily amount of Acetaminophen Do not take more than  3,000 mg per day      Day 3    Time  Name of Medication Number of pills taken  Amount of Acetaminophen  Pain Level   Comments  AM PM       AM PM       AM PM       AM PM          AM PM       AM PM       AM PM       AM PM       Total Daily amount of Acetaminophen Do not take more than  3,000 mg per day      Day 4    Time  Name of Medication Number of pills taken  Amount of Acetaminophen  Pain Level   Comments  AM PM       AM PM       AM PM       AM PM       AM PM       AM PM       AM PM       AM PM       Total Daily amount of Acetaminophen Do not take more than  3,000 mg per day      Day 5    Time  Name of Medication Number of pills taken  Amount of Acetaminophen  Pain Level   Comments  AM PM       AM PM       AM PM       AM PM       AM PM       AM PM       AM PM       AM PM       Total Daily amount of Acetaminophen Do not take more than    3,000 mg per day       Day 6    Time  Name of Medication Number of pills taken  Amount of Acetaminophen  Pain Level  Comments  AM PM       AM PM       AM PM       AM PM       AM PM       AM PM       AM PM       AM PM       Total Daily amount of Acetaminophen Do not take more than  3,000 mg per day      Day 7    Time  Name of Medication Number of pills taken  Amount of Acetaminophen  Pain Level   Comments  AM PM       AM PM       AM PM       AM PM       AM PM       AM PM       AM PM       AM PM       Total Daily amount of Acetaminophen Do not take more than  3,000 mg per day        For additional information about how and where to safely dispose of unused opioid medications - https://www.[employer]powerful[employer].org  Disclaimer: This document contains information and/or instructional materials adapted from [facility] for the typical patient with your condition. It does not replace medical advice  from your health care provider because your experience may differ from that of the typical patient. Talk to your health care provider if you have any questions about this document, your condition or your treatment plan. Adapted from [facility]  

## 2021-12-08 NOTE — Op Note (Signed)
° °  Operative Note   Date: 12/08/2021  Procedure: laparoscopic appendectomy  Pre-op diagnosis: acute appendicitis Post-op diagnosis: Grade 1b appendicitis  Indication and clinical history: The patient is a 50 y.o. year old female with acute appendicitis     Surgeon: Diamantina Monks, MD  Anesthesiologist: Bradley Ferris, MD Anesthesia: General  Findings:  Specimen: appendix EBL: <5cc Drains/Implants: none  Disposition: PACU - hemodynamically stable.  Description of procedure: The patient was positioned supine on the operating room table. Time-out was performed verifying correct patient, procedure, signature of informed consent, and administration of pre-operative antibiotics. General anesthetic induction and intubation were uneventful. Foley catheter insertion was not performed as patient voided immediately prior to the procedure . The abdomen was prepped and draped in the usual sterile fashion. An infra-umbilical incision was made using an open technique using zero vicryl stay sutures on either side of the fascia and a 43mm Hassan port inserted. After establishing pneumoperitoneum, which the patient tolerated well, the abdominal cavity was inspected and no injury of any intra-abdominal structures was identified. Two additional five millimeter ports were placed under direct visualization and using local anesthetic in the suprapubic and left lower quadrant regions. The patient was repositioned to Trendelenburg with the left side down. Further inspection of the right lower quadrant revealed purulent fluid and grade 1b appendicitis. Dissection, mobilization, and identification of the appendix was performed. The appendix was dissected away from its mesoappendix and an endoscopic stapler used to divide the mesoappendix using a vascular load. A bowel load of the endoscopic stapler was used to staple across the appendix at its base. Both staple lines were inspected and found to be intact and without  bleeding. The appendix was placed in an endoscopic specimen retrieval bag, removed via the umbilical port site, and sent to pathology as a permanent specimen. The right lower quadrant was again inspected and hemostasis confirmed. Any remaining fluid identified was suctioned. The suprapubic and left lower quadrant ports were removed under direct visualization and hemostasis confirmed. The umbilical port was removed last after desufflating the abdomen and the fascia re-approximated using the stay sutures. Additional local anesthetic was administered at the umbilical incision site. The skin of all port sites was closed with 4-0 monocryl. Sterile dressings were applied. All sponge and instrument counts were correct at the conclusion of the procedure. The patient was awakened from anesthesia, extubated uneventfully, and transported to the PACU in good condition. There were no complications.   Upon entering the abdomen (organ space), I encountered infection of the appendix .  CASE DATA:  Type of patient?: DOW CASE (Surgical Hospitalist [facility] Inpatient)  Status of Case? URGENT Add On  Infection Present At Time Of Surgery (PATOS)?  INFECTION of the appendix   Diamantina Monks, MD General and Trauma Surgery [facility]

## 2021-12-08 NOTE — H&P (Signed)
Tiffany [name] [birth year]  [id number].    Requesting MD: Dr. Veryl Speak Chief Complaint/Reason for Consult: Acute Appendicitis  HPI: Tiffany [name] is a 50 y.o. female who presented to the ED on 12/15 for abdominal pain.  Patient reports that yesterday around 5:30 PM she began having epigastric abdominal pain.  She reports pain was initially mild but gradually increased to a moderate/severe pain that radiated to her right abdomen/flank with associated nausea and vomiting.  She also reports 2 episodes of loose stool.  No associated fever or chills.  She has never had pain like this before.  Pain and nausea improved after IV medications in the ED.  She underwent work-up that showed WBC 18.4, and CT consistent with acute appendicitis without evidence of rupture, free air or abscess.  Patient has history of prior C-section and cholecystectomy.  She is Blood thinners.  Reports she vapes.  No alcohol or drug use.  Self-employed for a [employer].  Married.  ROS: Review of Systems  Constitutional:  Negative for chills and fever.  HENT:  Positive for congestion.   Respiratory:  Positive for cough. Negative for shortness of breath.   Cardiovascular:  Negative for chest pain.  Gastrointestinal:  Positive for abdominal pain, diarrhea, nausea and vomiting. Negative for constipation.  Genitourinary:  Negative for dysuria, hematuria and urgency.  Psychiatric/Behavioral:  Negative for substance abuse.   All other systems reviewed and are negative.  Family History  Problem Relation Age of Onset   Heart disease Mother    Hyperlipidemia Mother    Hypertension Mother    Mental illness Mother    COPD Mother    Mental illness Father        bipolar   Hyperlipidemia Sister    Hypertension Sister    Supraventricular tachycardia Sister    Hypertension Brother    Cancer Maternal Grandmother    Heart disease Maternal Grandmother    Hyperlipidemia Maternal Grandmother    Hypertension Maternal  Grandmother    Stroke Maternal Grandmother    Supraventricular tachycardia Sister    Arthritis Daughter        JRA   Arthritis Daughter        JRA    Past Medical History:  Diagnosis Date   Allergy    Anxiety    Arthritis    CRPS (complex regional pain syndrome type I)    Depression    DES exposure in utero    SVT (supraventricular tachycardia) (HCC)     Past Surgical History:  Procedure Laterality Date   CESAREAN SECTION     x2   CHOLECYSTECTOMY     SALPINGOSTOMY     due to ectopic pregnancy    Social History:  reports that she has quit smoking. Her smoking use included cigarettes. She has a 7.50 pack-year smoking history. She has never used smokeless tobacco. She reports current alcohol use. She reports that she does not use drugs.  Allergies:  Allergies  Allergen Reactions   Contrast Media [Iodinated Diagnostic Agents] Shortness Of Breath   Stadol [Butorphanol] Shortness Of Breath   Other     Beta blockers exacerbate asthma     (Not in a hospital admission)    Physical Exam: Blood pressure (!) 146/85, pulse 97, temperature 97.8 F (36.6 C), temperature source Oral, resp. rate 18, height 5\' 3"  (1.6 m), weight 110.2 kg, SpO2 95 %. General: pleasant, WD/WN white female who is laying in bed in NAD HEENT: head  is normocephalic, atraumatic.  Sclera are noninjected.  PERRL.  Ears and nose without any masses or lesions.  Mouth is pink and moist. Dentition fair Heart: regular, rate, and rhythm.  Normal s1,s2. No obvious murmurs, gallops, or rubs noted.  Palpable pedal pulses bilaterally  Lungs: CTAB, no wheezes, rhonchi, or rales noted.  Respiratory effort nonlabored Abd: Soft, ND, diffuse right sided abdominal tenderness without peritonitis, +BS, no masses or organomegaly. Umbilical hernia noted on CT is not palpated/appreciated MS: no BUE/BLE edema, calves soft and nontender Skin: warm and dry with no masses, lesions, or rashes Psych: A&Ox4 with an appropriate  affect Neuro: cranial nerves grossly intact, equal strength in BUE/BLE bilaterally, normal speech, thought process intact, moves all extremities, gait not assessed   Results for orders placed or performed during the hospital encounter of 12/08/21 (from the past 48 hour(s))  Lipase, blood     Status: None   Collection Time: 12/08/21 12:56 AM  Result Value Ref Range   Lipase 24 11 - 51 U/L    Comment: Performed at [facility], [place] [address] [place], [place] [address]  Comprehensive metabolic panel     Status: Abnormal   Collection Time: 12/08/21 12:56 AM  Result Value Ref Range   Sodium 135 135 - 145 mmol/L   Potassium 3.6 3.5 - 5.1 mmol/L   Chloride 104 98 - 111 mmol/L   CO2 24 22 - 32 mmol/L   Glucose, Bld 146 (H) 70 - 99 mg/dL    Comment: Glucose reference range applies only to samples taken after fasting for at least 8 hours.   BUN 8 6 - 20 mg/dL   Creatinine, Ser 0.78 0.44 - 1.00 mg/dL   Calcium 8.6 (L) 8.9 - 10.3 mg/dL   Total Protein 7.7 6.5 - 8.1 g/dL   Albumin 4.0 3.5 - 5.0 g/dL   AST 24 15 - 41 U/L   ALT 23 0 - 44 U/L   Alkaline Phosphatase 148 (H) 38 - 126 U/L   Total Bilirubin 0.9 0.3 - 1.2 mg/dL   GFR, Estimated >60 >60 mL/min    Comment: (NOTE) Calculated using the CKD-EPI Creatinine Equation (2021)    Anion gap 7 5 - 15    Comment: Performed at [facility], [place] [address], [place], [place] [address]  CBC     Status: Abnormal   Collection Time: 12/08/21 12:56 AM  Result Value Ref Range   WBC 18.4 (H) 4.0 - 10.5 K/uL   RBC 4.82 3.87 - 5.11 MIL/uL   Hemoglobin 15.0 12.0 - 15.0 g/dL   HCT 43.6 36.0 - 46.0 %   MCV 90.5 80.0 - 100.0 fL   MCH 31.1 26.0 - 34.0 pg   MCHC 34.4 30.0 - 36.0 g/dL   RDW 12.8 11.5 - 15.5 %   Platelets 245 150 - 400 K/uL   nRBC 0.0 0.0 - 0.2 %    Comment: Performed at [facility], [place] [address] [place], [place] [address]  Urinalysis, Routine w reflex microscopic Urine, Clean  Catch     Status: Abnormal   Collection Time: 12/08/21 12:56 AM  Result Value Ref Range   Color, Urine YELLOW YELLOW   APPearance CLEAR CLEAR   Specific Gravity, Urine 1.020 1.005 - 1.030   pH 6.5 5.0 - 8.0   Glucose, UA NEGATIVE NEGATIVE mg/dL   Hgb urine dipstick MODERATE (A) NEGATIVE   Bilirubin Urine NEGATIVE NEGATIVE   Ketones, ur 15 (A) NEGATIVE  mg/dL   Protein, ur NEGATIVE NEGATIVE mg/dL   Nitrite NEGATIVE NEGATIVE   Leukocytes,Ua NEGATIVE NEGATIVE    Comment: Performed at [place] [address], [place], [place] [address]  Urinalysis, Microscopic (reflex)     Status: None   Collection Time: 12/08/21 12:56 AM  Result Value Ref Range   RBC / HPF 6-10 0 - 5 RBC/hpf   WBC, UA 0-5 0 - 5 WBC/hpf   Bacteria, UA NONE SEEN NONE SEEN   Squamous Epithelial / LPF 0-5 0 - 5    Comment: Performed at [facility], [place] [address] [place], [place] [address]  I-Stat beta hCG blood, ED     Status: None   Collection Time: 12/08/21  1:13 AM  Result Value Ref Range   I-stat hCG, quantitative <5.0 <5 mIU/mL   Comment 3            Comment:   GEST. AGE      CONC.  (mIU/mL)   <=1 WEEK        5 - 50     2 WEEKS       50 - 500     3 WEEKS       100 - 10,000     4 WEEKS     1,000 - 30,000        FEMALE AND NON-PREGNANT FEMALE:     LESS THAN 5 mIU/mL   Resp Panel by RT-PCR (Flu A&B, Covid) Nasopharyngeal Swab     Status: None   Collection Time: 12/08/21  5:45 AM   Specimen: Nasopharyngeal Swab; Nasopharyngeal(NP) swabs in vial transport medium  Result Value Ref Range   SARS Coronavirus 2 by RT PCR NEGATIVE NEGATIVE    Comment: (NOTE) SARS-CoV-2 target nucleic acids are NOT DETECTED.  The SARS-CoV-2 RNA is generally detectable in upper respiratory specimens during the acute phase of infection. The lowest concentration of SARS-CoV-2 viral copies this assay can detect is 138 copies/mL. A negative result does not preclude SARS-Cov-2 infection and  should not be used as the sole basis for treatment or other patient management decisions. A negative result may occur with  improper specimen collection/handling, submission of specimen other than nasopharyngeal swab, presence of viral mutation(s) within the areas targeted by this assay, and inadequate number of viral copies(<138 copies/mL). A negative result must be combined with clinical observations, patient history, and epidemiological information. The expected result is Negative.  Fact Sheet for Patients:  EntrepreneurPulse.com.au  Fact Sheet for Healthcare Providers:  IncredibleEmployment.be  This test is no t yet approved or cleared by the [place] FDA and  has been authorized for detection and/or diagnosis of SARS-CoV-2 by FDA under an Emergency Use Authorization (EUA). This EUA will remain  in effect (meaning this test can be used) for the duration of the COVID-19 declaration under Section 564(b)(1) of the Act, 21 U.S.C.section 360bbb-3(b)(1), unless the authorization is terminated  or revoked sooner.       Influenza A by PCR NEGATIVE NEGATIVE   Influenza B by PCR NEGATIVE NEGATIVE    Comment: (NOTE) The Xpert Xpress SARS-CoV-2/FLU/RSV plus assay is intended as an aid in the diagnosis of influenza from Nasopharyngeal swab specimens and should not be used as a sole basis for treatment. Nasal washings and aspirates are unacceptable for Xpert Xpress SARS-CoV-2/FLU/RSV testing.  Fact Sheet for Patients: EntrepreneurPulse.com.au  Fact Sheet for Healthcare Providers: IncredibleEmployment.be  This test is not yet approved or cleared by the [place]  States FDA and has been authorized for detection and/or diagnosis of SARS-CoV-2 by FDA under an Emergency Use Authorization (EUA). This EUA will remain in effect (meaning this test can be used) for the duration of the COVID-19 declaration under Section  564(b)(1) of the Act, 21 U.S.C. section 360bbb-3(b)(1), unless the authorization is terminated or revoked.  Performed at [facility], [place] [address], [place], [place] [address]    CT ABDOMEN PELVIS WO CONTRAST  Result Date: 12/08/2021 CLINICAL DATA:  Epigastric pain. EXAM: CT ABDOMEN AND PELVIS WITHOUT CONTRAST TECHNIQUE: Multidetector CT imaging of the abdomen and pelvis was performed following the standard protocol without IV contrast. COMPARISON:  None. FINDINGS: Lower chest: No acute abnormality. Hepatobiliary: No focal liver abnormality is seen. Status post cholecystectomy. No biliary dilatation. Pancreas: Unremarkable without contrast.  No adjacent edema. Spleen: Normal in size and unremarkable without contrast. Adrenals/Urinary Tract: There is no adrenal mass, no intrarenal stone is seen. There is no ureteral stone or hydroureteronephrosis. There is no bladder thickening. Stomach/Bowel: The stomach and unopacified small bowel are unremarkable. The appendix is thick-walled, distended up to 13.1 mm, and inflamed consistent with acute appendicitis. No appendiceal stone is seen. There is trace nonlocalizing reactive periappendiceal fluid but no abscess or free air. The large intestine wall unremarkable aside from scattered diverticulosis. Vascular/Lymphatic: No significant vascular findings are present. No enlarged abdominal or pelvic lymph nodes. Reproductive: The uterus is intact, tilted to right. Right ovary is unremarkable. Left ovary demonstrates an 8.8 x 7.8 by 8.2 cm thin walled cystic lesion of 15 Hounsfield units. No mural nodularity is seen and no septations. Other: Small umbilical fat hernia. There is no free air, hemorrhage or pelvic ascites. The only free fluid is the minimal periappendiceal fluid. Musculoskeletal: There are degenerative changes of the thoracic and lumbar spine, spurring and sclerosis of the pubic symphysis, mild sclerosis at the SI joints and bone  islands in the left femoral head. IMPRESSION: 1. Acute appendicitis without evidence of rupture. 2. Minimal nonlocalizing periappendiceal fluid. No free air or abscess. 3. Nearly 9 cm thin walled left ovarian cystic lesion. Follow-up ultrasound recommended for further characterization. Benign and malignant etiologies are both possible for this lesion. 4. Diverticulosis without evidence of diverticulitis. 5. Umbilical fat hernia. 6. Mild symmetric features of sacroiliitis.  Osteitis pubis. 7. Discussed over the phone with Dr. Veryl Speak at 4:46 a.m., 12/08/2021 Electronically Signed   By: Telford Nab M.D.   On: 12/08/2021 04:50    Anti-infectives (From admission, onward)    Start     Dose/Rate Route Frequency Ordered Stop   12/08/21 0500  cefTRIAXone (ROCEPHIN) 1 g in sodium chloride 0.9 % 100 mL IVPB        1 g 200 mL/hr over 30 Minutes Intravenous  Once 12/08/21 0445 12/08/21 0532   12/08/21 0500  metroNIDAZOLE (FLAGYL) IVPB 500 mg        500 mg 100 mL/hr over 60 Minutes Intravenous  Once 12/08/21 0445 12/08/21 [id number]       Assessment/Plan Acute Appendicitis  Patient presented with <24 hours of right-sided abdominal pain with associated nausea and vomiting.  CT scan with evidence of acute appendicitis without perforation or abscess.  We discussed operative versus nonoperative management. I have discussed the procedure and risks of appendectomy. The risks include but are not limited to anesthesia (MI, CVA, aspiration, death), bleeding, infection, scarring, pain, wound problems, injury to intra-abdominal organs/structures, stump appendicitis and hernia. She seems to understand and would like to  proceed with planned procedure.  Due to OR availability we will plan to transfer to [facility] to have this done.  I spoke to [facility] who is already aware of the patient and states she will be on the next track to preop at [facility].  Continue IV antibiotics.  Admit to outpatient observation.  Possible discharge from  PACU.  FEN - NPO VTE - SCDs ID - Rocephin/Flagyl  L Ovarian Cyst - will need outpatient follow up. Discussed with patient  Jacinto Halim, [facility] Surgery 12/08/2021, 8:11 AM Please see Amion for pager number during day hours 7:00am-4:30pm

## 2021-12-09 ENCOUNTER — Encounter (HOSPITAL_COMMUNITY): Payer: Self-pay | Admitting: Surgery

## 2021-12-09 LAB — SURGICAL PATHOLOGY

## 2021-12-10 ENCOUNTER — Encounter (HOSPITAL_COMMUNITY): Payer: Self-pay

## 2021-12-10 ENCOUNTER — Other Ambulatory Visit: Payer: Self-pay

## 2021-12-10 ENCOUNTER — Emergency Department (HOSPITAL_COMMUNITY)
Admission: EM | Admit: 2021-12-10 | Discharge: 2021-12-10 | Disposition: A | Discharge status: Home / Self Care | Payer: Self-pay | Attending: Emergency Medicine | Admitting: Emergency Medicine

## 2021-12-10 ENCOUNTER — Emergency Department (HOSPITAL_COMMUNITY): Payer: Self-pay

## 2021-12-10 DIAGNOSIS — Z87891 Personal history of nicotine dependence: Secondary | ICD-10-CM | POA: Insufficient documentation

## 2021-12-10 DIAGNOSIS — R109 Unspecified abdominal pain: Secondary | ICD-10-CM | POA: Insufficient documentation

## 2021-12-10 DIAGNOSIS — Z79899 Other long term (current) drug therapy: Secondary | ICD-10-CM | POA: Insufficient documentation

## 2021-12-10 DIAGNOSIS — R110 Nausea: Secondary | ICD-10-CM | POA: Insufficient documentation

## 2021-12-10 LAB — CBC
HCT: 39.3 % (ref 36.0–46.0)
Hemoglobin: 13.2 g/dL (ref 12.0–15.0)
MCH: 31.8 pg (ref 26.0–34.0)
MCHC: 33.6 g/dL (ref 30.0–36.0)
MCV: 94.7 fL (ref 80.0–100.0)
Platelets: 203 10*3/uL (ref 150–400)
RBC: 4.15 MIL/uL (ref 3.87–5.11)
RDW: 13.3 % (ref 11.5–15.5)
WBC: 8.7 10*3/uL (ref 4.0–10.5)
nRBC: 0 % (ref 0.0–0.2)

## 2021-12-10 LAB — COMPREHENSIVE METABOLIC PANEL
ALT: 23 U/L (ref 0–44)
AST: 18 U/L (ref 15–41)
Albumin: 3.3 g/dL — ABNORMAL LOW (ref 3.5–5.0)
Alkaline Phosphatase: 104 U/L (ref 38–126)
Anion gap: 8 (ref 5–15)
BUN: 9 mg/dL (ref 6–20)
CO2: 23 mmol/L (ref 22–32)
Calcium: 8.2 mg/dL — ABNORMAL LOW (ref 8.9–10.3)
Chloride: 106 mmol/L (ref 98–111)
Creatinine, Ser: 0.66 mg/dL (ref 0.44–1.00)
GFR, Estimated: >60 mL/min (ref >60)
Glucose, Bld: 101 mg/dL — ABNORMAL HIGH (ref 70–99)
Potassium: 3.3 mmol/L — ABNORMAL LOW (ref 3.5–5.1)
Sodium: 137 mmol/L (ref 135–145)
Total Bilirubin: 0.5 mg/dL (ref 0.3–1.2)
Total Protein: 6.5 g/dL (ref 6.5–8.1)

## 2021-12-10 LAB — LIPASE, BLOOD: Lipase: 26 U/L (ref 11–51)

## 2021-12-10 LAB — TROPONIN I (HIGH SENSITIVITY): Troponin I (High Sensitivity): 7 ng/L (ref <18)

## 2021-12-10 MED ORDER — ONDANSETRON HCL 4 MG PO TABS: 4.0000 mg | ORAL_TABLET | Freq: Four times a day (QID) | ORAL | 0 refills | Status: DC

## 2021-12-10 NOTE — ED Triage Notes (Signed)
Pt states she had an emergent appendectomy on the 15th. Pt reports this morning she began experiencing swelling to all extremities, swelling to her neck that makes her feel like she is going to pass out,chest tightness, nausea, and vomiting.

## 2021-12-10 NOTE — ED Provider Notes (Signed)
[facility]-EMERGENCY DEPT Provider Note   CSN: [id number] Arrival date & time: 12/10/21  3267     History Chief Complaint  Patient presents with   Post-op Problem    Tiffany [name] is a 50 y.o. female.  50 year old female with prior medical history as detailed below presents for evaluation.  Patient reports that she is status post appendectomy 2 days prior.  This morning she woke up feeling like her hands, feet, and neck were swollen.  She felt like she might pass out.  She felt " like she was out of her body."  She denies nausea, vomiting, abdominal pain, fever.  She is taking good p.o.  She denies significant problems with her recent bowel movement since the surgery.  The history is provided by the patient.  Illness Location:  Feels swollen, feels "out of it" since appendectomy Severity:  Mild Onset quality:  Gradual Duration:  1 day Timing:  Rare Progression:  Waxing and waning Chronicity:  New     Past Medical History:  Diagnosis Date   Allergy    Anxiety    Arthritis    CRPS (complex regional pain syndrome type I)    Depression    DES exposure in utero    SVT (supraventricular tachycardia) (HCC)     Patient Active Problem List   Diagnosis Date Noted   SVT (supraventricular tachycardia) (HCC) 11/10/2013   Generalized anxiety disorder 11/10/2013    Past Surgical History:  Procedure Laterality Date   CESAREAN SECTION     x2   CHOLECYSTECTOMY     LAPAROSCOPIC APPENDECTOMY N/A 12/08/2021   Procedure: APPENDECTOMY LAPAROSCOPIC;  Surgeon: Diamantina Monks, MD;  Location: [facility] OR;  Service: General;  Laterality: N/A;   SALPINGOSTOMY     due to ectopic pregnancy     OB History   No obstetric history on file.     Family History  Problem Relation Age of Onset   Heart disease Mother    Hyperlipidemia Mother    Hypertension Mother    Mental illness Mother    COPD Mother    Mental illness Father        bipolar   Hyperlipidemia  Sister    Hypertension Sister    Supraventricular tachycardia Sister    Hypertension Brother    Cancer Maternal Grandmother    Heart disease Maternal Grandmother    Hyperlipidemia Maternal Grandmother    Hypertension Maternal Grandmother    Stroke Maternal Grandmother    Supraventricular tachycardia Sister    Arthritis Daughter        JRA   Arthritis Daughter        JRA    Social History   Tobacco Use   Smoking status: Former    Packs/day: 0.50    Years: 15.00    Pack years: 7.50    Types: Cigarettes   Smokeless tobacco: Never   Tobacco comments:    quit in May 2019  Vaping Use   Vaping Use: Every day   Substances: Nicotine, Flavoring  Substance Use Topics   Alcohol use: Yes    Comment: rarely   Drug use: No    Types: MDMA (Ecstacy)    Home Medications Prior to Admission medications   Medication Sig Start Date End Date Taking? Authorizing Provider  acetaminophen (TYLENOL) 500 MG tablet Take 500 mg by mouth every 6 (six) hours as needed for mild pain or moderate pain.    [provider]  ALPRAZolam Prudy Feeler) 0.5  MG tablet Take 1 tablet (0.5 mg total) by mouth daily as needed for anxiety. Patient taking differently: Take 0.5 mg by mouth See admin instructions. 0.5mg  every night And 0.5mg  daily as needed for panic 10/03/16   English, Stephanie D, [place]  Ascorbic Acid (VITAMIN C PO) Take 1 tablet by mouth daily.    [provider]  Cholecalciferol (D3 PO) Take 1 capsule by mouth daily.    [provider]  docusate sodium (COLACE) 100 MG capsule Take 1 capsule (100 mg total) by mouth 2 (two) times daily. 12/08/21 03/08/22  Diamantina Monks, MD  DULoxetine (CYMBALTA) 60 MG capsule Take 1 capsule (60 mg total) by mouth daily. 10/03/16   Trena Platt D, PA  ibuprofen (ADVIL) 200 MG tablet Take 3 tablets (600 mg total) by mouth 4 (four) times daily. 12/08/21   Diamantina Monks, MD  methocarbamol (ROBAXIN-750) 750 MG tablet Take 1 tablet (750 mg  total) by mouth 4 (four) times daily. 12/08/21   Diamantina Monks, MD  oxyCODONE (ROXICODONE) 5 MG immediate release tablet Take 1 tablet (5 mg total) by mouth every 4 (four) hours as needed for severe pain. 12/08/21 12/08/22  Diamantina Monks, MD  Probiotic Product (PROBIOTIC PO) Take 1 capsule by mouth daily.    [provider]    Allergies    Contrast media [iodinated diagnostic agents], Stadol [butorphanol], and Other  Review of Systems   Review of Systems  All other systems reviewed and are negative.  Physical Exam Updated Vital Signs BP 121/73    Pulse (!) 58    Temp 98.7 F (37.1 C)    Resp 18    Ht 5\' 3"  (1.6 m)    Wt 110.2 kg    SpO2 100%    BMI 43.05 kg/m   Physical Exam Vitals and nursing note reviewed.  Constitutional:      General: She is not in acute distress.    Appearance: Normal appearance. She is well-developed.  HENT:     Head: Normocephalic and atraumatic.  Eyes:     Conjunctiva/sclera: Conjunctivae normal.     Pupils: Pupils are equal, round, and reactive to light.  Cardiovascular:     Rate and Rhythm: Normal rate and regular rhythm.     Heart sounds: Normal heart sounds.  Pulmonary:     Effort: Pulmonary effort is normal. No respiratory distress.     Breath sounds: Normal breath sounds.  Abdominal:     General: There is no distension.     Palpations: Abdomen is soft.     Tenderness: There is no abdominal tenderness.     Comments: Incision sites on abdomen status post recent appendectomy are clean, dry, and intact.  There is no surrounding erythema.  Patient without appreciable tenderness of the abdomen.  Musculoskeletal:        General: No deformity. Normal range of motion.     Cervical back: Normal range of motion and neck supple.  Skin:    General: Skin is warm and dry.  Neurological:     General: No focal deficit present.     Mental Status: She is alert and oriented to person, place, and time.    ED Results / Procedures / Treatments    Labs (all labs ordered are listed, but only abnormal results are displayed) Labs Reviewed  COMPREHENSIVE METABOLIC PANEL - Abnormal; Notable for the following components:      Result Value   Potassium 3.3 (*)  Glucose, Bld 101 (*)    Calcium 8.2 (*)    Albumin 3.3 (*)    All other components within normal limits  CBC  LIPASE, BLOOD  TROPONIN I (HIGH SENSITIVITY)    EKG EKG Interpretation  Date/Time:  Saturday December 10 2021 09:30:29 EST Ventricular Rate:  68 PR Interval:  156 QRS Duration: 85 QT Interval:  418 QTC Calculation: 445 R Axis:   34 Text Interpretation: Sinus rhythm Confirmed by Kristine Royal [id number]) on 12/10/2021 11:56:43 AM  Radiology DG Chest 2 View  Result Date: 12/10/2021 CLINICAL DATA:  Chest tightness. Emergency appendectomy performed on 12/08/2021. EXAM: CHEST - 2 VIEW COMPARISON:  04/25/2018 and older studies. FINDINGS: Cardiac silhouette is normal in size and configuration. Normal mediastinal and hilar contours. Minor linear/reticular opacity at the left lateral lung base consistent with scarring or atelectasis. Lungs otherwise clear. No pleural effusion or pneumothorax. Skeletal structures are intact. IMPRESSION: No active cardiopulmonary disease. Electronically Signed   By: Amie Portland M.D.   On: 12/10/2021 09:35    Procedures Procedures   Medications Ordered in ED Medications - No data to display  ED Course  I have reviewed the triage vital signs and the nursing notes.  Pertinent labs & imaging results that were available during my care of the patient were reviewed by me and considered in my medical decision making (see chart for details).    MDM Rules/Calculators/A&P                         MDM  MSE complete  Tiffany [name] was evaluated in Emergency Department on 12/10/2021 for the symptoms described in the history of present illness. She was evaluated in the context of the global COVID-19 pandemic, which necessitated consideration  that the patient might be at risk for infection with the SARS-CoV-2 virus that causes COVID-19. Institutional protocols and algorithms that pertain to the evaluation of patients at risk for COVID-19 are in a state of rapid change based on information released by regulatory bodies including the CDC and federal and state organizations. These policies and algorithms were followed during the patient's care in the ED.  Patient is presenting with complaint of feeling swollen after recent appendectomy performed 48 hours prior.  Patient is visibly anxious during exam.  Patient's described symptoms are without strong suggestion of significant pathology.  Screening labs obtained are without significant abnormality.  Patient requested CT abdomen pelvis imaging.  She is very concerned that there is a possible "problem with her surgery."  CT imaging performed.  Patient with normal post appendectomy exam on CT.  Patient is reassured by her normal work-up.  She does understand need for close follow-up.  Strict return precautions given and understood.      Final Clinical Impression(s) / ED Diagnoses Final diagnoses:  Nausea    Rx / DC Orders ED Discharge Orders     None        Wynetta Fines, MD 12/10/21 1348

## 2021-12-10 NOTE — ED Provider Notes (Signed)
Emergency Medicine Provider Triage Evaluation Note  Tiffany [name] , a 50 y.o. female  was evaluated in triage.  Pt complains of swelling. S/p appendectomy 2 days ago. Feels swelling to hands, feet and BL neck. Feels like there is pressure and as though she is going to pass out. Feels like she is " gliding outside of her body." She has had a xanax this morning. + N/v, + post op pain yesterday now resolved " like it's numb."   Review of Systems  Positive: swelling Negative: cp  Physical Exam  BP (!) 144/70 (BP Location: Left Arm)    Pulse 84    Temp 98.7 F (37.1 C)    Resp 16    SpO2 95%  Gen:   Awake, no distress   Resp:  Normal effort  MSK:   Moves extremities without difficulty  Other:  Well healing surgical scars no sub Q crepitus  Medical Decision Making  Medically screening exam initiated at 9:28 AM.  Appropriate orders placed.  Tiffany [name] was informed that the remainder of the evaluation will be completed by another provider, this initial triage assessment does not replace that evaluation, and the importance of remaining in the ED until their evaluation is complete.   No free air on CXR. Work up pending.   Arthor Captain, PA-C 12/10/21 0935    Wynetta Fines, MD 12/10/21 [phone]

## 2021-12-10 NOTE — Discharge Instructions (Addendum)
Return for any problem.  ?

## 2022-01-05 ENCOUNTER — Encounter (HOSPITAL_COMMUNITY): Payer: Self-pay | Admitting: Radiology

## 2022-04-10 ENCOUNTER — Emergency Department (HOSPITAL_COMMUNITY): Payer: Self-pay

## 2022-04-10 ENCOUNTER — Emergency Department (HOSPITAL_COMMUNITY)
Admission: EM | Admit: 2022-04-10 | Discharge: 2022-04-11 | Disposition: A | Discharge status: Home / Self Care | Payer: Self-pay | Attending: Emergency Medicine | Admitting: Emergency Medicine

## 2022-04-10 ENCOUNTER — Encounter (HOSPITAL_COMMUNITY): Payer: Self-pay

## 2022-04-10 DIAGNOSIS — Z20822 Contact with and (suspected) exposure to covid-19: Secondary | ICD-10-CM | POA: Insufficient documentation

## 2022-04-10 DIAGNOSIS — J40 Bronchitis, not specified as acute or chronic: Secondary | ICD-10-CM | POA: Insufficient documentation

## 2022-04-10 DIAGNOSIS — R739 Hyperglycemia, unspecified: Secondary | ICD-10-CM | POA: Insufficient documentation

## 2022-04-10 DIAGNOSIS — F17200 Nicotine dependence, unspecified, uncomplicated: Secondary | ICD-10-CM | POA: Insufficient documentation

## 2022-04-10 LAB — CBC WITH DIFFERENTIAL/PLATELET
Abs Immature Granulocytes: 0.05 10*3/uL (ref 0.00–0.07)
Basophils Absolute: 0 10*3/uL (ref 0.0–0.1)
Basophils Relative: 0 %
Eosinophils Absolute: 0.3 10*3/uL (ref 0.0–0.5)
Eosinophils Relative: 3 %
HCT: 43.8 % (ref 36.0–46.0)
Hemoglobin: 14.8 g/dL (ref 12.0–15.0)
Immature Granulocytes: 0 %
Lymphocytes Relative: 16 %
Lymphs Abs: 2 10*3/uL (ref 0.7–4.0)
MCH: 31.3 pg (ref 26.0–34.0)
MCHC: 33.8 g/dL (ref 30.0–36.0)
MCV: 92.6 fL (ref 80.0–100.0)
Monocytes Absolute: 0.6 10*3/uL (ref 0.1–1.0)
Monocytes Relative: 5 %
Neutro Abs: 9.6 10*3/uL — ABNORMAL HIGH (ref 1.7–7.7)
Neutrophils Relative %: 76 %
Platelets: 232 10*3/uL (ref 150–400)
RBC: 4.73 MIL/uL (ref 3.87–5.11)
RDW: 12.5 % (ref 11.5–15.5)
WBC: 12.6 10*3/uL — ABNORMAL HIGH (ref 4.0–10.5)
nRBC: 0 % (ref 0.0–0.2)

## 2022-04-10 NOTE — ED Provider Triage Note (Signed)
Emergency Medicine Provider Triage Evaluation Note ? ?Tiffany [name] , a 51 y.o. female  was evaluated in triage.  Pt complains of chest pain & back pain, constant, associated w/ dyspnea. Has had a few days of productive cough and subjective fevers.  ? ?Review of Systems  ?Positive: Cough, dyspnea, chest pain, back pain, fever ?Negative: Hemoptysis, leg pain/swelling ? ?Physical Exam  ?BP (!) 165/103 (BP Location: Left Arm)   Pulse 97   Temp 98.1 ?F (36.7 ?C) (Oral)   Resp 14   SpO2 97%  ?Gen:   Awake, no distress   ?Resp:  Normal effort  ?MSK:   Moves extremities without difficulty  ?Other:  Anterior & posterior chest wall TTP.  ? ?Medical Decision Making  ?Medically screening exam initiated at 11:24 PM.  Appropriate orders placed.  Tiffany [name] was informed that the remainder of the evaluation will be completed by another provider, this initial triage assessment does not replace that evaluation, and the importance of remaining in the ED until their evaluation is complete. ? ?Chest pain ?  ?Cherly Anderson, PA-C ?04/11/22 3244 ? ?

## 2022-04-10 NOTE — ED Triage Notes (Signed)
Pt having CP and SOB with productive cough that has been going on for the past few days getting worse with fevers at home.  ?

## 2022-04-11 LAB — COMPREHENSIVE METABOLIC PANEL
ALT: 17 U/L (ref 0–44)
AST: 17 U/L (ref 15–41)
Albumin: 3.7 g/dL (ref 3.5–5.0)
Alkaline Phosphatase: 164 U/L — ABNORMAL HIGH (ref 38–126)
Anion gap: 6 (ref 5–15)
BUN: 11 mg/dL (ref 6–20)
CO2: 24 mmol/L (ref 22–32)
Calcium: 9.1 mg/dL (ref 8.9–10.3)
Chloride: 111 mmol/L (ref 98–111)
Creatinine, Ser: 0.88 mg/dL (ref 0.44–1.00)
GFR, Estimated: >60 mL/min (ref >60)
Glucose, Bld: 142 mg/dL — ABNORMAL HIGH (ref 70–99)
Potassium: 3.9 mmol/L (ref 3.5–5.1)
Sodium: 141 mmol/L (ref 135–145)
Total Bilirubin: 0.7 mg/dL (ref 0.3–1.2)
Total Protein: 7 g/dL (ref 6.5–8.1)

## 2022-04-11 LAB — RESP PANEL BY RT-PCR (FLU A&B, COVID) ARPGX2
Influenza A by PCR: NEGATIVE
Influenza B by PCR: NEGATIVE
SARS Coronavirus 2 by RT PCR: NEGATIVE

## 2022-04-11 LAB — I-STAT BETA HCG BLOOD, ED (MC, WL, AP ONLY): I-stat hCG, quantitative: <5 m[IU]/mL (ref <5)

## 2022-04-11 LAB — TROPONIN I (HIGH SENSITIVITY)
Troponin I (High Sensitivity): 3 ng/L (ref <18)
Troponin I (High Sensitivity): 4 ng/L (ref <18)

## 2022-04-11 MED ORDER — DOXYCYCLINE HYCLATE 100 MG PO CAPS: 100.0000 mg | ORAL_CAPSULE | Freq: Two times a day (BID) | ORAL | 0 refills | Status: DC

## 2022-04-11 MED ORDER — ALBUTEROL SULFATE (2.5 MG/3ML) 0.083% IN NEBU: 2.5000 mg | INHALATION_SOLUTION | Freq: Four times a day (QID) | RESPIRATORY_TRACT | 1 refills | Status: DC | PRN

## 2022-04-11 MED ORDER — PREDNISONE 20 MG PO TABS: 20.0000 mg | ORAL_TABLET | Freq: Two times a day (BID) | ORAL | 0 refills | Status: DC

## 2022-04-11 NOTE — ED Notes (Signed)
Called for vitals x6 °

## 2022-04-11 NOTE — ED Notes (Signed)
Discharge instructions were given to pt. She acknowledged her understanding of the medications and follow up instructions. Esignature pad not available ?

## 2022-04-11 NOTE — ED Provider Notes (Signed)
?[facility] EMERGENCY DEPARTMENT ?Provider Note ? ? ?CSN: [id number] ?Arrival date & time: 04/10/22  2242 ? ?  ? ?History ? ?Chief Complaint  ?Patient presents with  ? Chest Pain  ? ? ?Tiffany [name] is a 51 y.o. female. ? ?HPI ?She complains of pain in right lower chest, with cough productive of green sputum and nasal congestion.  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  She denies fever, chills, vomiting or dizziness. ?  ? ?Home Medications ?Prior to Admission medications   ?Medication Sig Start Date End Date Taking? Authorizing Provider  ?albuterol (PROVENTIL) (2.5 MG/3ML) 0.083% nebulizer solution Take 3 mLs (2.5 mg total) by nebulization every 6 (six) hours as needed for wheezing or shortness of breath. 04/11/22  Yes Mancel Bale, MD  ?doxycycline (VIBRAMYCIN) 100 MG capsule Take 1 capsule (100 mg total) by mouth 2 (two) times daily. One po bid x 7 days 04/11/22  Yes Mancel Bale, MD  ?predniSONE (DELTASONE) 20 MG tablet Take 1 tablet (20 mg total) by mouth 2 (two) times daily. 04/11/22  Yes Mancel Bale, MD  ?acetaminophen (TYLENOL) 500 MG tablet Take 500 mg by mouth every 6 (six) hours as needed for mild pain or moderate pain.    [provider]  ?ALPRAZolam Prudy Feeler) 0.5 MG tablet Take 1 tablet (0.5 mg total) by mouth daily as needed for anxiety. ?Patient taking differently: Take 0.5 mg by mouth See admin instructions. 0.5mg  every night ?And 0.5mg  daily as needed for panic 10/03/16   Garnetta Buddy, [place]  ?DULoxetine (CYMBALTA) 60 MG capsule Take 1 capsule (60 mg total) by mouth daily. 10/03/16   Trena Platt D, PA  ?ibuprofen (ADVIL) 200 MG tablet Take 3 tablets (600 mg total) by mouth 4 (four) times daily. 12/08/21   Diamantina Monks, MD  ?methocarbamol (ROBAXIN-750) 750 MG tablet Take 1 tablet (750 mg total) by mouth 4 (four) times daily. 12/08/21   Diamantina Monks, MD  ?ondansetron (ZOFRAN) 4 MG tablet Take 1 tablet (4 mg total) by  mouth every 6 (six) hours. 12/10/21   Wynetta Fines, MD  ?oxyCODONE (ROXICODONE) 5 MG immediate release tablet Take 1 tablet (5 mg total) by mouth every 4 (four) hours as needed for severe pain. 12/08/21 12/08/22  Diamantina Monks, MD  ?   ? ?Allergies    ?Contrast media [iodinated contrast media], Stadol [butorphanol], and Other   ? ?Review of Systems   ?Review of Systems ? ?Physical Exam ?Updated Vital Signs ?BP (!) 141/97 (BP Location: Right Arm)   Pulse 95   Temp 97.6 ?F (36.4 ?C) (Oral)   Resp 19   SpO2 97%  ?Physical Exam ?Vitals and nursing note reviewed.  ?Constitutional:   ?   Appearance: She is well-developed. She is not ill-appearing.  ?HENT:  ?   Head: Normocephalic and atraumatic.  ?   Right Ear: External ear normal.  ?   Left Ear: External ear normal.  ?Eyes:  ?   Conjunctiva/sclera: Conjunctivae normal.  ?   Pupils: Pupils are equal, round, and reactive to light.  ?Neck:  ?   Trachea: Phonation normal.  ?Cardiovascular:  ?   Rate and Rhythm: Normal rate.  ?Pulmonary:  ?   Effort: Pulmonary effort is normal. No respiratory distress.  ?   Breath sounds: Normal breath sounds. No stridor. No wheezing or rhonchi.  ?Abdominal:  ?   Palpations: Abdomen is soft.  ?   Tenderness: There  is no abdominal tenderness.  ?Musculoskeletal:     ?   General: Normal range of motion.  ?   Cervical back: Normal range of motion and neck supple.  ?Skin: ?   General: Skin is warm and dry.  ?Neurological:  ?   Mental Status: She is alert and oriented to person, place, and time.  ?   Cranial Nerves: No cranial nerve deficit.  ?   Sensory: No sensory deficit.  ?   Motor: No abnormal muscle tone.  ?   Coordination: Coordination normal.  ?Psychiatric:     ?   Mood and Affect: Mood normal.     ?   Behavior: Behavior normal.     ?   Thought Content: Thought content normal.     ?   Judgment: Judgment normal.  ? ? ?ED Results / Procedures / Treatments   ?Labs ?(all labs ordered are listed, but only abnormal results are  displayed) ?Labs Reviewed  ?COMPREHENSIVE METABOLIC PANEL - Abnormal; Notable for the following components:  ?    Result Value  ? Glucose, Bld 142 (*)   ? Alkaline Phosphatase 164 (*)   ? All other components within normal limits  ?CBC WITH DIFFERENTIAL/PLATELET - Abnormal; Notable for the following components:  ? WBC 12.6 (*)   ? Neutro Abs 9.6 (*)   ? All other components within normal limits  ?RESP PANEL BY RT-PCR (FLU A&B, COVID) ARPGX2  ?I-STAT BETA HCG BLOOD, ED ([facility], [facility], [facility] ONLY)  ?TROPONIN I (HIGH SENSITIVITY)  ?TROPONIN I (HIGH SENSITIVITY)  ? ? ?EKG ?EKG Interpretation ? ?Date/Time:  Monday April 10 2022 22:59:37 EDT ?Ventricular Rate:  107 ?PR Interval:  154 ?QRS Duration: 70 ?QT Interval:  358 ?QTC Calculation: 477 ?R Axis:   23 ?Text Interpretation: Sinus tachycardia Otherwise normal ECG When compared with ECG of 10-Dec-2021 09:30, PREVIOUS ECG IS PRESENT since last tracing no significant change Confirmed by Mancel Bale [phone]) on 04/11/2022 8:11:26 AM ? ?Radiology ?DG Chest 2 View ? ?Result Date: 04/10/2022 ?CLINICAL DATA:  Chest pain and shortness of breath. EXAM: CHEST - 2 VIEW COMPARISON:  Chest x-ray 11/25/2021. FINDINGS: The heart size and mediastinal contours are within normal limits. Both lungs are clear. The visualized skeletal structures are unremarkable. IMPRESSION: No active cardiopulmonary disease. Electronically Signed   By: Darliss Cheney M.D.   On: 04/10/2022 23:36   ? ?Procedures ?Procedures  ? ? ?Medications Ordered in ED ?Medications - No data to display ? ?ED Course/ Medical Decision Making/ A&P ?Clinical Course as of 04/11/22 0825  ?Tue Apr 11, 2022  ?0810 Glucose(!): 142 [EW]  ?  ?Clinical Course User Index ?[EW] Mancel Bale, MD  ? ?                        ?Medical Decision Making ?Short-term illness with cough and nasal congestion and a smoker.  She works in [employer] is worried about getting exposure to a chemical that irritates the lungs. ? ?Problems  Addressed: ?Bronchitis: acute illness or injury ?   Details: Complicated by smoking ? ?Amount and/or Complexity of Data Reviewed ?Independent Historian:  ?   Details: Cogent historian ?Labs: ordered. ?   Details: CBC, metabolic panel, normal except white count high ?Radiology: ordered and independent interpretation performed. ?   Details: Chest x-ray, no infiltrate or edema ? ?Risk ?Prescription drug management. ?Decision regarding hospitalization. ?Risk Details: Smoker presenting with cough and nasal congestion, work-up is reassuring.  Doubt pneumonia, doubt viral infection, doubt PE.  No evidence for respiratory distress.  Patient requested a prescription for albuterol nebulizer.  We will also treat with doxycycline and prednisone for pneumonia complicated by tobacco abuse.  There is no indication for hospitalization at this time. ? ? ? ? ? ? ? ? ? ? ?Final Clinical Impression(s) / ED Diagnoses ?Final diagnoses:  ?Bronchitis  ? ? ?Rx / DC Orders ?ED Discharge Orders   ? ?      Ordered  ?  doxycycline (VIBRAMYCIN) 100 MG capsule  2 times daily       ? 04/11/22 [id number]  ?  predniSONE (DELTASONE) 20 MG tablet  2 times daily       ? 04/11/22 [id number]  ?  albuterol (PROVENTIL) (2.5 MG/3ML) 0.083% nebulizer solution  Every 6 hours PRN       ? 04/11/22 [id number]  ? ?  ?  ? ?  ? ? ?  ?Mancel BaleWentz, Christionna Poland, MD ?04/11/22 0827 ? ?

## 2022-04-11 NOTE — Discharge Instructions (Signed)
It appears that you have bronchitis either from a virus or toxic exposure.  Try to stop smoking.  Drink plenty of fluids.  Follow-up with your doctor if not better in a few days. ?

## 2022-08-16 ENCOUNTER — Emergency Department (HOSPITAL_COMMUNITY): Payer: Self-pay

## 2022-08-16 ENCOUNTER — Other Ambulatory Visit: Payer: Self-pay

## 2022-08-16 ENCOUNTER — Emergency Department (HOSPITAL_COMMUNITY)
Admission: EM | Admit: 2022-08-16 | Discharge: 2022-08-16 | Disposition: A | Discharge status: Home / Self Care | Payer: Self-pay | Attending: Emergency Medicine | Admitting: Emergency Medicine

## 2022-08-16 ENCOUNTER — Encounter (HOSPITAL_COMMUNITY): Payer: Self-pay

## 2022-08-16 DIAGNOSIS — R109 Unspecified abdominal pain: Secondary | ICD-10-CM

## 2022-08-16 DIAGNOSIS — N12 Tubulo-interstitial nephritis, not specified as acute or chronic: Secondary | ICD-10-CM | POA: Insufficient documentation

## 2022-08-16 LAB — CBC WITH DIFFERENTIAL/PLATELET
Abs Immature Granulocytes: 0.03 10*3/uL (ref 0.00–0.07)
Basophils Absolute: 0 10*3/uL (ref 0.0–0.1)
Basophils Relative: 0 %
Eosinophils Absolute: 0.2 10*3/uL (ref 0.0–0.5)
Eosinophils Relative: 2 %
HCT: 45.5 % (ref 36.0–46.0)
Hemoglobin: 15.2 g/dL — ABNORMAL HIGH (ref 12.0–15.0)
Immature Granulocytes: 0 %
Lymphocytes Relative: 24 %
Lymphs Abs: 2.2 10*3/uL (ref 0.7–4.0)
MCH: 31.2 pg (ref 26.0–34.0)
MCHC: 33.4 g/dL (ref 30.0–36.0)
MCV: 93.4 fL (ref 80.0–100.0)
Monocytes Absolute: 0.5 10*3/uL (ref 0.1–1.0)
Monocytes Relative: 5 %
Neutro Abs: 6.2 10*3/uL (ref 1.7–7.7)
Neutrophils Relative %: 69 %
Platelets: 246 10*3/uL (ref 150–400)
RBC: 4.87 MIL/uL (ref 3.87–5.11)
RDW: 12.6 % (ref 11.5–15.5)
WBC: 9.2 10*3/uL (ref 4.0–10.5)
nRBC: 0 % (ref 0.0–0.2)

## 2022-08-16 LAB — URINALYSIS, ROUTINE W REFLEX MICROSCOPIC
Bilirubin Urine: NEGATIVE
Glucose, UA: NEGATIVE mg/dL
Ketones, ur: NEGATIVE mg/dL
Nitrite: NEGATIVE
Protein, ur: NEGATIVE mg/dL
Specific Gravity, Urine: 1.019 (ref 1.005–1.030)
pH: 5 (ref 5.0–8.0)

## 2022-08-16 LAB — COMPREHENSIVE METABOLIC PANEL
ALT: 17 U/L (ref 0–44)
AST: 16 U/L (ref 15–41)
Albumin: 4.4 g/dL (ref 3.5–5.0)
Alkaline Phosphatase: 162 U/L — ABNORMAL HIGH (ref 38–126)
Anion gap: 7 (ref 5–15)
BUN: 10 mg/dL (ref 6–20)
CO2: 28 mmol/L (ref 22–32)
Calcium: 9.6 mg/dL (ref 8.9–10.3)
Chloride: 106 mmol/L (ref 98–111)
Creatinine, Ser: 0.91 mg/dL (ref 0.44–1.00)
GFR, Estimated: >60 mL/min (ref >60)
Glucose, Bld: 91 mg/dL (ref 70–99)
Potassium: 3.9 mmol/L (ref 3.5–5.1)
Sodium: 141 mmol/L (ref 135–145)
Total Bilirubin: 0.5 mg/dL (ref 0.3–1.2)
Total Protein: 7.9 g/dL (ref 6.5–8.1)

## 2022-08-16 LAB — LIPASE, BLOOD: Lipase: 31 U/L (ref 11–51)

## 2022-08-16 MED ORDER — CEPHALEXIN 500 MG PO CAPS
500.0000 mg | ORAL_CAPSULE | Freq: Once | ORAL | Status: AC
Start: 2022-08-16 — End: 2022-08-16
  Administered 2022-08-16: 500 mg via ORAL
  Filled 2022-08-16: qty 1

## 2022-08-16 MED ORDER — KETOROLAC TROMETHAMINE 15 MG/ML IJ SOLN
15.0000 mg | Freq: Once | INTRAMUSCULAR | Status: AC
Start: 2022-08-16 — End: 2022-08-16
  Administered 2022-08-16: 15 mg via INTRAMUSCULAR
  Filled 2022-08-16: qty 1

## 2022-08-16 MED ORDER — ONDANSETRON 4 MG PO TBDP
4.0000 mg | ORAL_TABLET | Freq: Three times a day (TID) | ORAL | 0 refills | Status: DC | PRN
Start: 2022-08-16 — End: 2022-10-05

## 2022-08-16 MED ORDER — OXYCODONE-ACETAMINOPHEN 5-325 MG PO TABS: 1.0000 | ORAL_TABLET | Freq: Four times a day (QID) | ORAL | 0 refills | Status: DC | PRN

## 2022-08-16 MED ORDER — CEPHALEXIN 500 MG PO CAPS: 500.0000 mg | ORAL_CAPSULE | Freq: Four times a day (QID) | ORAL | 0 refills | Status: AC

## 2022-08-16 NOTE — ED Triage Notes (Signed)
Pt reports R flank pain that began x2 weeks. Pt was told to come here by PCP for CT scan. Pt reports having a UTI that was treated with antibiotics in the last month.

## 2022-08-16 NOTE — ED Provider Notes (Signed)
[place] DEPT Provider Note   CSN: [id number] Arrival date & time: 08/16/22  1555     History  Chief Complaint  Patient presents with   Flank Pain    Tiffany [name] is a 51 y.o. female.  51 year old female presents today for evaluation of right-sided flank pain going on for about 2 weeks.  Denies dysuria, vaginal discharge, vaginal bleeding, change in sexual partner.  Endorses nausea but denies vomiting.  Denies fever, chills.  Denies prior history of kidney stone.  Has history of appendectomy, cholecystectomy.   The history is provided by the patient. No language interpreter was used.       Home Medications Prior to Admission medications   Medication Sig Start Date End Date Taking? Authorizing Provider  acetaminophen (TYLENOL) 500 MG tablet Take 500 mg by mouth every 6 (six) hours as needed for mild pain or moderate pain.    [provider]  albuterol (PROVENTIL) (2.5 MG/3ML) 0.083% nebulizer solution Take 3 mLs (2.5 mg total) by nebulization every 6 (six) hours as needed for wheezing or shortness of breath. 04/11/22   Daleen Bo, MD  ALPRAZolam Duanne Moron) 0.5 MG tablet Take 1 tablet (0.5 mg total) by mouth daily as needed for anxiety. Patient taking differently: Take 0.5 mg by mouth See admin instructions. 0.79m every night And 0.553mdaily as needed for panic 10/03/16   English, Stephanie D, PA  doxycycline (VIBRAMYCIN) 100 MG capsule Take 1 capsule (100 mg total) by mouth 2 (two) times daily. One po bid x 7 days 04/11/22   WeDaleen BoMD  DULoxetine (CYMBALTA) 60 MG capsule Take 1 capsule (60 mg total) by mouth daily. 10/03/16   EnIvar Drape, PA  ibuprofen (ADVIL) 200 MG tablet Take 3 tablets (600 mg total) by mouth 4 (four) times daily. 12/08/21   LoJesusita OkaMD  methocarbamol (ROBAXIN-750) 750 MG tablet Take 1 tablet (750 mg total) by mouth 4 (four) times daily. 12/08/21   LoJesusita OkaMD  ondansetron (ZOFRAN) 4 MG  tablet Take 1 tablet (4 mg total) by mouth every 6 (six) hours. 12/10/21   MeValarie MerinoMD  oxyCODONE (ROXICODONE) 5 MG immediate release tablet Take 1 tablet (5 mg total) by mouth every 4 (four) hours as needed for severe pain. 12/08/21 12/08/22  LoJesusita OkaMD  predniSONE (DELTASONE) 20 MG tablet Take 1 tablet (20 mg total) by mouth 2 (two) times daily. 04/11/22   WeDaleen BoMD      Allergies    Contrast media [iodinated contrast media], Stadol [butorphanol], and Other    Review of Systems   Review of Systems  Constitutional:  Negative for chills and fever.  Gastrointestinal:  Positive for abdominal pain and nausea. Negative for vomiting.  Genitourinary:  Positive for flank pain. Negative for decreased urine volume, difficulty urinating, dysuria, hematuria, pelvic pain, vaginal bleeding and vaginal discharge.  Neurological:  Negative for light-headedness.  All other systems reviewed and are negative.   Physical Exam Updated Vital Signs BP (!) 146/88 (BP Location: Left Arm)   Pulse 91   Temp 98.4 F (36.9 C) (Oral)   Resp 18   Ht 5' 3.5" (1.613 m)   Wt 110.3 kg   SpO2 98%   BMI 42.39 kg/m  Physical Exam Vitals and nursing note reviewed.  Constitutional:      General: She is not in acute distress.    Appearance: Normal appearance. She is not ill-appearing.  HENT:  Head: Normocephalic and atraumatic.     Nose: Nose normal.  Eyes:     General: No scleral icterus.    Extraocular Movements: Extraocular movements intact.     Conjunctiva/sclera: Conjunctivae normal.  Cardiovascular:     Rate and Rhythm: Normal rate and regular rhythm.     Pulses: Normal pulses.  Pulmonary:     Effort: Pulmonary effort is normal. No respiratory distress.     Breath sounds: Normal breath sounds. No wheezing or rales.  Abdominal:     General: There is no distension.     Palpations: Abdomen is soft.     Tenderness: There is abdominal tenderness (Right upper quadrant). There  is right CVA tenderness. There is no left CVA tenderness or guarding.  Musculoskeletal:        General: Normal range of motion.     Cervical back: Normal range of motion.  Skin:    General: Skin is warm and dry.  Neurological:     General: No focal deficit present.     Mental Status: She is alert. Mental status is at baseline.     ED Results / Procedures / Treatments   Labs (all labs ordered are listed, but only abnormal results are displayed) Labs Reviewed  URINALYSIS, ROUTINE W REFLEX MICROSCOPIC - Abnormal; Notable for the following components:      Result Value   APPearance HAZY (*)    Hgb urine dipstick SMALL (*)    Leukocytes,Ua MODERATE (*)    Bacteria, UA RARE (*)    All other components within normal limits  CBC WITH DIFFERENTIAL/PLATELET - Abnormal; Notable for the following components:   Hemoglobin 15.2 (*)    All other components within normal limits  COMPREHENSIVE METABOLIC PANEL - Abnormal; Notable for the following components:   Alkaline Phosphatase 162 (*)    All other components within normal limits  URINE CULTURE  LIPASE, BLOOD    EKG None  Radiology CT Renal Stone Study  Result Date: 08/16/2022 CLINICAL DATA:  Flank pain, kidney stone suspected Right flank pain for 2 weeks. EXAM: CT ABDOMEN AND PELVIS WITHOUT CONTRAST TECHNIQUE: Multidetector CT imaging of the abdomen and pelvis was performed following the standard protocol without IV contrast. RADIATION DOSE REDUCTION: This exam was performed according to the departmental dose-optimization program which includes automated exposure control, adjustment of the mA and/or kV according to patient size and/or use of iterative reconstruction technique. COMPARISON:  CT 12/10/2021 FINDINGS: Lower chest: Clear lung bases. Hepatobiliary: No evidence of focal hepatic abnormality on this unenhanced exam. Cholecystectomy. No biliary dilatation Pancreas: No ductal dilatation or inflammation. Spleen: Normal in size without  focal abnormality. Adrenals/Urinary Tract: No adrenal nodule. No urolithiasis. There is slight perinephric edema about the right kidney. Similar fullness of the right renal pelvis. No evidence of focal renal lesion. Urinary bladder is only minimally distended. Stomach/Bowel: Unremarkable CT appearance of the stomach. There is no small bowel obstruction or inflammation. Appendectomy. Small volume of colonic stool. Scattered colonic diverticula, no diverticulitis. Vascular/Lymphatic: Normal caliber abdominal aorta. No acute vascular findings on this unenhanced exam no adenopathy. Reproductive: Stable 8.5 cm left ovarian cyst measuring simple fluid density. There is no internal complexity on this unenhanced exam. This is unchanged in size from prior. Uterus is deviated to the right, otherwise normal. The right ovary is not well seen on the current exam. Other: No free air, free fluid, or intra-abdominal fluid collection. Small fat containing umbilical hernia. Musculoskeletal: Lower lumbar facet hypertrophy. Mild diffuse lumbar spondylosis  with spurring and mild disc space narrowing. Mild degenerative change of the pubic symphysis. There are no acute or suspicious osseous abnormalities. IMPRESSION: 1. Slight perinephric edema about the right kidney with mild fullness of the right renal pelvis. No urolithiasis. Findings may be due to recently passed stone or urinary tract infection. 2. Stable 8.5 cm left ovarian cyst since December. Recommend follow-up US in 6-12 months. Note: This recommendation does not apply to premenarchal patients and to those with increased risk (genetic, family history, elevated tumor markers or other high-risk factors) of ovarian cancer. Reference: JACR 2020 Feb; 17(2):248-254 3. Colonic diverticulosis without diverticulitis. Electronically Signed   By: Keith Rake M.D.   On: 08/16/2022 17:49    Procedures Procedures    Medications Ordered in ED Medications - No data to display  ED  Course/ Medical Decision Making/ A&P                           Medical Decision Making Amount and/or Complexity of Data Reviewed Labs: ordered. Radiology: ordered.   Medical Decision Making / ED Course   This patient presents to the ED for concern of right-sided flank pain, this involves an extensive number of treatment options, and is a complaint that carries with it a high risk of complications and morbidity.  The differential diagnosis includes nephrolithiasis, PID, pyelonephritis  MDM: 51 year old female presents today for evaluation of 2-week duration of right-sided flank pain.  She denies hematuria, vaginal discharge, vaginal bleeding, recent change in sexual partner.  Denies dysuria.  Low suspicion for PID given no change in sexual partners, and without vaginal discharge.  Will evaluate with blood work, CT renal stone study. CBC without leukocytosis.  Without anemia.  CMP with preserved renal function, without electrolyte derangements.  Alk phos elevated but otherwise liver functions are normal.  Likely nonspecific.  UA with moderate leukocytes, 21-50 WBCs.  Small amount of hemoglobin.  Lipase within normal limits.  CT renal stone study shows perinephric edema at the right kidney with mild fullness of the right renal pelvis.  No kidney stone.  Either recently passed kidney stone, or UTI.  We will send urine culture.  Will cover patient for pyelonephritis.  We will also prescribe patient pain medicine as well as Zofran to keep on hand for symptomatic management.  Patient was brought back to triage area to discuss results and discharge.  However patient appeared somewhat uncomfortable compared to initial exam.  Patient states she does not feel well however is also refusing to stay for additional work-up and management in the emergency room.  She requests to be discharged and she will follow-up closely with her PCP.  Strict return precautions discussed with patient.  She voices understanding and  is in agreement with plan.   Lab Tests: -I ordered, reviewed, and interpreted labs.   The pertinent results include:   Labs Reviewed  URINALYSIS, ROUTINE W REFLEX MICROSCOPIC - Abnormal; Notable for the following components:      Result Value   APPearance HAZY (*)    Hgb urine dipstick SMALL (*)    Leukocytes,Ua MODERATE (*)    Bacteria, UA RARE (*)    All other components within normal limits  CBC WITH DIFFERENTIAL/PLATELET - Abnormal; Notable for the following components:   Hemoglobin 15.2 (*)    All other components within normal limits  COMPREHENSIVE METABOLIC PANEL - Abnormal; Notable for the following components:   Alkaline Phosphatase 162 (*)  All other components within normal limits  URINE CULTURE  LIPASE, BLOOD      EKG  EKG Interpretation  Date/Time:    Ventricular Rate:    PR Interval:    QRS Duration:   QT Interval:    QTC Calculation:   R Axis:     Text Interpretation:           Imaging Studies ordered: I ordered imaging studies including renal stone study I independently visualized and interpreted imaging. I agree with the radiologist interpretation   Medicines ordered and prescription drug management: No orders of the defined types were placed in this encounter.   -I have reviewed the patients home medicines and have made adjustments as needed  Co morbidities that complicate the patient evaluation  Past Medical History:  Diagnosis Date   Allergy    Anxiety    Arthritis    CRPS (complex regional pain syndrome type I)    Depression    DES exposure in utero    SVT (supraventricular tachycardia) ([place])       Dispostion: Patient discharged in appropriate condition.  Return precautions discussed.  Patient voices understanding and is in agreement with plan.  Final Clinical Impression(s) / ED Diagnoses Final diagnoses:  Pyelonephritis  Flank pain    Rx / DC Orders ED Discharge Orders          Ordered    cephALEXin (KEFLEX) 500  MG capsule  4 times daily        08/16/22 2302    oxyCODONE-acetaminophen (PERCOCET/ROXICET) 5-325 MG tablet  Every 6 hours PRN        08/16/22 2302    ondansetron (ZOFRAN-ODT) 4 MG disintegrating tablet  Every 8 hours PRN        08/16/22 2302              Evlyn Courier, PA-C 08/16/22 2305    Dorie Rank, MD 08/24/22 [phone]

## 2022-08-16 NOTE — Discharge Instructions (Signed)
Your work-up today showed either a recently passed kidney stone, or kidney infection (pyelonephritis).  We did discuss additional work-up and management here in the emergency room.  You however did not want this and would like to be discharged to follow-up with your primary care provider.  Please return to the emergency room for any worsening in your symptoms.  I have sent pain medication, nausea medication into the pharmacy for you along with antibiotics for the kidney infection.  I have also sent a urine culture which will determine what bacteria is causing the infection if there is one.

## 2022-08-16 NOTE — ED Provider Triage Note (Signed)
Emergency Medicine Provider Triage Evaluation Note  Tiffany [name] , a 51 y.o. female  was evaluated in triage.  Pt complains of right-sided flank pain going on for about 2 weeks.  With associated nausea but no vomiting.  Denies dysuria, vaginal discharge, fever, or chills.  Review of Systems  Positive: As above Negative: As above  Physical Exam  BP (!) 146/88 (BP Location: Left Arm)   Pulse 91   Temp 98.4 F (36.9 C) (Oral)   Resp 18   Ht 5' 3.5" (1.613 m)   Wt 110.3 kg   SpO2 98%   BMI 42.39 kg/m  Gen:   Awake, no distress   Resp:  Normal effort MSK:   Moves extremities without difficulty  Other:  Mild right upper quadrant abdominal tenderness, right flank tenderness present  Medical Decision Making  Medically screening exam initiated at 4:58 PM.  Appropriate orders placed.  Tiffany [name] was informed that the remainder of the evaluation will be completed by another provider, this initial triage assessment does not replace that evaluation, and the importance of remaining in the ED until their evaluation is complete.     Marita Kansas, PA-C 08/16/22 1700

## 2022-08-18 LAB — URINE CULTURE

## 2022-10-03 ENCOUNTER — Encounter (HOSPITAL_COMMUNITY): Payer: Self-pay | Admitting: Emergency Medicine

## 2022-10-03 ENCOUNTER — Other Ambulatory Visit: Payer: Self-pay

## 2022-10-03 ENCOUNTER — Observation Stay (HOSPITAL_COMMUNITY)
Admission: EM | Admit: 2022-10-03 | Discharge: 2022-10-05 | Disposition: A | Discharge status: Home / Self Care | Payer: BC Managed Care – PPO | Attending: Family Medicine | Admitting: Family Medicine

## 2022-10-03 ENCOUNTER — Emergency Department (HOSPITAL_COMMUNITY): Payer: BC Managed Care – PPO

## 2022-10-03 DIAGNOSIS — G253 Myoclonus: Secondary | ICD-10-CM | POA: Insufficient documentation

## 2022-10-03 DIAGNOSIS — R4701 Aphasia: Secondary | ICD-10-CM | POA: Insufficient documentation

## 2022-10-03 DIAGNOSIS — R569 Unspecified convulsions: Principal | ICD-10-CM

## 2022-10-03 DIAGNOSIS — R55 Syncope and collapse: Principal | ICD-10-CM | POA: Insufficient documentation

## 2022-10-03 DIAGNOSIS — Z87891 Personal history of nicotine dependence: Secondary | ICD-10-CM | POA: Diagnosis not present

## 2022-10-03 DIAGNOSIS — Z6841 Body Mass Index (BMI) 40.0 and over, adult: Secondary | ICD-10-CM | POA: Insufficient documentation

## 2022-10-03 DIAGNOSIS — Z79899 Other long term (current) drug therapy: Secondary | ICD-10-CM | POA: Diagnosis not present

## 2022-10-03 DIAGNOSIS — F445 Conversion disorder with seizures or convulsions: Secondary | ICD-10-CM

## 2022-10-03 DIAGNOSIS — R29818 Other symptoms and signs involving the nervous system: Secondary | ICD-10-CM | POA: Diagnosis present

## 2022-10-03 DIAGNOSIS — R404 Transient alteration of awareness: Secondary | ICD-10-CM | POA: Diagnosis present

## 2022-10-03 LAB — CBC WITH DIFFERENTIAL/PLATELET
Abs Immature Granulocytes: 0.04 10*3/uL (ref 0.00–0.07)
Basophils Absolute: 0 10*3/uL (ref 0.0–0.1)
Basophils Relative: 0 %
Eosinophils Absolute: 0.3 10*3/uL (ref 0.0–0.5)
Eosinophils Relative: 3 %
HCT: 45.2 % (ref 36.0–46.0)
Hemoglobin: 15.2 g/dL — ABNORMAL HIGH (ref 12.0–15.0)
Immature Granulocytes: 0 %
Lymphocytes Relative: 27 %
Lymphs Abs: 2.7 10*3/uL (ref 0.7–4.0)
MCH: 31 pg (ref 26.0–34.0)
MCHC: 33.6 g/dL (ref 30.0–36.0)
MCV: 92.1 fL (ref 80.0–100.0)
Monocytes Absolute: 0.4 10*3/uL (ref 0.1–1.0)
Monocytes Relative: 4 %
Neutro Abs: 6.5 10*3/uL (ref 1.7–7.7)
Neutrophils Relative %: 66 %
Platelets: 256 10*3/uL (ref 150–400)
RBC: 4.91 MIL/uL (ref 3.87–5.11)
RDW: 12.6 % (ref 11.5–15.5)
WBC: 9.9 10*3/uL (ref 4.0–10.5)
nRBC: 0 % (ref 0.0–0.2)

## 2022-10-03 LAB — CBG MONITORING, ED: Glucose-Capillary: 103 mg/dL — ABNORMAL HIGH (ref 70–99)

## 2022-10-03 LAB — COMPREHENSIVE METABOLIC PANEL
ALT: 18 U/L (ref 0–44)
AST: 19 U/L (ref 15–41)
Albumin: 4.3 g/dL (ref 3.5–5.0)
Alkaline Phosphatase: 195 U/L — ABNORMAL HIGH (ref 38–126)
Anion gap: 8 (ref 5–15)
BUN: 12 mg/dL (ref 6–20)
CO2: 23 mmol/L (ref 22–32)
Calcium: 9.3 mg/dL (ref 8.9–10.3)
Chloride: 110 mmol/L (ref 98–111)
Creatinine, Ser: 0.82 mg/dL (ref 0.44–1.00)
GFR, Estimated: >60 mL/min (ref >60)
Glucose, Bld: 110 mg/dL — ABNORMAL HIGH (ref 70–99)
Potassium: 3.5 mmol/L (ref 3.5–5.1)
Sodium: 141 mmol/L (ref 135–145)
Total Bilirubin: 0.8 mg/dL (ref 0.3–1.2)
Total Protein: 8 g/dL (ref 6.5–8.1)

## 2022-10-03 NOTE — ED Notes (Signed)
Pt has urine sample at triage desk.

## 2022-10-03 NOTE — ED Provider Triage Note (Signed)
Emergency Medicine Provider Triage Evaluation Note  Tiffany [name] , a 51 y.o. female  was evaluated in triage.  Pt complains of seizure-like activity.  Patient is coming by daughter who is primary historian.  Daughter states that symptoms began approximate 20 minutes ago.  Mother was stated to be found in her basement exhibiting jerking-like motion.  She presents emergency department in similar fashion.  Patient is tremulous but upright in wheelchair.  She is understanding to verbal communication and types on phone with coherent responses but is not speaking.  She denies taking any medicines for seizures.  They have been unable to establish care with a neurologist outpatient despite referrals.  Review of Systems  Positive: See above Negative:   Physical Exam  BP (!) 184/98   Pulse 100   Temp 97.6 F (36.4 C) (Oral)   Resp 18   Ht 5' 3.5" (1.613 m)   Wt 110.2 kg   SpO2 96%   BMI 42.37 kg/m  Gen:   Awake, no distress   Resp:  Normal effort  MSK:   Moves extremities without difficulty  Other:  Patient tremulous on exam.  She is not speaking but is able to communicate via typing on phone.  Medical Decision Making  Medically screening exam initiated at 10:05 PM.  Appropriate orders placed.  [name] was informed that the remainder of the evaluation will be completed by another provider, this initial triage assessment does not replace that evaluation, and the importance of remaining in the ED until their evaluation is complete.     Wilnette Kales, [place] 10/03/22 2208

## 2022-10-03 NOTE — ED Notes (Signed)
Pt unable to answer triage questions due to inability to speak but shakes her head yes and no

## 2022-10-03 NOTE — ED Triage Notes (Signed)
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

## 2022-10-03 NOTE — ED Provider Notes (Signed)
[place] DEPT Provider Note   CSN: [id number] Arrival date & time: 10/03/22  2139     History {Add pertinent medical, surgical, social history, OB history to HPI:1} Chief Complaint  Patient presents with   Seizures    Tiffany [name] is a 51 y.o. female.  HPI     This is a 51 year old female with a history of chronic regional pain syndrome, SVT who presents with reported seizure-like episodes.  Daughter provides most of the history.  Reports over the last year or so patient has had progressive episodes where she appears to be having a seizure.  She describes full body shaking.  The patient is aware during these episodes.  She cannot speak during these episodes.  They last for minutes and sometimes the speech does not return for up to 12 hours.  She had an episode today similar.  She has not been able to speak.  This started around 9:30 PM.  She is able to communicate by typing on the phone.  She nods her head that she understands what is being said.  She reports tingling on the left side of the body and pins and needle sensation in the head.  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  Home Medications Prior to Admission medications   Medication Sig Start Date End Date Taking? Authorizing Provider  ALPRAZolam Duanne Moron) 0.5 MG tablet Take 1 tablet (0.5 mg total) by mouth daily as needed for anxiety. Patient taking differently: Take 0.5 mg by mouth See admin instructions. 0.5mg  every night And 0.5mg  daily as needed for panic 10/03/16  Yes English, Stephanie D, PA  diltiazem (CARDIZEM LA) 120 MG 24 hr tablet Take 120 mg by mouth daily. 09/24/22  Yes [provider]  DULoxetine (CYMBALTA) 60 MG capsule Take 1 capsule (60 mg total) by mouth daily. 10/03/16  Yes English, Colletta Maryland D, PA  ibuprofen (ADVIL) 200 MG tablet Take 3 tablets (600 mg total) by mouth 4 (four) times daily. 12/08/21   Yes Jesusita Oka, MD  albuterol (PROVENTIL) (2.5 MG/3ML) 0.083% nebulizer solution Take 3 mLs (2.5 mg total) by nebulization every 6 (six) hours as needed for wheezing or shortness of breath. 04/11/22   Daleen Bo, MD  doxycycline (VIBRAMYCIN) 100 MG capsule Take 1 capsule (100 mg total) by mouth 2 (two) times daily. One po bid x 7 days Patient not taking: Reported on 10/03/2022 04/11/22   Daleen Bo, MD  methocarbamol (ROBAXIN-750) 750 MG tablet Take 1 tablet (750 mg total) by mouth 4 (four) times daily. Patient not taking: Reported on 10/03/2022 12/08/21   Jesusita Oka, MD  ondansetron (ZOFRAN) 4 MG tablet Take 1 tablet (4 mg total) by mouth every 6 (six) hours. Patient not taking: Reported on 10/03/2022 12/10/21   Valarie Merino, MD  ondansetron (ZOFRAN-ODT) 4 MG disintegrating tablet Take 1 tablet (4 mg total) by mouth every 8 (eight) hours as needed. Patient not taking: Reported on 10/03/2022 08/16/22   Evlyn Courier, PA-C  oxyCODONE-acetaminophen (PERCOCET/ROXICET) 5-325 MG tablet Take 1 tablet by mouth every 6 (six) hours as needed for severe pain. Patient not taking: Reported on 10/03/2022 08/16/22   Evlyn Courier, PA-C  predniSONE (DELTASONE) 20 MG tablet Take 1 tablet (20 mg total) by mouth 2 (two) times daily. Patient not taking: Reported on 10/03/2022 04/11/22   Daleen Bo, MD      Allergies    Contrast  media [iodinated contrast media], Stadol [butorphanol], and Other    Review of Systems   Review of Systems  Neurological:  Positive for seizures and speech difficulty. Negative for syncope and weakness.  All other systems reviewed and are negative.   Physical Exam Updated Vital Signs BP (!) 184/98   Pulse 100   Temp 97.6 F (36.4 C) (Oral)   Resp 18   Ht 1.613 m (5' 3.5")   Wt 110.2 kg   SpO2 96%   BMI 42.37 kg/m  Physical Exam Vitals and nursing note reviewed.  Constitutional:      Appearance: She is well-developed. She is obese. She is not  ill-appearing.     Comments: Tearful  HENT:     Head: Normocephalic and atraumatic.     Nose: Nose normal.  Eyes:     Extraocular Movements: Extraocular movements intact.     Pupils: Pupils are equal, round, and reactive to light.  Cardiovascular:     Rate and Rhythm: Normal rate and regular rhythm.     Heart sounds: Normal heart sounds.  Pulmonary:     Effort: Pulmonary effort is normal. No respiratory distress.     Breath sounds: No wheezing.  Abdominal:     Palpations: Abdomen is soft.  Musculoskeletal:     Cervical back: Neck supple.  Skin:    General: Skin is warm and dry.  Neurological:     Mental Status: She is alert and oriented to person, place, and time.     Comments: Receptive language appears intact, patient can write clearly and communicate by typing, she follows commands, she moves all 4 extremities, no dysmetria to finger-nose-finger, 5 out of 5 strength in all 4 extremities  Psychiatric:        Mood and Affect: Mood normal.     ED Results / Procedures / Treatments   Labs (all labs ordered are listed, but only abnormal results are displayed) Labs Reviewed  COMPREHENSIVE METABOLIC PANEL - Abnormal; Notable for the following components:      Result Value   Glucose, Bld 110 (*)    Alkaline Phosphatase 195 (*)    All other components within normal limits  CBC WITH DIFFERENTIAL/PLATELET - Abnormal; Notable for the following components:   Hemoglobin 15.2 (*)    All other components within normal limits  CBG MONITORING, ED    EKG None  Radiology No results found.  Procedures Procedures  {Document cardiac monitor, telemetry assessment procedure when appropriate:1}  Medications Ordered in ED Medications - No data to display  ED Course/ Medical Decision Making/ A&P                           Medical Decision Making Amount and/or Complexity of Data Reviewed Labs: ordered. Radiology: ordered.   ***  {Document critical care time when  appropriate:1} {Document review of labs and clinical decision tools ie heart score, Chads2Vasc2 etc:1}  {Document your independent review of radiology images, and any outside records:1} {Document your discussion with family members, caretakers, and with consultants:1} {Document social determinants of health affecting pt's care:1} {Document your decision making why or why not admission, treatments were needed:1} Final Clinical Impression(s) / ED Diagnoses Final diagnoses:  None    Rx / DC Orders ED Discharge Orders     None

## 2022-10-04 ENCOUNTER — Observation Stay (HOSPITAL_COMMUNITY)
Admit: 2022-10-04 | Discharge: 2022-10-04 | Disposition: A | Discharge status: Home / Self Care | Payer: BC Managed Care – PPO | Attending: Internal Medicine | Admitting: Internal Medicine

## 2022-10-04 ENCOUNTER — Inpatient Hospital Stay (HOSPITAL_COMMUNITY): Payer: BC Managed Care – PPO

## 2022-10-04 DIAGNOSIS — R4701 Aphasia: Secondary | ICD-10-CM

## 2022-10-04 DIAGNOSIS — R404 Transient alteration of awareness: Secondary | ICD-10-CM | POA: Diagnosis not present

## 2022-10-04 DIAGNOSIS — R569 Unspecified convulsions: Secondary | ICD-10-CM | POA: Diagnosis not present

## 2022-10-04 DIAGNOSIS — G253 Myoclonus: Secondary | ICD-10-CM | POA: Diagnosis not present

## 2022-10-04 LAB — CBC
HCT: 42.8 % (ref 36.0–46.0)
Hemoglobin: 14.3 g/dL (ref 12.0–15.0)
MCH: 30.8 pg (ref 26.0–34.0)
MCHC: 33.4 g/dL (ref 30.0–36.0)
MCV: 92.2 fL (ref 80.0–100.0)
Platelets: 218 10*3/uL (ref 150–400)
RBC: 4.64 MIL/uL (ref 3.87–5.11)
RDW: 12.7 % (ref 11.5–15.5)
WBC: 7.6 10*3/uL (ref 4.0–10.5)
nRBC: 0 % (ref 0.0–0.2)

## 2022-10-04 LAB — BLOOD GAS, ARTERIAL
Acid-Base Excess: 0.7 mmol/L (ref 0.0–2.0)
Bicarbonate: 25.4 mmol/L (ref 20.0–28.0)
Drawn by: 54496
O2 Saturation: 99.1 %
Patient temperature: 36.8
pCO2 arterial: 40 mmHg (ref 32–48)
pH, Arterial: 7.41 (ref 7.35–7.45)
pO2, Arterial: 96 mmHg (ref 83–108)

## 2022-10-04 LAB — RESPIRATORY PANEL BY PCR

## 2022-10-04 LAB — CBG MONITORING, ED: Glucose-Capillary: 139 mg/dL — ABNORMAL HIGH (ref 70–99)

## 2022-10-04 LAB — CK: Total CK: 89 U/L (ref 38–234)

## 2022-10-04 LAB — C-REACTIVE PROTEIN: CRP: 0.9 mg/dL (ref <1.0)

## 2022-10-04 LAB — SEDIMENTATION RATE: Sed Rate: 26 mm/hr — ABNORMAL HIGH (ref 0–22)

## 2022-10-04 LAB — CREATININE, SERUM
Creatinine, Ser: 0.83 mg/dL (ref 0.44–1.00)
GFR, Estimated: >60 mL/min (ref >60)

## 2022-10-04 MED ORDER — ACETAMINOPHEN 325 MG PO TABS
650.0000 mg | ORAL_TABLET | ORAL | Status: DC | PRN
  Administered 2022-10-04: 650 mg via ORAL
  Filled 2022-10-04: qty 2

## 2022-10-04 MED ORDER — ONDANSETRON HCL 4 MG/2ML IJ SOLN: 4.0000 mg | Freq: Four times a day (QID) | INTRAMUSCULAR | Status: DC | PRN

## 2022-10-04 MED ORDER — HEPARIN SODIUM (PORCINE) 5000 UNIT/ML IJ SOLN
5000.0000 [IU] | Freq: Three times a day (TID) | INTRAMUSCULAR | Status: DC
  Administered 2022-10-04 – 2022-10-05 (×5): 5000 [IU] via SUBCUTANEOUS
  Filled 2022-10-04 (×5): qty 1

## 2022-10-04 MED ORDER — DULOXETINE HCL 60 MG PO CPEP
60.0000 mg | ORAL_CAPSULE | Freq: Every day | ORAL | Status: DC
  Administered 2022-10-04: 60 mg via ORAL
  Filled 2022-10-04: qty 1

## 2022-10-04 MED ORDER — LORAZEPAM 2 MG/ML IJ SOLN: 4.0000 mg | INTRAMUSCULAR | Status: DC | PRN

## 2022-10-04 MED ORDER — ALPRAZOLAM 0.5 MG PO TABS
0.5000 mg | ORAL_TABLET | Freq: Every day | ORAL | Status: DC | PRN
  Administered 2022-10-04: 0.5 mg via ORAL
  Filled 2022-10-04: qty 1

## 2022-10-04 MED ORDER — ONDANSETRON HCL 4 MG PO TABS: 4.0000 mg | ORAL_TABLET | Freq: Four times a day (QID) | ORAL | Status: DC | PRN

## 2022-10-04 MED ORDER — DILTIAZEM HCL ER COATED BEADS 120 MG PO CP24
120.0000 mg | ORAL_CAPSULE | Freq: Every day | ORAL | Status: DC
  Administered 2022-10-04 – 2022-10-05 (×2): 120 mg via ORAL
  Filled 2022-10-04 (×2): qty 1

## 2022-10-04 MED ORDER — ACETAMINOPHEN 650 MG RE SUPP: 650.0000 mg | RECTAL | Status: DC | PRN

## 2022-10-04 MED ORDER — DULOXETINE HCL 30 MG PO CPEP
60.0000 mg | ORAL_CAPSULE | Freq: Every day | ORAL | Status: DC
  Filled 2022-10-04: qty 2

## 2022-10-04 NOTE — ED Notes (Signed)
EEG at bedside.

## 2022-10-04 NOTE — H&P (Addendum)
History and Physical    Tiffany [name] SFS:[id number] DOB: 1971-03-04 DOA: 10/03/2022  PCP: System, Provider Not In  Patient coming from: home  I have personally briefly reviewed patient's old medical records in [facility]  Chief Complaint: whole body shaking episodes associated with inability to speak  HPI: Tiffany [name] is a 51 y.o. female with medical history significant of SVT,Anxiety,Depression, obesity  who presents to ED with history of whole body shaking episodes associated with inability to speak. Patient states she has had these episodes since 2020  s/p viral infectionbut did not seek medical evaluation at that time as she was not insured. She states however over the last few weeks these episodes have become more frequent. She describes the episode she had prior to presenting to ED as whole body shaking which she was unable to control. She also notes she is aware of what is occuring but is unable to control the shaking and  unable to speak. She is able to communicate however with writing.  She states episode lasted around 15 minutes. She notes no fever/chills/ sob chest pain / + nausea no emesis/ no abdominal pain.  ED Course:  Patient was discussed with neurology  who noted NIHSS of 8  but clear if this was effort driven. Currently per neurology symptoms may be more inline with stress response. However, due to questionable exam ,recommended admission to MRI,EEG. Per neurology if these exams are negative , patient should be referred  to outpatient neurology for  follow up. If + inpatient neurology evaluation is necessary.    Vitals: Afb, bp 167/129/ hr 100, rr 18 sat 96%   Labs: Wbc 9.9, hgb 15.2 , plt 256 N a141, K 3.5, gly 110, cr 0.82 CTH: NAD Ekg: nsr  Review of Systems: As per HPI otherwise 10 point review of systems negative.   Past Medical History:  Diagnosis Date   Allergy    Anxiety    Arthritis    CRPS (complex regional pain syndrome type I)    Depression     DES exposure in utero    SVT (supraventricular tachycardia)     Past Surgical History:  Procedure Laterality Date   CESAREAN SECTION     x2   CHOLECYSTECTOMY     LAPAROSCOPIC APPENDECTOMY N/A 12/08/2021   Procedure: APPENDECTOMY LAPAROSCOPIC;  Surgeon: Diamantina Monks, MD;  Location: [facility] OR;  Service: General;  Laterality: N/A;   SALPINGOSTOMY     due to ectopic pregnancy     reports that she has quit smoking. Her smoking use included cigarettes. She has a 7.50 pack-year smoking history. She has never used smokeless tobacco. She reports current alcohol use. She reports that she does not use drugs.  Allergies  Allergen Reactions   Contrast Media [Iodinated Contrast Media] Shortness Of Breath   Stadol [Butorphanol] Shortness Of Breath   Other     Beta blockers exacerbate asthma     Family History  Problem Relation Age of Onset   Heart disease Mother    Hyperlipidemia Mother    Hypertension Mother    Mental illness Mother    COPD Mother    Mental illness Father        bipolar   Hyperlipidemia Sister    Hypertension Sister    Supraventricular tachycardia Sister    Hypertension Brother    Cancer Maternal Grandmother    Heart disease Maternal Grandmother    Hyperlipidemia Maternal Grandmother    Hypertension Maternal Grandmother  Stroke Maternal Grandmother    Supraventricular tachycardia Sister    Arthritis Daughter        JRA   Arthritis Daughter        JRA    Prior to Admission medications   Medication Sig Start Date End Date Taking? Authorizing Provider  ALPRAZolam Prudy Feeler) 0.5 MG tablet Take 1 tablet (0.5 mg total) by mouth daily as needed for anxiety. Patient taking differently: Take 0.5 mg by mouth See admin instructions. 0.5mg  every night And 0.5mg  daily as needed for panic 10/03/16  Yes English, Stephanie D, PA  diltiazem (CARDIZEM LA) 120 MG 24 hr tablet Take 120 mg by mouth daily. 09/24/22  Yes [provider]  DULoxetine (CYMBALTA) 60 MG capsule  Take 1 capsule (60 mg total) by mouth daily. 10/03/16  Yes English, Judeth Cornfield D, PA  ibuprofen (ADVIL) 200 MG tablet Take 3 tablets (600 mg total) by mouth 4 (four) times daily. 12/08/21  Yes Diamantina Monks, MD  albuterol (PROVENTIL) (2.5 MG/3ML) 0.083% nebulizer solution Take 3 mLs (2.5 mg total) by nebulization every 6 (six) hours as needed for wheezing or shortness of breath. 04/11/22   Mancel Bale, MD  doxycycline (VIBRAMYCIN) 100 MG capsule Take 1 capsule (100 mg total) by mouth 2 (two) times daily. One po bid x 7 days Patient not taking: Reported on 10/03/2022 04/11/22   Mancel Bale, MD  methocarbamol (ROBAXIN-750) 750 MG tablet Take 1 tablet (750 mg total) by mouth 4 (four) times daily. Patient not taking: Reported on 10/03/2022 12/08/21   Diamantina Monks, MD  ondansetron (ZOFRAN) 4 MG tablet Take 1 tablet (4 mg total) by mouth every 6 (six) hours. Patient not taking: Reported on 10/03/2022 12/10/21   Wynetta Fines, MD  ondansetron (ZOFRAN-ODT) 4 MG disintegrating tablet Take 1 tablet (4 mg total) by mouth every 8 (eight) hours as needed. Patient not taking: Reported on 10/03/2022 08/16/22   Marita Kansas, PA-C  oxyCODONE-acetaminophen (PERCOCET/ROXICET) 5-325 MG tablet Take 1 tablet by mouth every 6 (six) hours as needed for severe pain. Patient not taking: Reported on 10/03/2022 08/16/22   Marita Kansas, PA-C  predniSONE (DELTASONE) 20 MG tablet Take 1 tablet (20 mg total) by mouth 2 (two) times daily. Patient not taking: Reported on 10/03/2022 04/11/22   Mancel Bale, MD    Physical Exam: Vitals:   10/04/22 0130 10/04/22 0154 10/04/22 0200 10/04/22 0230  BP: (!) 150/84  (!) 150/85 (!) 153/81  Pulse: 74  72 78  Resp: 18  19 (!) 21  Temp:  97.6 F (36.4 C) 98 F (36.7 C)   TempSrc:   Oral   SpO2: 99%  97% 94%  Weight:      Height:        Constitutional: NAD, calm, comfortable Vitals:   10/04/22 0130 10/04/22 0154 10/04/22 0200 10/04/22 0230  BP: (!) 150/84  (!) 150/85  (!) 153/81  Pulse: 74  72 78  Resp: 18  19 (!) 21  Temp:  97.6 F (36.4 C) 98 F (36.7 C)   TempSrc:   Oral   SpO2: 99%  97% 94%  Weight:      Height:       Eyes: PERRL, lids and conjunctivae normal ENMT: Mucous membranes are moist. Posterior pharynx clear of any exudate or lesions.Normal dentition.  Neck: normal, supple, no masses, no thyromegaly Respiratory: clear to auscultation bilaterally, no wheezing, no crackles. Normal respiratory effort. No accessory muscle use.  Cardiovascular: Regular rate and rhythm, no murmurs /  rubs / gallops. No extremity edema. 2+ pedal pulses. No carotid bruits.  Abdomen: no tenderness, no masses palpated. No hepatosplenomegaly. Bowel sounds positive.  Musculoskeletal: no clubbing / cyanosis. No joint deformity upper and lower extremities. Good ROM, no contractures. Normal muscle tone.  Skin: no rashes, lesions, ulcers. No induration Neurologic: CN 2-12 grossly intact. Sensation intact, Strength 5/5 in all 4.  Mild word finding difficulties  but  noted clear speech.  Psychiatric: Normal judgment and insight. Alert and oriented x 3. Normal mood.    Labs on Admission: I have personally reviewed following labs and imaging studies  CBC: Recent Labs  Lab 10/03/22 2216  WBC 9.9  NEUTROABS 6.5  HGB 15.2*  HCT 45.2  MCV 92.1  PLT 256   Basic Metabolic Panel: Recent Labs  Lab 10/03/22 2216  NA 141  K 3.5  CL 110  CO2 23  GLUCOSE 110*  BUN 12  CREATININE 0.82  CALCIUM 9.3   GFR: Estimated Creatinine Clearance: 97.6 mL/min (by C-G formula based on SCr of 0.82 mg/dL). Liver Function Tests: Recent Labs  Lab 10/03/22 2216  AST 19  ALT 18  ALKPHOS 195*  BILITOT 0.8  PROT 8.0  ALBUMIN 4.3   No results for input(s): "LIPASE", "AMYLASE" in the last 168 hours. No results for input(s): "AMMONIA" in the last 168 hours. Coagulation Profile: No results for input(s): "INR", "PROTIME" in the last 168 hours. Cardiac Enzymes: No results for  input(s): "CKTOTAL", "CKMB", "CKMBINDEX", "TROPONINI" in the last 168 hours. BNP (last 3 results) No results for input(s): "PROBNP" in the last 8760 hours. HbA1C: No results for input(s): "HGBA1C" in the last 72 hours. CBG: Recent Labs  Lab 10/03/22 2345  GLUCAP 103*   Lipid Profile: No results for input(s): "CHOL", "HDL", "LDLCALC", "TRIG", "CHOLHDL", "LDLDIRECT" in the last 72 hours. Thyroid Function Tests: No results for input(s): "TSH", "T4TOTAL", "FREET4", "T3FREE", "THYROIDAB" in the last 72 hours. Anemia Panel: No results for input(s): "VITAMINB12", "FOLATE", "FERRITIN", "TIBC", "IRON", "RETICCTPCT" in the last 72 hours. Urine analysis:    Component Value Date/Time   COLORURINE YELLOW 08/16/2022 1656   APPEARANCEUR HAZY (A) 08/16/2022 1656   LABSPEC 1.019 08/16/2022 1656   PHURINE 5.0 08/16/2022 1656   GLUCOSEU NEGATIVE 08/16/2022 1656   HGBUR SMALL (A) 08/16/2022 1656   BILIRUBINUR NEGATIVE 08/16/2022 1656   BILIRUBINUR neg 11/10/2013 1415   KETONESUR NEGATIVE 08/16/2022 1656   PROTEINUR NEGATIVE 08/16/2022 1656   UROBILINOGEN 0.2 11/10/2013 1415   NITRITE NEGATIVE 08/16/2022 1656   LEUKOCYTESUR MODERATE (A) 08/16/2022 1656    Radiological Exams on Admission: CT Head Wo Contrast  Result Date: 10/04/2022 CLINICAL DATA:  Seizure EXAM: CT HEAD WITHOUT CONTRAST TECHNIQUE: Contiguous axial images were obtained from the base of the skull through the vertex without intravenous contrast. RADIATION DOSE REDUCTION: This exam was performed according to the departmental dose-optimization program which includes automated exposure control, adjustment of the mA and/or kV according to patient size and/or use of iterative reconstruction technique. COMPARISON:  11/22/2020 FINDINGS: Brain: No evidence of acute infarction, hemorrhage, hydrocephalus, extra-axial collection or mass lesion/mass effect. Vascular: No hyperdense vessel or unexpected calcification. Skull: Normal. Negative for  fracture or focal lesion. Sinuses/Orbits: The visualized paranasal sinuses are essentially clear. The mastoid air cells are unopacified. Other: None. IMPRESSION: Normal head CT. Electronically Signed   By: Charline Bills M.D.   On: 10/04/2022 00:00    EKG: Independently reviewed. 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  -per patient symptoms initially began 2020 after viral infection.  SVT -no active issues  -continue diltiazem  Anxiety  -resume home regimen     DVT prophylaxis:  Heparin Code Status: full Family Communication: n/a Disposition Plan: patient  expected to be admitted less than 2 midnights  Consults called: n/a Admission status: progressive   Clance Boll MD [facility]  If 7PM-7AM, please contact night-coverage [email or web address] Password [email or web address]  10/04/2022, 5:19 AM

## 2022-10-04 NOTE — Procedures (Signed)
Patient Name: EDITHA BRIDGEFORTH  MRN: [id number]  Epilepsy Attending: Lora Havens  Referring Physician/Provider: Clance Boll, MD Date: 10/04/2022 Duration: 36.49 mins  Patient history: 51 yo RH F with h/o anxiety, arthritis, complex regional pain syndrome, depression, SVT, presenting with seizure-like activity (described as whole body shaking all at once without evolution and with retained awareness) and inability to speak (mute though intact comprehension and able to express herself through typing and motions). EEG to evaluate for seizure  Level of alertness: Awake  AEDs during EEG study: None  Technical aspects: This EEG study was done with scalp electrodes positioned according to the 10-20 International system of electrode placement. Electrical activity was reviewed with band pass filter of 1-70Hz , sensitivity of 7 uV/mm, display speed of 43mm/sec with a 60Hz  notched filter applied as appropriate. EEG data were recorded continuously and digitally stored.  Video monitoring was available and reviewed as appropriate.  Description: The posterior dominant rhythm consists of 9-10 Hz activity of moderate voltage (25-35 uV) seen predominantly in posterior head regions, symmetric and reactive to eye opening and eye closing.   Event button was pressed on 10/04/2022 at 1424. During hyperventilation, patient started breathing heavily, unable to speak initially ans then stuttering. Concomitant EEG before, during and after the event showed normal posterior dominant rhythm, d  Physiologic photic driving was seen during photic stimulation.    IMPRESSION: This study is within normal limits. No seizures or epileptiform discharges were seen throughout the recording.  One event was recorded as describe above without concomitant eeg change and was NOT an epileptic event.  Magdalene Tardiff Barbra Sarks

## 2022-10-04 NOTE — Progress Notes (Signed)
EEG complete - results pending 

## 2022-10-04 NOTE — Care Plan (Signed)
This 51 years old female with PMH significant for SVT, anxiety, depression, obesity presented in the ED with generalized whole body shaking episode associated with inability to speak.  She states she has these episodes since 2020 s/p COVID infection but she did not seek medical evaluation at the time as she did not have insurance.  She states however over the last few weeks these episodes become more frequent and prolonged.  She was able to communicate however with writing and that episode lasted 15 minutes.  Neurology was consulted recommended MRI and EEG to rule out seizures.  Patient is admitted for further evaluation.  Patient was seen and examined at bedside.  Daughter has a lot of questions,  all answered.

## 2022-10-04 NOTE — ED Notes (Signed)
Pt ambulated to restroom standby assist. Pt stated "I feel some weakness on my left side".

## 2022-10-04 NOTE — Consult Note (Signed)
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 However, she also has some focal findings on exam (though difficult to tell if effort dependent esp on video). Recommend seizure workup with MRI and EEG and if unremarkable then would consider this functional neurologic disorder. Discussed with ED Dr. Dina Rich by phone and pt's family on video at bedside.   Recommendations: Our recommendations are outlined below.  Diagnostic Studies : MRI brain w/wo contrast Please order Routine EEG Please order  Nursing Recommendations : Maintain Euglycemia and EuthermiaOnce stable neuro checks q 4hrs  DVT Prophylaxis : Choice of Primary Team  Disposition : Neurology will follow   ----------------------------------------------------------------------------------------------------    Metrics: [facility] Notification Time: 10/03/2022 23:34:23 Stamp Time: 10/03/2022 23:38:43 Callback Response Time: 10/03/2022 23:39:13  Primary Provider Notified of Diagnostic Impression and Management Plan on: 10/04/2022 01:29:45   CT HEAD: As Per Radiologist CT Head Showed No Acute Hemorrhage or Acute Core  Infarct Reviewed    ----------------------------------------------------------------------------------------------------  Chief Complaint: seizure-like activity and inability to speak  History of Present Illness: Patient is a 51 year old Female. 51 yo RH F with h/o anxiety, arthritis, complex regional pain syndrome, depression, SVT, presenting with seizure-like activity and inability to speak.  In 2020 she had a respiratory illness and after that, every couple of months would have periods of brain fog (she would forget where she was going while driving), rigidity throughout the body, and muscle weakness.  This eventually (this year per family although she had an EEG in 2021 per chart) progressed to shaking activity as well. The semiology is that she will start shaking throughout the body, with retained alertness but she can't control her body movements, her head goes to the left side, the L corner of the mouth is pulled back, and afterward she is unable to speak. The shaking will last for a couple of minutes. She will be unable to talk afterward for a couple of hours and up to 12 hours. Episodes are triggered by startle. Sometimes it feels like her brain tingles beforehand.  She had an MRI scan of the brain in 2020 for workup for possible MS for some of these symptoms - MRI was normal. She also had an EEG in 2021 in the chart which was normal. She didn't get much workup and didn't see a neurologist due to lack of insurance. Last month she established a PCP and has a referral to see neurology.  Today she came to the ED for more intense than usual episode with 10 minutes of shaking. For 2 days she just hasn't felt right. On exam she is mute but comprehension is fully intact -she indicates answers by nodding/shaking her head, or holding up her fingers for numbers or pointing, or typing on a phone. She has R leg weakness and endorses difference in  sensation on the R hemibody. She feels like when her  jaw relaxes she is then able to talk again - she is able to open her mouth and move her lips to smile on exam.  Family history of childhood seizures in her daughter, and seizure disorder in her aunt. 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 No personal history of TBI, brain surgery, meningitis, encephalitis.    Past Medical History: Other PMH:  as per HPI  Medications:  No Anticoagulant use  No Antiplatelet use Reviewed EMR for current medications  Allergies:  Reviewed  Social History: Smoking: Former  Family History:  There is no family history of premature cerebrovascular disease pertinent to this consultation  ROS : 14 Points Review of Systems was performed and was negative except mentioned in HPI.  Past Surgical History: There Is No Surgical History Contributory To Today's Visit    Examination: BP(139/88), Pulse(78), 1A: Level of Consciousness - Alert; keenly responsive + 0 1B: Ask Month and Age - 1 Question Right + 1 1C: Blink Eyes & Squeeze Hands - Performs Both Tasks + 0 2: Test Horizontal Extraocular Movements - Normal + 0 3: Test Visual Fields - No Visual Loss + 0 4: Test Facial Palsy (Use Grimace if Obtunded) - Normal symmetry + 0 5A: Test Left Arm Motor Drift - No Drift for 10 Seconds + 0 5B: Test Right Arm Motor Drift - No Drift for 10 Seconds + 0 6A: Test Left Leg Motor Drift - No Drift for 5 Seconds + 0 6B: Test Right Leg Motor Drift - Drift, but doesn't hit bed + 1 7: Test Limb Ataxia (FNF/Heel-Shin) - No Ataxia + 0 8: Test Sensation - Mild-Moderate Loss: Less Sharp/More Dull + 1 9: Test Language/Aphasia - Mute/Global Aphasia: No Usable Speech/Auditory Comprehension + 3 10: Test Dysarthria - Mute/Anarthric + 2 11: Test Extinction/Inattention - No abnormality + 0  NIHSS Score: 8 NIHSS Free Text : Pt is mute but comprehension is intact and she is able to nod, use fingers to hold up numbers, type on phone Said month is  November Sensation different R hemibody  Spoke with : Dr. Wilkie Aye    Patient / Family was informed the Neurology Consult would occur via TeleHealth consult by way of interactive audio and video telecommunications and consented to receiving care in this manner.  Patient is being evaluated for possible acute neurologic impairment and high probability of imminent or life - threatening deterioration.I spent total of 35 minutes providing care to this patient, including time for face to face visit via telemedicine, review of medical records, imaging studies and discussion of findings with providers, the patient and / or family.   Dr Coralie Carpen   [facility] For Inpatient follow-up with [facility] physician please call [facility] [phone]. This is not an outpatient service. Post hospital discharge, please contact hospital directly.

## 2022-10-04 NOTE — ED Notes (Signed)
Patient was examined by Neurologist Rockney Ghee. Per Neurologist Rockney Ghee , pt recommended to have an MRI .

## 2022-10-04 NOTE — Progress Notes (Signed)
Pt did not want to receive MRI Contrast. Pt was informed about [facility] however she stated she did not want the contrast due to her previous severe reaction to CT contrast.

## 2022-10-05 DIAGNOSIS — F445 Conversion disorder with seizures or convulsions: Secondary | ICD-10-CM

## 2022-10-05 DIAGNOSIS — R404 Transient alteration of awareness: Secondary | ICD-10-CM | POA: Diagnosis not present

## 2022-10-05 LAB — HIV ANTIBODY (ROUTINE TESTING W REFLEX): HIV Screen 4th Generation wRfx: NONREACTIVE

## 2022-10-05 NOTE — Progress Notes (Signed)
   10/05/22 0753  Vitals  Temp 97.8 F (36.6 C)  Temp Source Oral  BP 138/64  MAP (mmHg) 85  BP Location Left Arm  BP Method Automatic  Patient Position (if appropriate) Lying  Pulse Rate 65  Pulse Rate Source Monitor  ECG Heart Rate 70  Resp 16  MEWS COLOR  MEWS Score Color Green  Oxygen Therapy  SpO2 97 %  O2 Device Room Air  MEWS Score  MEWS Temp 0  MEWS Systolic 0  MEWS Pulse 0  MEWS RR 0  MEWS LOC 0  MEWS Score 0   Stuttering, slurred, & delayed speech, VSS, PERRLA, smile, hand grips equal, Dr. Dwyane Dee notified immediately

## 2022-10-05 NOTE — Progress Notes (Signed)
Discharge instructions provided. Pt & daughter present, both verbalized understanding of information. The state having no other questions. Pt was discharged via w/c asymptomatic.

## 2022-10-05 NOTE — Plan of Care (Signed)
  Problem: Education: Goal: Expressions of having a comfortable level of knowledge regarding the disease process will increase Outcome: Adequate for Discharge   Problem: Coping: Goal: Ability to adjust to condition or change in health will improve Outcome: Adequate for Discharge Goal: Ability to identify appropriate support needs will improve Outcome: Adequate for Discharge   Problem: Health Behavior/Discharge Planning: Goal: Compliance with prescribed medication regimen will improve Outcome: Adequate for Discharge   Problem: Medication: Goal: Risk for medication side effects will decrease Outcome: Adequate for Discharge   Problem: Clinical Measurements: Goal: Complications related to the disease process, condition or treatment will be avoided or minimized Outcome: Adequate for Discharge Goal: Diagnostic test results will improve Outcome: Adequate for Discharge   Problem: Safety: Goal: Verbalization of understanding the information provided will improve Outcome: Adequate for Discharge   Problem: Self-Concept: Goal: Level of anxiety will decrease Outcome: Adequate for Discharge Goal: Ability to verbalize feelings about condition will improve Outcome: Adequate for Discharge   Problem: Education: Goal: Knowledge of General Education information will improve Description: Including pain rating scale, medication(s)/side effects and non-pharmacologic comfort measures Outcome: Adequate for Discharge   Problem: Health Behavior/Discharge Planning: Goal: Ability to manage health-related needs will improve Outcome: Adequate for Discharge   Problem: Clinical Measurements: Goal: Ability to maintain clinical measurements within normal limits will improve Outcome: Adequate for Discharge Goal: Will remain free from infection Outcome: Adequate for Discharge Goal: Diagnostic test results will improve Outcome: Adequate for Discharge Goal: Respiratory complications will improve Outcome:  Adequate for Discharge Goal: Cardiovascular complication will be avoided Outcome: Adequate for Discharge   Problem: Activity: Goal: Risk for activity intolerance will decrease Outcome: Adequate for Discharge   Problem: Nutrition: Goal: Adequate nutrition will be maintained Outcome: Adequate for Discharge   Problem: Coping: Goal: Level of anxiety will decrease Outcome: Adequate for Discharge   Problem: Elimination: Goal: Will not experience complications related to bowel motility Outcome: Adequate for Discharge Goal: Will not experience complications related to urinary retention Outcome: Adequate for Discharge   Problem: Pain Managment: Goal: General experience of comfort will improve Outcome: Adequate for Discharge   Problem: Safety: Goal: Ability to remain free from injury will improve Outcome: Adequate for Discharge   Problem: Skin Integrity: Goal: Risk for impaired skin integrity will decrease Outcome: Adequate for Discharge   

## 2022-10-05 NOTE — Discharge Instructions (Signed)
Advised to follow-up with primary care physician in 1 week. Advised to follow-up with neurology as scheduled.

## 2022-10-05 NOTE — Progress Notes (Addendum)
PROGRESS NOTE    Tiffany [name]  [id number] DOB: 10-Jun-1971 DOA: 10/03/2022 PCP: System, Provider Not In    Brief Narrative:  This 51 years old female with PMH significant for SVT, anxiety, depression, obesity presented in the ED with generalized whole body shaking episode associated with inability to speak.  She states she has these episodes since 2020 s/p COVID infection but she did not seek medical evaluation at that time as she did not have insurance.  She states however over the last few weeks these episodes become more frequent and prolonged.  She was able to communicate however with writing and that episode lasted 15 minutes.  Neurology was consulted recommended MRI and EEG to rule out seizures.  Patient is admitted for further evaluation.  MRI no abnormality found.  EEG shows no evidence of seizures.  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 MRI: No intracranial abnormality or epileptogenic foci noted. EEG: No evidence of seizures noted, event was recorded during hyperventilation phase.  It was considered not an epileptic event. 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 Patient was offered psych evaluation patient declined. She stated that she is following with psychiatrist outpatient. Formal neurological consult pending.  Anxiety disorder  Continue alprazolam  Depression-: Continue Cymbalta  SVT: Continue Cardizem:  Morbid obesity Estimated body mass index is 42.37 kg/m as calculated from the following:   Height as of this encounter: 5' 3.5" (1.613 m).   Weight as of this encounter: 110.2 kg.  Diet and exercise discussed in  detail.  DVT prophylaxis: Lovenox Code Status: Full code Family Communication: No family at bed side Disposition Plan:   Status is: Observation The patient remains OBS appropriate and will d/c before 2 midnights.   Patient admitted for myoclonus associated with motor aphasia without LOC.  Formal neurology consult is pending.  Anticipated discharge home in 1 to 2 days.  Consultants:  Neurology  Procedures: EEG, MRI Antimicrobials: None  Subjective: Patient was seen and examined at bedside.  Overnight events noted. Patient continued to have those episodes where she is unable to talk clearly , sttutering  Objective: Vitals:   10/05/22 0033 10/05/22 0522 10/05/22 0753 10/05/22 1348  BP: (!) 150/64 124/60 138/64 123/71  Pulse: 67 72 65 74  Resp: 18 16 16 20   Temp: 98.1 F (36.7 C) 98.1 F (36.7 C) 97.8 F (36.6 C) 98.2 F (36.8 C)  TempSrc: Oral Oral Oral Oral  SpO2: 96% 97% 97% 97%  Weight:      Height:       No intake or output data in the 24 hours ending 10/05/22 1417 Filed Weights   10/03/22 2147  Weight: 110.2 kg    Examination:  General exam: Appears comfortable, not in any acute distress. Respiratory system: CTA bilaterally, respiratory effort normal, RR 15 Cardiovascular system: S1 & S2 heard, regular rate and rhythm, no murmur. Gastrointestinal system: Abdomen is soft, non tender, non distended, BS+ Central nervous system: Alert and oriented x 3. No focal neurological deficits. Extremities: No edema, no cyanosis, no clubbing. Skin: No rashes, lesions or ulcers Psychiatry: Judgement and insight appear normal. Mood & affect appropriate.     Data Reviewed: I have personally reviewed following labs and imaging studies  CBC: Recent Labs  Lab 10/03/22 2216 11/01/22 0556  WBC 9.9 7.6  NEUTROABS 6.5  --   HGB 15.2* 14.3  HCT 45.2 42.8  MCV 92.1 92.2  PLT 256 332   Basic Metabolic Panel: Recent Labs  Lab 10/03/22 2216 11-01-22 0556  NA 141  --    K 3.5  --   CL 110  --   CO2 23  --   GLUCOSE 110*  --   BUN 12  --   CREATININE 0.82 0.83  CALCIUM 9.3  --    GFR: Estimated Creatinine Clearance: 96.5 mL/min (by C-G formula based on SCr of 0.83 mg/dL). Liver Function Tests: Recent Labs  Lab 10/03/22 2216  AST 19  ALT 18  ALKPHOS 195*  BILITOT 0.8  PROT 8.0  ALBUMIN 4.3   No results for input(s): "LIPASE", "AMYLASE" in the last 168 hours. No results for input(s): "AMMONIA" in the last 168 hours. Coagulation Profile: No results for input(s): "INR", "PROTIME" in the last 168 hours. Cardiac Enzymes: Recent Labs  Lab 2022-11-01 0556  CKTOTAL 89   BNP (last 3 results) No results for input(s): "PROBNP" in the last 8760 hours. HbA1C: No results for input(s): "HGBA1C" in the last 72 hours. CBG: Recent Labs  Lab 10/03/22 2345 11-01-2022 1429  GLUCAP 103* 139*   Lipid Profile: No results for input(s): "CHOL", "HDL", "LDLCALC", "TRIG", "CHOLHDL", "LDLDIRECT" in the last 72 hours. Thyroid Function Tests: No results for input(s): "TSH", "T4TOTAL", "FREET4", "T3FREE", "THYROIDAB" in the last 72 hours. Anemia Panel: No results for input(s): "VITAMINB12", "FOLATE", "FERRITIN", "TIBC", "IRON", "RETICCTPCT" in the last 72 hours. Sepsis Labs: No results for input(s): "PROCALCITON", "LATICACIDVEN" in the last 168 hours.  Recent Results (from the past 240 hour(s))  Respiratory (~20 pathogens) panel by PCR     Status: None   Collection Time: November 01, 2022  8:20 AM   Specimen: Nasopharyngeal Swab; Respiratory  Result Value Ref Range Status   Adenovirus NOT DETECTED NOT DETECTED Final   Coronavirus 229E NOT DETECTED NOT DETECTED Final    Comment: (NOTE) The Coronavirus on the Respiratory Panel, DOES NOT test for the novel  Coronavirus (2019 nCoV)    Coronavirus HKU1 NOT DETECTED NOT DETECTED Final   Coronavirus NL63 NOT DETECTED NOT DETECTED Final   Coronavirus OC43 NOT DETECTED NOT DETECTED Final   Metapneumovirus NOT DETECTED  NOT DETECTED Final   Rhinovirus / Enterovirus NOT DETECTED NOT DETECTED Final   Influenza A NOT DETECTED NOT DETECTED Final   Influenza B NOT DETECTED NOT DETECTED Final   Parainfluenza Virus 1 NOT DETECTED NOT DETECTED Final   Parainfluenza Virus 2 NOT DETECTED NOT DETECTED Final   Parainfluenza Virus 3 NOT DETECTED NOT DETECTED Final   Parainfluenza Virus 4 NOT DETECTED NOT DETECTED Final   Respiratory Syncytial Virus NOT DETECTED NOT DETECTED Final   Bordetella pertussis NOT DETECTED NOT DETECTED Final   Bordetella Parapertussis NOT DETECTED NOT DETECTED Final   Chlamydophila pneumoniae NOT DETECTED NOT DETECTED Final   Mycoplasma pneumoniae NOT DETECTED NOT DETECTED Final    Comment: Performed at [facility], [address] [place], [place] [address]    Radiology Studies: EEG adult  Result Date: 11-01-22 Lora Havens, MD     11-01-22  5:49 PM Patient Name: Tiffany [name] MRN: [id number] Epilepsy Attending: Lora Havens Referring Physician/Provider: Clance Boll, MD Date: 2022/11/01 Duration: 36.49 mins Patient history: 51 yo RH F with h/o anxiety, arthritis, complex regional pain syndrome, depression,  SVT, presenting with seizure-like activity (described as whole body shaking all at once without evolution and with retained awareness) and inability to speak (mute though intact comprehension and able to express herself through typing and motions). EEG to evaluate for seizure Level of alertness: Awake AEDs during EEG study: None Technical aspects: This EEG study was done with scalp electrodes positioned according to the 10-20 International system of electrode placement. Electrical activity was reviewed with band pass filter of 1-70Hz , sensitivity of 7 uV/mm, display speed of 77mm/sec with a 60Hz  notched filter applied as appropriate. EEG data were recorded continuously and digitally stored.  Video monitoring was available and reviewed as appropriate. Description: The  posterior dominant rhythm consists of 9-10 Hz activity of moderate voltage (25-35 uV) seen predominantly in posterior head regions, symmetric and reactive to eye opening and eye closing. Event button was pressed on 10/04/2022 at 1424. During hyperventilation, patient started breathing heavily, unable to speak initially ans then stuttering. Concomitant EEG before, during and after the event showed normal posterior dominant rhythm, d Physiologic photic driving was seen during photic stimulation.  IMPRESSION: This study is within normal limits. No seizures or epileptiform discharges were seen throughout the recording. One event was recorded as describe above without concomitant eeg change and was NOT an epileptic event. Lora Havens   MR BRAIN WO CONTRAST  Result Date: 10/04/2022 CLINICAL DATA:  Whole-body shaking episode with inability to speak. EXAM: MRI HEAD WITHOUT CONTRAST TECHNIQUE: Multiplanar, multiecho pulse sequences of the brain and surrounding structures were obtained without intravenous contrast. COMPARISON:  CT head 1 day prior. FINDINGS: Brain: There is no acute intracranial hemorrhage, extra-axial fluid collection, or acute infarct. Parenchymal volume is normal. The ventricles are normal in size. Gray-white differentiation is preserved. There are scattered tiny foci of FLAIR signal abnormality in the supratentorial white matter which are nonspecific there is no suspicious parenchymal signal abnormality. There is no structural or migration abnormality. The pituitary, suprasellar region, and other midline structures are normal. The hippocampi are normal in signal and architecture. There is no mass lesion.  There is no mass effect or midline shift. Vascular: Normal flow voids. Skull and upper cervical spine: Normal marrow signal. Sinuses/Orbits: There is mild mucosal thickening in the paranasal sinuses. Globes and orbits are unremarkable. Other: None. IMPRESSION: No acute intracranial pathology or  epileptogenic focus identified. Electronically Signed   By: Valetta Mole M.D.   On: 10/04/2022 08:20   CT Head Wo Contrast  Result Date: 10/04/2022 CLINICAL DATA:  Seizure EXAM: CT HEAD WITHOUT CONTRAST TECHNIQUE: Contiguous axial images were obtained from the base of the skull through the vertex without intravenous contrast. RADIATION DOSE REDUCTION: This exam was performed according to the departmental dose-optimization program which includes automated exposure control, adjustment of the mA and/or kV according to patient size and/or use of iterative reconstruction technique. COMPARISON:  11/22/2020 FINDINGS: Brain: No evidence of acute infarction, hemorrhage, hydrocephalus, extra-axial collection or mass lesion/mass effect. Vascular: No hyperdense vessel or unexpected calcification. Skull: Normal. Negative for fracture or focal lesion. Sinuses/Orbits: The visualized paranasal sinuses are essentially clear. The mastoid air cells are unopacified. Other: None. IMPRESSION: Normal head CT. Electronically Signed   By: Julian Hy M.D.   On: 10/04/2022 00:00    Scheduled Meds:  diltiazem  120 mg Oral Daily   DULoxetine  60 mg Oral Daily   heparin  5,000 Units Subcutaneous Q8H   Continuous Infusions:   LOS: 1 day    Time spent: 50 mins  Shawna Clamp, MD [facility]   If 7PM-7AM, please contact night-coverage

## 2022-10-05 NOTE — Progress Notes (Addendum)
Neurology Progress Note  Patient ID: Tiffany [name] is a 51 y.o. with PMHx of  has a past medical history of Allergy, Anxiety, Arthritis, CRPS (complex regional pain syndrome type I), Depression, DES exposure in utero, and SVT (supraventricular tachycardia).   Subjective: Continues to have some slurred speech and intermittent right-sided symptoms EEG yesterday captured one of her recent typical spells  Patient provides additional history that she did actually discovered the diagnosis of functional neurological disorders herself recently and felt that it did fit her symptoms quite well but subsequently she had a severe episode that scared her more than any other prior episode leading to her presentation to the hospital.  She notes she is also concerned about the potential of multiple sclerosis but has not been unable to get fully worked up to due to insurance issues etc. which have recently been resolved  Exam: Current vital signs: BP 123/71 (BP Location: Right Arm)   Pulse 74   Temp 98.2 F (36.8 C) (Oral)   Resp 20   Ht 5' 3.5" (1.613 m)   Wt 110.2 kg   SpO2 97%   BMI 42.37 kg/m  Vital signs in last 24 hours: Temp:  [97.8 F (36.6 C)-98.6 F (37 C)] 98.2 F (36.8 C) (10/12 1348) Pulse Rate:  [63-74] 74 (10/12 1348) Resp:  [16-20] 20 (10/12 1348) BP: (123-150)/(60-80) 123/71 (10/12 1348) SpO2:  [96 %-98 %] 97 % (10/12 1348)   Gen: In bed, comfortable  Resp: non-labored breathing, no grossly audible wheezing Cardiac: Perfusing extremities well  Abd: soft, nt  Neuro: MS: Awake, alert, conversant, appropriate, following simple commands CN: Face symmetric, tracking examiner in all directions, pupils equal and round, tongue midline Motor: Ambulates from the bathroom to the bed with a steady gait, on casual observation using bilateral upper extremities equally  Pertinent Labs:  Basic Metabolic Panel: Recent Labs  Lab 10/03/22 2216 10/04/22 0556  NA 141  --   K 3.5  --   CL  110  --   CO2 23  --   GLUCOSE 110*  --   BUN 12  --   CREATININE 0.82 0.83  CALCIUM 9.3  --     CBC: Recent Labs  Lab 10/03/22 2216 10/04/22 0556  WBC 9.9 7.6  NEUTROABS 6.5  --   HGB 15.2* 14.3  HCT 45.2 42.8  MCV 92.1 92.2  PLT 256 218   EEG which captured an event was normal, without epileptiform activity  MRI brain was personally reviewed with patient and we discussed her small white matter lesions were within expected parameters of white matter changes for someone of her age, and nonspecific  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  She was appreciative of the diagnosis, noting that in her own investigation she had suspected this might be her condition, and she is motivated to continue counseling and seek psychiatric treatment.  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

## 2022-10-05 NOTE — Progress Notes (Signed)
  Transition of Care (TOC) Screening Note   Patient Details  Name: Tiffany [name] Date of Birth: [birth year]   Transition of Care [facility]) CM/SW Contact:    Roseanne Kaufman, RN Phone Number: 10/05/2022, 3:52 PM    Transition of Care Department [facility]) has reviewed patient and no TOC needs have been identified at this time. We will continue to monitor patient advancement through interdisciplinary progression rounds. If new patient transition needs arise, please place a TOC consult.

## 2022-10-05 NOTE — Discharge Summary (Signed)
Physician Discharge Summary  Tiffany [name] ZOX:[id number] DOB: 10/05/1971 DOA: 10/03/2022  PCP: System, Provider Not In  Admit date: 10/03/2022  Discharge date: 10/05/2022  Admitted From: Home.  Disposition:  Home  Recommendations for Outpatient Follow-up:  Follow up with PCP in 1-2 weeks. Please obtain BMP/CBC in one week.. Advised to follow-up with Neurology as scheduled.  Home Health: None Equipment/Devices: None  Discharge Condition: Stable CODE STATUS:Full code Diet recommendation: Heart Healthy  [facility]: This [age] years old female with PMH significant for SVT, anxiety, depression, obesity presented in the ED with generalized whole body shaking episode associated with inability to speak.  She states she has these episodes since 2020 s/p COVID infection but she did not seek medical evaluation at that time as she did not have insurance.  She states however over the last few weeks these episodes become more frequent and prolonged.  She was able to communicate however with writing and that episode lasted 15 minutes.  Neurology was consulted recommended MRI and EEG to rule out seizures.  Patient was admitted for further evaluation.  MRI showed no abnormality .  EEG shows no evidence of seizures.  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  Neurologist has evaluated the patient, all the questions were answered.  Patient wants to be discharged and follow-up outpatient with neurology.  Patient is being discharged home.   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 MRI: No intracranial abnormality or epileptogenic foci noted. EEG: No evidence of seizures noted, event was recorded during hyperventilation  phase.  It was considered not an epileptic event. 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 Patient was offered psych evaluation patient declined. She stated that she is following with psychiatrist outpatient. Patient does have functional neurological disorder as diagnosed by neurology. Patient feels better wants to be discharged.  Patient will have neurology outpatient follow-up.   Anxiety disorder  Continue alprazolam   Depression-: Continue Cymbalta   SVT: Continue Cardizem:   Morbid obesity Estimated body mass index is 42.37 kg/m as calculated from the following:   Height as of this encounter: 5' 3.5" (1.613 m).   Weight as of this encounter: 110.2 kg.  Diet and exercise discussed in detail.    Discharge Instructions  Discharge Instructions     Call MD for:  persistant dizziness or light-headedness   Complete by: As directed    Call MD for:  persistant nausea and vomiting   Complete by: As directed    Diet - low sodium heart healthy   Complete by: As directed    Diet Carb Modified   Complete by: As directed    Discharge instructions   Complete by: As directed    Advised to follow-up with primary care physician in 1 week. Advised to follow-up with neurology as scheduled.   Increase activity slowly   Complete by: As directed       Allergies as of 10/05/2022       Reactions   Contrast Media [iodinated Contrast Media] Shortness Of Breath   Stadol [butorphanol] Shortness Of Breath   Other    Beta blockers exacerbate asthma        Medication List     STOP taking these medications    doxycycline 100 MG capsule Commonly known as:  VIBRAMYCIN   methocarbamol 750 MG tablet Commonly known as: Robaxin-750   ondansetron 4 MG disintegrating tablet Commonly known as: ZOFRAN-ODT   ondansetron 4 MG tablet Commonly known as: ZOFRAN   oxyCODONE-acetaminophen 5-325 MG tablet Commonly known as: PERCOCET/ROXICET   predniSONE 20 MG  tablet Commonly known as: DELTASONE       TAKE these medications    albuterol (2.5 MG/3ML) 0.083% nebulizer solution Commonly known as: PROVENTIL Take 3 mLs (2.5 mg total) by nebulization every 6 (six) hours as needed for wheezing or shortness of breath.   ALPRAZolam 0.5 MG tablet Commonly known as: XANAX Take 1 tablet (0.5 mg total) by mouth daily as needed for anxiety. What changed:  when to take this additional instructions   diltiazem 120 MG 24 hr tablet Commonly known as: CARDIZEM LA Take 120 mg by mouth daily.   DULoxetine 60 MG capsule Commonly known as: CYMBALTA Take 1 capsule (60 mg total) by mouth daily.   ibuprofen 200 MG tablet Commonly known as: ADVIL Take 3 tablets (600 mg total) by mouth 4 (four) times daily.        Follow-up Information     [facility] Follow up in 1 week(s).   Contact information: [address] [place] [address] [phone]                Allergies  Allergen Reactions   Contrast Media [Iodinated Contrast Media] Shortness Of Breath   Stadol [Butorphanol] Shortness Of Breath   Other     Beta blockers exacerbate asthma     Consultations: Neurology   Procedures/Studies: EEG adult  Result Date: October 25, 2022 Tiffany Quest, MD     10/25/22  5:49 PM Patient Name: Tiffany [name] MRN: [id number] Epilepsy Attending: Charlsie [name] Referring Physician/Provider: Lurline Del, MD Date: 10-25-2022 Duration: 36.49 mins Patient history: 51 yo RH F with h/o anxiety, arthritis, complex regional pain syndrome, depression, SVT, presenting with seizure-like activity (described as whole body shaking all at once without evolution and with retained awareness) and inability to speak (mute though intact comprehension and able to express herself through typing and motions). EEG to evaluate for seizure Level of alertness: Awake AEDs during EEG study: None Technical aspects: This EEG study was done  with scalp electrodes positioned according to the 10-20 International system of electrode placement. Electrical activity was reviewed with band pass filter of 1-70Hz , sensitivity of 7 uV/mm, display speed of 12mm/sec with a 60Hz  notched filter applied as appropriate. EEG data were recorded continuously and digitally stored.  Video monitoring was available and reviewed as appropriate. Description: The posterior dominant rhythm consists of 9-10 Hz activity of moderate voltage (25-35 uV) seen predominantly in posterior head regions, symmetric and reactive to eye opening and eye closing. Event button was pressed on 2022-10-25 at 1424. During hyperventilation, patient started breathing heavily, unable to speak initially ans then stuttering. Concomitant EEG before, during and after the event showed normal posterior dominant rhythm, d Physiologic photic driving was seen during photic stimulation.  IMPRESSION: This study is within normal limits. No seizures or epileptiform discharges were seen throughout the recording. One event was recorded as describe above without concomitant eeg change and was NOT an epileptic event. 12/04/2022   MR BRAIN WO CONTRAST  Result Date: 10/25/2022 CLINICAL DATA:  Whole-body shaking episode with inability to speak. EXAM: MRI HEAD WITHOUT CONTRAST TECHNIQUE: Multiplanar, multiecho pulse sequences of the brain and surrounding structures were obtained without intravenous contrast.  COMPARISON:  CT head 1 day prior. FINDINGS: Brain: There is no acute intracranial hemorrhage, extra-axial fluid collection, or acute infarct. Parenchymal volume is normal. The ventricles are normal in size. Gray-white differentiation is preserved. There are scattered tiny foci of FLAIR signal abnormality in the supratentorial white matter which are nonspecific there is no suspicious parenchymal signal abnormality. There is no structural or migration abnormality. The pituitary, suprasellar region, and other  midline structures are normal. The hippocampi are normal in signal and architecture. There is no mass lesion.  There is no mass effect or midline shift. Vascular: Normal flow voids. Skull and upper cervical spine: Normal marrow signal. Sinuses/Orbits: There is mild mucosal thickening in the paranasal sinuses. Globes and orbits are unremarkable. Other: None. IMPRESSION: No acute intracranial pathology or epileptogenic focus identified. Electronically Signed   By: Lesia Hausen M.D.   On: 10/04/2022 08:20   CT Head Wo Contrast  Result Date: 10/04/2022 CLINICAL DATA:  Seizure EXAM: CT HEAD WITHOUT CONTRAST TECHNIQUE: Contiguous axial images were obtained from the base of the skull through the vertex without intravenous contrast. RADIATION DOSE REDUCTION: This exam was performed according to the departmental dose-optimization program which includes automated exposure control, adjustment of the mA and/or kV according to patient size and/or use of iterative reconstruction technique. COMPARISON:  11/22/2020 FINDINGS: Brain: No evidence of acute infarction, hemorrhage, hydrocephalus, extra-axial collection or mass lesion/mass effect. Vascular: No hyperdense vessel or unexpected calcification. Skull: Normal. Negative for fracture or focal lesion. Sinuses/Orbits: The visualized paranasal sinuses are essentially clear. The mastoid air cells are unopacified. Other: None. IMPRESSION: Normal head CT. Electronically Signed   By: Charline Bills M.D.   On: 10/04/2022 00:00   (Echo, Carotid, EGD, Colonoscopy, ERCP)    Subjective: Patient was seen and examined at bedside.  Overnight events noted.   Patient reports feeling better and she wants to be discharged.  Discharge Exam: Vitals:   10/05/22 0753 10/05/22 1348  BP: 138/64 123/71  Pulse: 65 74  Resp: 16 20  Temp: 97.8 F (36.6 C) 98.2 F (36.8 C)  SpO2: 97% 97%   Vitals:   10/05/22 0033 10/05/22 0522 10/05/22 0753 10/05/22 1348  BP: (!) 150/64 124/60  138/64 123/71  Pulse: 67 72 65 74  Resp: 18 16 16 20   Temp: 98.1 F (36.7 C) 98.1 F (36.7 C) 97.8 F (36.6 C) 98.2 F (36.8 C)  TempSrc: Oral Oral Oral Oral  SpO2: 96% 97% 97% 97%  Weight:      Height:        General: Pt is alert, awake, not in acute distress Cardiovascular: RRR, S1/S2 +, no rubs, no gallops Respiratory: CTA bilaterally, no wheezing, no rhonchi Abdominal: Soft, NT, ND, bowel sounds + Extremities: no edema, no cyanosis    The results of significant diagnostics from this hospitalization (including imaging, microbiology, ancillary and laboratory) are listed below for reference.     Microbiology: Recent Results (from the past 240 hour(s))  Respiratory (~20 pathogens) panel by PCR     Status: None   Collection Time: 10/04/22  8:20 AM   Specimen: Nasopharyngeal Swab; Respiratory  Result Value Ref Range Status   Adenovirus NOT DETECTED NOT DETECTED Final   Coronavirus 229E NOT DETECTED NOT DETECTED Final    Comment: (NOTE) The Coronavirus on the Respiratory Panel, DOES NOT test for the novel  Coronavirus (2019 nCoV)    Coronavirus HKU1 NOT DETECTED NOT DETECTED Final   Coronavirus NL63 NOT DETECTED NOT DETECTED Final   Coronavirus  OC43 NOT DETECTED NOT DETECTED Final   Metapneumovirus NOT DETECTED NOT DETECTED Final   Rhinovirus / Enterovirus NOT DETECTED NOT DETECTED Final   Influenza A NOT DETECTED NOT DETECTED Final   Influenza B NOT DETECTED NOT DETECTED Final   Parainfluenza Virus 1 NOT DETECTED NOT DETECTED Final   Parainfluenza Virus 2 NOT DETECTED NOT DETECTED Final   Parainfluenza Virus 3 NOT DETECTED NOT DETECTED Final   Parainfluenza Virus 4 NOT DETECTED NOT DETECTED Final   Respiratory Syncytial Virus NOT DETECTED NOT DETECTED Final   Bordetella pertussis NOT DETECTED NOT DETECTED Final   Bordetella Parapertussis NOT DETECTED NOT DETECTED Final   Chlamydophila pneumoniae NOT DETECTED NOT DETECTED Final   Mycoplasma pneumoniae NOT DETECTED NOT  DETECTED Final    Comment: Performed at [facility], [address], [place], [place] [address]     Labs: BNP (last 3 results) No results for input(s): "BNP" in the last 8760 hours. Basic Metabolic Panel: Recent Labs  Lab 10/03/22 2216 10/04/22 0556  NA 141  --   K 3.5  --   CL 110  --   CO2 23  --   GLUCOSE 110*  --   BUN 12  --   CREATININE 0.82 0.83  CALCIUM 9.3  --    Liver Function Tests: Recent Labs  Lab 10/03/22 2216  AST 19  ALT 18  ALKPHOS 195*  BILITOT 0.8  PROT 8.0  ALBUMIN 4.3   No results for input(s): "LIPASE", "AMYLASE" in the last 168 hours. No results for input(s): "AMMONIA" in the last 168 hours. CBC: Recent Labs  Lab 10/03/22 2216 10/04/22 0556  WBC 9.9 7.6  NEUTROABS 6.5  --   HGB 15.2* 14.3  HCT 45.2 42.8  MCV 92.1 92.2  PLT 256 218   Cardiac Enzymes: Recent Labs  Lab 10/04/22 0556  CKTOTAL 89   BNP: Invalid input(s): "POCBNP" CBG: Recent Labs  Lab 10/03/22 2345 10/04/22 1429  GLUCAP 103* 139*   D-Dimer No results for input(s): "DDIMER" in the last 72 hours. Hgb A1c No results for input(s): "HGBA1C" in the last 72 hours. Lipid Profile No results for input(s): "CHOL", "HDL", "LDLCALC", "TRIG", "CHOLHDL", "LDLDIRECT" in the last 72 hours. Thyroid function studies No results for input(s): "TSH", "T4TOTAL", "T3FREE", "THYROIDAB" in the last 72 hours.  Invalid input(s): "FREET3" Anemia work up No results for input(s): "VITAMINB12", "FOLATE", "FERRITIN", "TIBC", "IRON", "RETICCTPCT" in the last 72 hours. Urinalysis    Component Value Date/Time   COLORURINE YELLOW 08/16/2022 1656   APPEARANCEUR HAZY (A) 08/16/2022 1656   LABSPEC 1.019 08/16/2022 1656   PHURINE 5.0 08/16/2022 1656   GLUCOSEU NEGATIVE 08/16/2022 1656   HGBUR SMALL (A) 08/16/2022 1656   BILIRUBINUR NEGATIVE 08/16/2022 1656   BILIRUBINUR neg 11/10/2013 1415   KETONESUR NEGATIVE 08/16/2022 1656   PROTEINUR NEGATIVE 08/16/2022 1656   UROBILINOGEN 0.2  11/10/2013 1415   NITRITE NEGATIVE 08/16/2022 1656   LEUKOCYTESUR MODERATE (A) 08/16/2022 1656   Sepsis Labs Recent Labs  Lab 10/03/22 2216 10/04/22 0556  WBC 9.9 7.6   Microbiology Recent Results (from the past 240 hour(s))  Respiratory (~20 pathogens) panel by PCR     Status: None   Collection Time: 10/04/22  8:20 AM   Specimen: Nasopharyngeal Swab; Respiratory  Result Value Ref Range Status   Adenovirus NOT DETECTED NOT DETECTED Final   Coronavirus 229E NOT DETECTED NOT DETECTED Final    Comment: (NOTE) The Coronavirus on the Respiratory Panel, DOES NOT test  for the novel  Coronavirus (2019 nCoV)    Coronavirus HKU1 NOT DETECTED NOT DETECTED Final   Coronavirus NL63 NOT DETECTED NOT DETECTED Final   Coronavirus OC43 NOT DETECTED NOT DETECTED Final   Metapneumovirus NOT DETECTED NOT DETECTED Final   Rhinovirus / Enterovirus NOT DETECTED NOT DETECTED Final   Influenza A NOT DETECTED NOT DETECTED Final   Influenza B NOT DETECTED NOT DETECTED Final   Parainfluenza Virus 1 NOT DETECTED NOT DETECTED Final   Parainfluenza Virus 2 NOT DETECTED NOT DETECTED Final   Parainfluenza Virus 3 NOT DETECTED NOT DETECTED Final   Parainfluenza Virus 4 NOT DETECTED NOT DETECTED Final   Respiratory Syncytial Virus NOT DETECTED NOT DETECTED Final   Bordetella pertussis NOT DETECTED NOT DETECTED Final   Bordetella Parapertussis NOT DETECTED NOT DETECTED Final   Chlamydophila pneumoniae NOT DETECTED NOT DETECTED Final   Mycoplasma pneumoniae NOT DETECTED NOT DETECTED Final    Comment: Performed at [facility], [place] [address], [place], [place] [address]     Time coordinating discharge: Over 30 minutes  SIGNED:   Shawna Clamp, MD  [facility] 10/05/2022, 5:01 PM Pager   If 7PM-7AM, please contact night-coverage

## 2022-10-05 NOTE — Progress Notes (Signed)
Pt is continuing to have slurred, delayed speech with stuttering. Called rapid response nurse, Donnamarie Rossetti RN  to assist with further assessment.

## 2023-02-09 ENCOUNTER — Emergency Department (HOSPITAL_COMMUNITY)
Admission: EM | Admit: 2023-02-09 | Discharge: 2023-02-09 | Disposition: A | Discharge status: Home / Self Care | Payer: BC Managed Care – PPO | Attending: Emergency Medicine | Admitting: Emergency Medicine

## 2023-02-09 ENCOUNTER — Emergency Department (HOSPITAL_COMMUNITY): Payer: BC Managed Care – PPO

## 2023-02-09 ENCOUNTER — Other Ambulatory Visit: Payer: Self-pay

## 2023-02-09 ENCOUNTER — Encounter (HOSPITAL_COMMUNITY): Payer: Self-pay

## 2023-02-09 DIAGNOSIS — U071 COVID-19: Secondary | ICD-10-CM

## 2023-02-09 DIAGNOSIS — R0602 Shortness of breath: Secondary | ICD-10-CM | POA: Diagnosis present

## 2023-02-09 LAB — BASIC METABOLIC PANEL
Anion gap: 11 (ref 5–15)
BUN: 9 mg/dL (ref 6–20)
CO2: 23 mmol/L (ref 22–32)
Calcium: 8.8 mg/dL — ABNORMAL LOW (ref 8.9–10.3)
Chloride: 103 mmol/L (ref 98–111)
Creatinine, Ser: 1.17 mg/dL — ABNORMAL HIGH (ref 0.44–1.00)
GFR, Estimated: 56 mL/min — ABNORMAL LOW (ref >60)
Glucose, Bld: 159 mg/dL — ABNORMAL HIGH (ref 70–99)
Potassium: 3.8 mmol/L (ref 3.5–5.1)
Sodium: 137 mmol/L (ref 135–145)

## 2023-02-09 LAB — CBC
HCT: 44.4 % (ref 36.0–46.0)
Hemoglobin: 14.4 g/dL (ref 12.0–15.0)
MCH: 30.7 pg (ref 26.0–34.0)
MCHC: 32.4 g/dL (ref 30.0–36.0)
MCV: 94.7 fL (ref 80.0–100.0)
Platelets: 209 10*3/uL (ref 150–400)
RBC: 4.69 MIL/uL (ref 3.87–5.11)
RDW: 12.7 % (ref 11.5–15.5)
WBC: 8.9 10*3/uL (ref 4.0–10.5)
nRBC: 0 % (ref 0.0–0.2)

## 2023-02-09 LAB — RESP PANEL BY RT-PCR (RSV, FLU A&B, COVID)  RVPGX2
Influenza A by PCR: NEGATIVE
Influenza B by PCR: NEGATIVE
Resp Syncytial Virus by PCR: NEGATIVE
SARS Coronavirus 2 by RT PCR: POSITIVE — AB

## 2023-02-09 LAB — HCG, QUANTITATIVE, PREGNANCY: hCG, Beta Chain, Quant, S: 2 m[IU]/mL (ref <5)

## 2023-02-09 LAB — TROPONIN I (HIGH SENSITIVITY)
Troponin I (High Sensitivity): 3 ng/L (ref <18)
Troponin I (High Sensitivity): 3 ng/L (ref <18)

## 2023-02-09 LAB — D-DIMER, QUANTITATIVE: D-Dimer, Quant: 0.73 ug/mL-FEU — ABNORMAL HIGH (ref 0.00–0.50)

## 2023-02-09 MED ORDER — IOHEXOL 350 MG/ML SOLN
75.0000 mL | Freq: Once | INTRAVENOUS | Status: AC | PRN
  Administered 2023-02-09: 75 mL via INTRAVENOUS

## 2023-02-09 MED ORDER — DIPHENHYDRAMINE HCL 50 MG/ML IJ SOLN
50.0000 mg | Freq: Once | INTRAMUSCULAR | Status: AC
  Filled 2023-02-09: qty 1

## 2023-02-09 MED ORDER — SODIUM CHLORIDE 0.9 % IV BOLUS
1000.0000 mL | Freq: Once | INTRAVENOUS | Status: AC
  Administered 2023-02-09: 1000 mL via INTRAVENOUS

## 2023-02-09 MED ORDER — DIPHENHYDRAMINE HCL 25 MG PO CAPS
50.0000 mg | ORAL_CAPSULE | Freq: Once | ORAL | Status: AC
  Administered 2023-02-09: 50 mg via ORAL
  Filled 2023-02-09: qty 2

## 2023-02-09 MED ORDER — OXYCODONE HCL 5 MG PO TABS
5.0000 mg | ORAL_TABLET | Freq: Once | ORAL | Status: AC
  Administered 2023-02-09: 5 mg via ORAL
  Filled 2023-02-09: qty 1

## 2023-02-09 MED ORDER — ACETAMINOPHEN 500 MG PO TABS
1000.0000 mg | ORAL_TABLET | Freq: Once | ORAL | Status: AC
  Administered 2023-02-09: 1000 mg via ORAL
  Filled 2023-02-09: qty 2

## 2023-02-09 MED ORDER — METHYLPREDNISOLONE SODIUM SUCC 40 MG IJ SOLR
40.0000 mg | Freq: Once | INTRAMUSCULAR | Status: AC
  Administered 2023-02-09: 40 mg via INTRAVENOUS
  Filled 2023-02-09: qty 1

## 2023-02-09 NOTE — ED Notes (Signed)
Sort RN Note: Pt observed to be waiting in no apparent distress at this time.

## 2023-02-09 NOTE — ED Provider Notes (Signed)
[facility] Provider Note   CSN: [id number] Arrival date & time: 02/09/23  1100     History  Chief Complaint  Patient presents with   Shortness of Breath    Tiffany [name] is a 52 y.o. female.  The history is provided by the patient and medical records. No language interpreter was used.  Shortness of Breath    52 year old female with significant history of complex regional pain syndrome, anxiety, depression, SVT, presenting complaining of shortness of breath.  Patient reports 3 weeks ago she was diagnosed with pneumonia on chest x-ray and was prescribed Z-Pak for which she took and did report improvement of symptoms.  However for the past few days she endorsed having pleuritic chest pain primary on the right side of her lungs, now having fever chills congestion and cough.  She went to see her primary care doctor today for her complaint, states they repeat a chest x-ray and noted worsening pneumonia.  Recommend patient to come to the ER to be evaluated as well as to rule out PE.  She denies any prior history of PE or DVT she denies any recent surgery, prolonged bedrest, active cancer, taking oral hormone, having leg swelling or calf pain.  She is a former smoker but denies any history of asthma or COPD.  No recent sick contact.  She did try over-the-counter medication at home without adequate relief.  Home Medications Prior to Admission medications   Medication Sig Start Date End Date Taking? Authorizing Provider  albuterol (PROVENTIL) (2.5 MG/3ML) 0.083% nebulizer solution Take 3 mLs (2.5 mg total) by nebulization every 6 (six) hours as needed for wheezing or shortness of breath. 04/11/22   Daleen Bo, MD  ALPRAZolam Duanne Moron) 0.5 MG tablet Take 1 tablet (0.5 mg total) by mouth daily as needed for anxiety. Patient taking differently: Take 0.5 mg by mouth See admin instructions. 0.33m every night And 0.560mdaily as needed for panic 10/03/16    English, Stephanie D, PA  diltiazem (CARDIZEM LA) 120 MG 24 hr tablet Take 120 mg by mouth daily. 09/24/22   [provider]  DULoxetine (CYMBALTA) 60 MG capsule Take 1 capsule (60 mg total) by mouth daily. 10/03/16   EnIvar Drape, PA  ibuprofen (ADVIL) 200 MG tablet Take 3 tablets (600 mg total) by mouth 4 (four) times daily. 12/08/21   LoJesusita OkaMD      Allergies    Contrast media [iodinated contrast media], Stadol [butorphanol], and Other    Review of Systems   Review of Systems  Respiratory:  Positive for shortness of breath.   All other systems reviewed and are negative.   Physical Exam Updated Vital Signs BP (!) 167/94 (BP Location: Right Arm)   Pulse (!) 101   Temp 99.3 F (37.4 C)   Resp (!) 24   Ht 5' 1"$  (1.549 m)   Wt 113.4 kg   SpO2 96%   BMI 47.24 kg/m  Physical Exam Vitals and nursing note reviewed.  Constitutional:      Appearance: She is well-developed. She is obese.     Comments: Obese female appears uncomfortable but nontoxic  HENT:     Head: Atraumatic.  Eyes:     Conjunctiva/sclera: Conjunctivae normal.  Cardiovascular:     Rate and Rhythm: Tachycardia present.  Pulmonary:     Effort: Pulmonary effort is normal.     Breath sounds: No decreased breath sounds, wheezing, rhonchi or rales.  Chest:  Chest wall: Tenderness (Tenderness noted to the right anterior lateral chest wall without any rash or overlying skin changes.  No crepitus emphysema.) present.  Musculoskeletal:     Cervical back: Neck supple.     Right lower leg: No edema.     Left lower leg: No edema.  Skin:    Findings: No rash.  Neurological:     Mental Status: She is alert.  Psychiatric:        Mood and Affect: Mood normal.     ED Results / Procedures / Treatments   Labs (all labs ordered are listed, but only abnormal results are displayed) Labs Reviewed  RESP PANEL BY RT-PCR (RSV, FLU A&B, COVID)  RVPGX2 - Abnormal; Notable for the following  components:      Result Value   SARS Coronavirus 2 by RT PCR POSITIVE (*)    All other components within normal limits  BASIC METABOLIC PANEL - Abnormal; Notable for the following components:   Glucose, Bld 159 (*)    Creatinine, Ser 1.17 (*)    Calcium 8.8 (*)    GFR, Estimated 56 (*)    All other components within normal limits  D-DIMER, QUANTITATIVE - Abnormal; Notable for the following components:   D-Dimer, Quant 0.73 (*)    All other components within normal limits  CBC  HCG, QUANTITATIVE, PREGNANCY  TROPONIN I (HIGH SENSITIVITY)  TROPONIN I (HIGH SENSITIVITY)    EKG EKG Interpretation  Date/Time:  Friday February 09 2023 11:07:27 EST Ventricular Rate:  101 PR Interval:  158 QRS Duration: 76 QT Interval:  352 QTC Calculation: 456 R Axis:   17 Text Interpretation: Sinus tachycardia Abnormal QRS-T angle, consider primary T wave abnormality Abnormal ECG When compared with ECG of 03-Oct-2022 22:26, No significant change since last tracing Confirmed by Garnette Gunner [phone]) on 02/09/2023 2:27:28 PM ED ECG REPORT   Date: 02/09/2023  Rate: 101  Rhythm: sinus tachycardia  QRS Axis: normal  Intervals: normal  ST/T Wave abnormalities: nonspecific T wave changes  Conduction Disutrbances:none  Narrative Interpretation:   Old EKG Reviewed: unchanged  I have personally reviewed the EKG tracing and agree with the computerized printout as noted.   Radiology DG Chest 2 View  Result Date: 02/09/2023 CLINICAL DATA:  Shortness of breath. EXAM: CHEST - 2 VIEW COMPARISON:  Chest x-ray April 10, 2022 FINDINGS: The heart size and mediastinal contours are within normal limits. Both lungs are clear. No visible pleural effusions or pneumothorax. No acute osseous abnormality. IMPRESSION: No active cardiopulmonary disease. Electronically Signed   By: Margaretha Sheffield M.D.   On: 02/09/2023 11:38    Procedures Procedures    Medications Ordered in ED Medications  diphenhydrAMINE  (BENADRYL) capsule 50 mg (has no administration in time range)    Or  diphenhydrAMINE (BENADRYL) injection 50 mg (has no administration in time range)  acetaminophen (TYLENOL) tablet 1,000 mg (1,000 mg Oral Given 02/09/23 1426)  methylPREDNISolone sodium succinate (SOLU-MEDROL) 40 mg/mL injection 40 mg (40 mg Intravenous Given 02/09/23 1426)  sodium chloride 0.9 % bolus 1,000 mL (1,000 mLs Intravenous New Bag/Given 02/09/23 1430)    ED Course/ Medical Decision Making/ A&P                             Medical Decision Making Amount and/or Complexity of Data Reviewed Labs: ordered. Radiology: ordered.  Risk OTC drugs. Prescription drug management.   BP (!) 167/94 (BP Location: Right Arm)  Pulse (!) 101   Temp 99.3 F (37.4 C)   Resp (!) 24   Ht 5' 1"$  (1.549 m)   Wt 113.4 kg   SpO2 96%   BMI 47.24 kg/m   62:89 PM 52 year old female with significant history of complex regional pain syndrome, anxiety, depression, SVT, presenting complaining of shortness of breath.  Patient reports 3 weeks ago she was diagnosed with pneumonia on chest x-ray and was prescribed Z-Pak for which she took and did report improvement of symptoms.  However for the past few days she endorsed having pleuritic chest pain primary on the right side of her lungs, now having fever chills congestion and cough.  She went to see her primary care doctor today for her complaint, states they repeat a chest x-ray and noted worsening pneumonia.  Recommend patient to come to the ER to be evaluated as well as to rule out PE.  She denies any prior history of PE or DVT she denies any recent surgery, prolonged bedrest, active cancer, taking oral hormone, having leg swelling or calf pain.  She is a former smoker but denies any history of asthma or COPD.  No recent sick contact.  She did try over-the-counter medication at home without adequate relief.  On exam, patient appears uncomfortable but nontoxic.  She is mildly tachycardic but  otherwise lungs are clear to auscultation bilaterally abdomen soft nontender no evidence of peripheral edema no signs concerning for shingles  .  Vitals are reviewed and notable for elevated blood pressure of 167/94, patient's oral temperature is 99.3, heart rate is 101, respiratory rate is 24, O2 sats at 96% on room air.  Although patient does not have any significant history of PE or any significant risk factors she does have an elevated heart rate of 101 therefore she does not meet PERC criteria to rule out PE.  Will obtain D-dimer.  Initial chest x-ray obtained independently viewed interpreted by me and overall reassuring no evidence of focal infiltrate concerning for pneumonia no evidence of pleural effusion or pneumothorax.  -Labs ordered, independently viewed and interpreted by me.  Labs remarkable for Covid positive, which is likely the cause her her sxs.  D-dimer is elevated to 0.73 however sxs does not suggest PE, no chest CTA today.  Cr. 1.17 mildly elevated.  Normal WBC, normal troponin -The patient was maintained on a cardiac monitor.  I personally viewed and interpreted the cardiac monitored which showed an underlying rhythm of: sinus tachycardia -Imaging independently viewed and interpreted by me and I agree with radiologist's interpretation.  Result remarkable for CXR without focal infiltrates -This patient presents to the ED for concern of sob, this involves an extensive number of treatment options, and is a complaint that carries with it a high risk of complications and morbidity.  The differential diagnosis includes covid, flu, RSV, pna, viral illness, PE, PNA, COPD, asthma, reactive airway disease, anemia -Co morbidities that complicate the patient evaluation includes SVT, allergy, anxiety -Treatment includes tylenol -Reevaluation of the patient after these medicines showed that the patient improved -PCP office notes or outside notes reviewed -Escalation to admission/observation  considered: patient's disposition will be determine pending work up.   1:48 PM Labs and imaging obtained independently viewed interpreted by me and I agree with radiology interpretation.  Patient test positive for COVID infection which is likely explains her current presentation.  Although her D-dimer is mildly elevated at 0.73, I have low suspicion of PE and therefore I felt her chest CT angiogram  is not indicated.  I did have a shared decision making informed conversation with patient in regards to advanced imaging and patient prefers CT scan. She does have an allergy to contrast media, therefore we will have to premedicate her.  I am unsure patient's duration of her sickness to warrant Paxlovid.    2:47 PM Patient signed out to oncoming provider who will follow-up on chest CTA result and determine disposition.        Final Clinical Impression(s) / ED Diagnoses Final diagnoses:  COVID-19 virus infection    Rx / DC Orders ED Discharge Orders     None         Domenic Moras, PA-C 02/09/23 1502    Cristie Hem, MD 02/09/23 1526

## 2023-02-09 NOTE — ED Provider Notes (Signed)
  Physical Exam  BP 131/68   Pulse (!) 104   Temp 100.1 F (37.8 C) (Oral)   Resp 13   Ht 5' 1"$  (1.549 m)   Wt 113.4 kg   SpO2 99%   BMI 47.24 kg/m   Physical Exam  Procedures  Procedures  ED Course / MDM    Medical Decision Making Amount and/or Complexity of Data Reviewed Labs: ordered. Radiology: ordered.  Risk OTC drugs. Prescription drug management.   ***

## 2023-02-09 NOTE — ED Triage Notes (Signed)
Pt to ED from PCP c/o [facility] X 3 weeks , progressively getting worse. reports treated for pneumonia 2 weeks ago, went back to PCP today because feeling worse, and told chest xray "was worse"

## 2023-02-09 NOTE — Discharge Instructions (Signed)
You were evaluated in the emergency department, we performed a CT of your chest which showed no pulmonary embolism and no pneumonia.  You do have COVID-19.  Please wear a mask and sanitize her hands while around others.  Continue Motrin and Tylenol for pain and fevers.  Return to the ED if you have worsening shortness of breath or inability to tolerate oral intake.

## 2023-05-02 ENCOUNTER — Emergency Department (HOSPITAL_COMMUNITY): Payer: BC Managed Care – PPO

## 2023-05-02 ENCOUNTER — Emergency Department (HOSPITAL_COMMUNITY)
Admission: EM | Admit: 2023-05-02 | Discharge: 2023-05-02 | Disposition: A | Discharge status: Home / Self Care | Payer: BC Managed Care – PPO | Attending: Emergency Medicine | Admitting: Emergency Medicine

## 2023-05-02 ENCOUNTER — Other Ambulatory Visit: Payer: Self-pay

## 2023-05-02 ENCOUNTER — Encounter (HOSPITAL_COMMUNITY): Payer: Self-pay | Admitting: Radiology

## 2023-05-02 DIAGNOSIS — R1030 Lower abdominal pain, unspecified: Secondary | ICD-10-CM | POA: Diagnosis not present

## 2023-05-02 DIAGNOSIS — R109 Unspecified abdominal pain: Secondary | ICD-10-CM

## 2023-05-02 LAB — BASIC METABOLIC PANEL
Anion gap: 8 (ref 5–15)
BUN: 14 mg/dL (ref 6–20)
CO2: 22 mmol/L (ref 22–32)
Calcium: 8.9 mg/dL (ref 8.9–10.3)
Chloride: 108 mmol/L (ref 98–111)
Creatinine, Ser: 0.75 mg/dL (ref 0.44–1.00)
GFR, Estimated: >60 mL/min (ref >60)
Glucose, Bld: 120 mg/dL — ABNORMAL HIGH (ref 70–99)
Potassium: 3.7 mmol/L (ref 3.5–5.1)
Sodium: 138 mmol/L (ref 135–145)

## 2023-05-02 LAB — URINALYSIS, ROUTINE W REFLEX MICROSCOPIC
Bacteria, UA: NONE SEEN
Bilirubin Urine: NEGATIVE
Glucose, UA: NEGATIVE mg/dL
Ketones, ur: NEGATIVE mg/dL
Nitrite: NEGATIVE
Protein, ur: NEGATIVE mg/dL
Specific Gravity, Urine: 1.018 (ref 1.005–1.030)
pH: 5 (ref 5.0–8.0)

## 2023-05-02 LAB — CBC
HCT: 45.4 % (ref 36.0–46.0)
Hemoglobin: 15.1 g/dL — ABNORMAL HIGH (ref 12.0–15.0)
MCH: 30.8 pg (ref 26.0–34.0)
MCHC: 33.3 g/dL (ref 30.0–36.0)
MCV: 92.5 fL (ref 80.0–100.0)
Platelets: 254 10*3/uL (ref 150–400)
RBC: 4.91 MIL/uL (ref 3.87–5.11)
RDW: 12.6 % (ref 11.5–15.5)
WBC: 7.2 10*3/uL (ref 4.0–10.5)
nRBC: 0 % (ref 0.0–0.2)

## 2023-05-02 LAB — PREGNANCY, URINE: Preg Test, Ur: NEGATIVE

## 2023-05-02 MED ORDER — METHOCARBAMOL 500 MG PO TABS: 500.0000 mg | ORAL_TABLET | Freq: Two times a day (BID) | ORAL | 0 refills | Status: DC

## 2023-05-02 NOTE — ED Triage Notes (Signed)
Pt arrived via POV. C/o R flank pain, this is chronic problem, just finished course of abx in case it was a UTI.  Pt states they had transient confusion last night when they forgot who their husband was for several seconds. This was at 2200 yesterday.  Pt AOx4

## 2023-05-02 NOTE — ED Provider Notes (Signed)
EMERGENCY DEPARTMENT AT [facility] Provider Note   CSN: [id number] Arrival date & time: 05/02/23  4098     History  Chief Complaint  Patient presents with   Flank Pain   Altered Mental Status    Tiffany [name] is a 52 y.o. female.  52 year old female presents with right-sided flank pain times several days.  Patient has history of same.  States she has history of hydronephrosis but denies any dysuria or hematuria.  Does note some urinary urgency without frequency.  No fever or chills.  No nausea or vomiting.  Pain has been persistent.  Has used a heating pad.  No oral medications taken.  States that she is due for referral to urology by her primary care doctor.  Denies any vaginal bleeding which she has not been short of breath       Home Medications Prior to Admission medications   Medication Sig Start Date End Date Taking? Authorizing Provider  albuterol (PROVENTIL) (2.5 MG/3ML) 0.083% nebulizer solution Take 3 mLs (2.5 mg total) by nebulization every 6 (six) hours as needed for wheezing or shortness of breath. 04/11/22   Mancel Bale, MD  ALPRAZolam Prudy Feeler) 0.5 MG tablet Take 1 tablet (0.5 mg total) by mouth daily as needed for anxiety. Patient taking differently: Take 0.5 mg by mouth See admin instructions. 0.5mg  every night And 0.5mg  daily as needed for panic 10/03/16   English, Stephanie D, PA  diltiazem (CARDIZEM LA) 120 MG 24 hr tablet Take 120 mg by mouth daily. 09/24/22   [provider]  DULoxetine (CYMBALTA) 60 MG capsule Take 1 capsule (60 mg total) by mouth daily. 10/03/16   Trena Platt D, PA  ibuprofen (ADVIL) 200 MG tablet Take 3 tablets (600 mg total) by mouth 4 (four) times daily. 12/08/21   Diamantina Monks, MD      Allergies    Contrast media [iodinated contrast media], Stadol [butorphanol], and Other    Review of Systems   Review of Systems  All other systems reviewed and are negative.   Physical Exam Updated Vital  Signs BP (!) 149/93 (BP Location: Right Arm)   Pulse 71   Temp 98.6 F (37 C) (Oral)   Resp 18   Ht 1.549 m (5\' 1" )   Wt 113.4 kg   SpO2 97%   BMI 47.24 kg/m  Physical Exam Vitals and nursing note reviewed.  Constitutional:      General: She is not in acute distress.    Appearance: Normal appearance. She is well-developed. She is not toxic-appearing.  HENT:     Head: Normocephalic and atraumatic.  Eyes:     General: Lids are normal.     Conjunctiva/sclera: Conjunctivae normal.     Pupils: Pupils are equal, round, and reactive to light.  Neck:     Thyroid: No thyroid mass.     Trachea: No tracheal deviation.  Cardiovascular:     Rate and Rhythm: Normal rate and regular rhythm.     Heart sounds: Normal heart sounds. No murmur heard.    No gallop.  Pulmonary:     Effort: Pulmonary effort is normal. No respiratory distress.     Breath sounds: Normal breath sounds. No stridor. No decreased breath sounds, wheezing, rhonchi or rales.  Abdominal:     General: There is no distension.     Palpations: Abdomen is soft.     Tenderness: There is abdominal tenderness in the suprapubic area. There is no rebound.  Musculoskeletal:        General: No tenderness. Normal range of motion.     Cervical back: Normal range of motion and neck supple.       Back:  Skin:    General: Skin is warm and dry.     Findings: No abrasion or rash.  Neurological:     Mental Status: She is alert and oriented to person, place, and time. Mental status is at baseline.     GCS: GCS eye subscore is 4. GCS verbal subscore is 5. GCS motor subscore is 6.     Cranial Nerves: No cranial nerve deficit.     Sensory: No sensory deficit.     Motor: Motor function is intact.  Psychiatric:        Attention and Perception: Attention normal.        Speech: Speech normal.        Behavior: Behavior normal.     ED Results / Procedures / Treatments   Labs (all labs ordered are listed, but only abnormal results are  displayed) Labs Reviewed  URINALYSIS, ROUTINE W REFLEX MICROSCOPIC - Abnormal; Notable for the following components:      Result Value   APPearance HAZY (*)    Hgb urine dipstick MODERATE (*)    Leukocytes,Ua MODERATE (*)    All other components within normal limits  BASIC METABOLIC PANEL - Abnormal; Notable for the following components:   Glucose, Bld 120 (*)    All other components within normal limits  CBC - Abnormal; Notable for the following components:   Hemoglobin 15.1 (*)    All other components within normal limits  PREGNANCY, URINE    EKG None  Radiology No results found.  Procedures Procedures    Medications Ordered in ED Medications - No data to display  ED Course/ Medical Decision Making/ A&P                             Medical Decision Making Amount and/or Complexity of Data Reviewed Labs: ordered. Radiology: ordered.   Patient here with recurrent flank pain.  Has history of hydronephrosis and renal CT for interpretation did not show any new findings.  Urinalysis was negative for infection.  Her mental status here has been stable.  Do not feel she needs any neurological evaluation.  Patient did have a bladder scan which showed 200 cc of urine in her bladder.  Referral to urology.  Return precautions given with daughter at bedside who was present during his evaluation        Final Clinical Impression(s) / ED Diagnoses Final diagnoses:  None    Rx / DC Orders ED Discharge Orders     None         Lorre Nick, MD 05/02/23 1354

## 2023-05-30 ENCOUNTER — Other Ambulatory Visit: Payer: Self-pay

## 2023-05-30 ENCOUNTER — Emergency Department (HOSPITAL_COMMUNITY)
Admission: EM | Admit: 2023-05-30 | Discharge: 2023-05-30 | Disposition: A | Discharge status: Home / Self Care | Payer: BC Managed Care – PPO | Attending: Emergency Medicine | Admitting: Emergency Medicine

## 2023-05-30 ENCOUNTER — Encounter (HOSPITAL_COMMUNITY): Payer: Self-pay

## 2023-05-30 DIAGNOSIS — M542 Cervicalgia: Secondary | ICD-10-CM | POA: Diagnosis present

## 2023-05-30 DIAGNOSIS — R109 Unspecified abdominal pain: Secondary | ICD-10-CM

## 2023-05-30 DIAGNOSIS — R030 Elevated blood-pressure reading, without diagnosis of hypertension: Secondary | ICD-10-CM

## 2023-05-30 DIAGNOSIS — R591 Generalized enlarged lymph nodes: Secondary | ICD-10-CM | POA: Insufficient documentation

## 2023-05-30 DIAGNOSIS — I10 Essential (primary) hypertension: Secondary | ICD-10-CM | POA: Insufficient documentation

## 2023-05-30 LAB — CBC WITH DIFFERENTIAL/PLATELET
Abs Immature Granulocytes: 0.03 10*3/uL (ref 0.00–0.07)
Basophils Absolute: 0.1 10*3/uL (ref 0.0–0.1)
Basophils Relative: 1 %
Eosinophils Absolute: 0.2 10*3/uL (ref 0.0–0.5)
Eosinophils Relative: 2 %
HCT: 45.1 % (ref 36.0–46.0)
Hemoglobin: 14.8 g/dL (ref 12.0–15.0)
Immature Granulocytes: 0 %
Lymphocytes Relative: 25 %
Lymphs Abs: 2.5 10*3/uL (ref 0.7–4.0)
MCH: 30.5 pg (ref 26.0–34.0)
MCHC: 32.8 g/dL (ref 30.0–36.0)
MCV: 92.8 fL (ref 80.0–100.0)
Monocytes Absolute: 0.6 10*3/uL (ref 0.1–1.0)
Monocytes Relative: 6 %
Neutro Abs: 6.7 10*3/uL (ref 1.7–7.7)
Neutrophils Relative %: 66 %
Platelets: 257 10*3/uL (ref 150–400)
RBC: 4.86 MIL/uL (ref 3.87–5.11)
RDW: 12.3 % (ref 11.5–15.5)
WBC: 10 10*3/uL (ref 4.0–10.5)
nRBC: 0 % (ref 0.0–0.2)

## 2023-05-30 LAB — COMPREHENSIVE METABOLIC PANEL
ALT: 15 U/L (ref 0–44)
AST: 19 U/L (ref 15–41)
Albumin: 4.2 g/dL (ref 3.5–5.0)
Alkaline Phosphatase: 179 U/L — ABNORMAL HIGH (ref 38–126)
Anion gap: 9 (ref 5–15)
BUN: 9 mg/dL (ref 6–20)
CO2: 25 mmol/L (ref 22–32)
Calcium: 9 mg/dL (ref 8.9–10.3)
Chloride: 105 mmol/L (ref 98–111)
Creatinine, Ser: 0.9 mg/dL (ref 0.44–1.00)
GFR, Estimated: >60 mL/min (ref >60)
Glucose, Bld: 86 mg/dL (ref 70–99)
Potassium: 3.7 mmol/L (ref 3.5–5.1)
Sodium: 139 mmol/L (ref 135–145)
Total Bilirubin: 0.5 mg/dL (ref 0.3–1.2)
Total Protein: 7.5 g/dL (ref 6.5–8.1)

## 2023-05-30 MED ORDER — METHOCARBAMOL 750 MG PO TABS: 750.0000 mg | ORAL_TABLET | Freq: Three times a day (TID) | ORAL | 0 refills | Status: DC | PRN

## 2023-05-30 NOTE — ED Provider Notes (Signed)
[facility] AT [facility] Provider Note   CSN: [id number] Arrival date & time: 05/30/23  1949     History  Chief Complaint  Patient presents with   Lymphadenopathy    Tiffany [name] is a 52 y.o. female.  Patient c/o being told by her doctor that her hemoglobin was high and to go to ER. Patient indicates has chronic right flank pain for past year+ as well as pain to right side of neck for 1+ years. (states swollen lymph nodes to area, although do not appreciate currently).  No acute or abrupt change in her pain. No sore throat, runny nose, cough or uri symptoms. No fever or chills. No chest pain or sob. No nv. Normal appetite. No wt loss. No dysuria or gu c/o. No skin lesions/rash to areas of pain.   The history is provided by the patient and medical records.       Home Medications Prior to Admission medications   Medication Sig Start Date End Date Taking? Authorizing Provider  albuterol (PROVENTIL) (2.5 MG/3ML) 0.083% nebulizer solution Take 3 mLs (2.5 mg total) by nebulization every 6 (six) hours as needed for wheezing or shortness of breath. 04/11/22   Mancel Bale, MD  ALPRAZolam Prudy Feeler) 0.5 MG tablet Take 1 tablet (0.5 mg total) by mouth daily as needed for anxiety. Patient taking differently: Take 0.5 mg by mouth See admin instructions. 0.5mg  every night And 0.5mg  daily as needed for panic 10/03/16   English, Stephanie D, PA  diltiazem (CARDIZEM LA) 120 MG 24 hr tablet Take 120 mg by mouth daily. 09/24/22   [provider]  DULoxetine (CYMBALTA) 60 MG capsule Take 1 capsule (60 mg total) by mouth daily. 10/03/16   Trena Platt D, PA  ibuprofen (ADVIL) 200 MG tablet Take 3 tablets (600 mg total) by mouth 4 (four) times daily. 12/08/21   Diamantina Monks, MD  methocarbamol (ROBAXIN) 500 MG tablet Take 1 tablet (500 mg total) by mouth 2 (two) times daily. 05/02/23   Lorre Nick, MD      Allergies    Contrast media [iodinated contrast  media], Stadol [butorphanol], and Other    Review of Systems   Review of Systems  Constitutional:  Negative for chills and fever.  HENT:  Negative for sore throat and trouble swallowing.   Eyes:  Negative for redness.  Respiratory:  Negative for cough and shortness of breath.   Cardiovascular:  Negative for chest pain.  Gastrointestinal:  Negative for abdominal pain, diarrhea and vomiting.  Genitourinary:  Positive for flank pain. Negative for dysuria, vaginal bleeding and vaginal discharge.  Musculoskeletal:  Positive for myalgias. Negative for neck stiffness.  Skin:  Negative for rash.  Neurological:  Negative for headaches.  Hematological:  Does not bruise/bleed easily.  Psychiatric/Behavioral:  Negative for confusion.     Physical Exam Updated Vital Signs BP (!) 166/98 (BP Location: Right Arm)   Pulse (!) 101   Temp 98.8 F (37.1 C) (Oral)   Resp 18   Ht 1.549 m (5\' 1" )   Wt 112.5 kg   SpO2 98%   BMI 46.86 kg/m  Physical Exam Vitals and nursing note reviewed.  Constitutional:      Appearance: Normal appearance. She is well-developed.  HENT:     Head: Atraumatic.     Right Ear: Tympanic membrane, ear canal and external ear normal.     Left Ear: Tympanic membrane, ear canal and external ear normal.  Nose: Nose normal. No congestion or rhinorrhea.     Mouth/Throat:     Mouth: Mucous membranes are moist.     Pharynx: Oropharynx is clear. No oropharyngeal exudate or posterior oropharyngeal erythema.  Eyes:     General: No scleral icterus.       Right eye: No discharge.        Left eye: No discharge.     Conjunctiva/sclera: Conjunctivae normal.     Pupils: Pupils are equal, round, and reactive to light.  Neck:     Trachea: No tracheal deviation.     Comments: No stiffness or rigidity.  Cardiovascular:     Rate and Rhythm: Normal rate and regular rhythm.     Pulses: Normal pulses.     Heart sounds: Normal heart sounds. No murmur heard.    No friction rub. No  gallop.  Pulmonary:     Effort: Pulmonary effort is normal. No respiratory distress.     Breath sounds: Normal breath sounds. No stridor.  Abdominal:     General: Bowel sounds are normal. There is no distension.     Palpations: Abdomen is soft. There is no mass.     Tenderness: There is no abdominal tenderness. There is no guarding.  Genitourinary:    Comments: No cva tenderness.  Musculoskeletal:        General: No swelling.     Cervical back: Normal range of motion and neck supple. No rigidity or tenderness. No muscular tenderness.     Right lower leg: No edema.     Left lower leg: No edema.     Comments: CTLS spine, non tender, aligned, no step off.   Lymphadenopathy:     Cervical: No cervical adenopathy.  Skin:    General: Skin is warm and dry.     Findings: No rash.  Neurological:     Mental Status: She is alert.     Comments: Alert, speech normal. Motor/sens grossly intact bil. Steady gait.   Psychiatric:        Mood and Affect: Mood normal.     ED Results / Procedures / Treatments   Labs (all labs ordered are listed, but only abnormal results are displayed) Results for orders placed or performed during the hospital encounter of 05/30/23  CBC with Differential  Result Value Ref Range   WBC 10.0 4.0 - 10.5 K/uL   RBC 4.86 3.87 - 5.11 MIL/uL   Hemoglobin 14.8 12.0 - 15.0 g/dL   HCT 16.1 09.6 - 04.5 %   MCV 92.8 80.0 - 100.0 fL   MCH 30.5 26.0 - 34.0 pg   MCHC 32.8 30.0 - 36.0 g/dL   RDW 40.9 81.1 - 91.4 %   Platelets 257 150 - 400 K/uL   nRBC 0.0 0.0 - 0.2 %   Neutrophils Relative % 66 %   Neutro Abs 6.7 1.7 - 7.7 K/uL   Lymphocytes Relative 25 %   Lymphs Abs 2.5 0.7 - 4.0 K/uL   Monocytes Relative 6 %   Monocytes Absolute 0.6 0.1 - 1.0 K/uL   Eosinophils Relative 2 %   Eosinophils Absolute 0.2 0.0 - 0.5 K/uL   Basophils Relative 1 %   Basophils Absolute 0.1 0.0 - 0.1 K/uL   Immature Granulocytes 0 %   Abs Immature Granulocytes 0.03 0.00 - 0.07 K/uL   Comprehensive metabolic panel  Result Value Ref Range   Sodium 139 135 - 145 mmol/L   Potassium 3.7 3.5 - 5.1 mmol/L  Chloride 105 98 - 111 mmol/L   CO2 25 22 - 32 mmol/L   Glucose, Bld 86 70 - 99 mg/dL   BUN 9 6 - 20 mg/dL   Creatinine, Ser 4.09 0.44 - 1.00 mg/dL   Calcium 9.0 8.9 - 81.1 mg/dL   Total Protein 7.5 6.5 - 8.1 g/dL   Albumin 4.2 3.5 - 5.0 g/dL   AST 19 15 - 41 U/L   ALT 15 0 - 44 U/L   Alkaline Phosphatase 179 (H) 38 - 126 U/L   Total Bilirubin 0.5 0.3 - 1.2 mg/dL   GFR, Estimated >91 >47 mL/min   Anion gap 9 5 - 15     EKG None  Radiology No results found.  Procedures Procedures    Medications Ordered in ED Medications - No data to display  ED Course/ Medical Decision Making/ A&P                             Medical Decision Making Problems Addressed: Elevated blood pressure reading: acute illness or injury Essential hypertension: chronic illness or injury with exacerbation, progression, or side effects of treatment that poses a threat to life or bodily functions Neck pain on right side: chronic illness or injury    Details: Acute/chronic Right flank pain: chronic illness or injury    Details: Acute/chronic  Amount and/or Complexity of Data Reviewed External Data Reviewed: notes. Labs: ordered. Decision-making details documented in ED Course.  Risk Prescription drug management.   Labs sent.   Reviewed nursing notes and prior charts for additional history.  Recent ct scan for same right flank pain negative for acute process then.   Labs reviewed/interpreted by me - chem normal. Wbc and hgb normal.   On exam, do not appreciate any l/a currently.  Rec pcp f/u. Also discussed rheum f/u.   Pt currently appears stable for d/c.   Return precautions provided.           Final Clinical Impression(s) / ED Diagnoses Final diagnoses:  None    Rx / DC Orders ED Discharge Orders     None         Cathren Laine, MD 05/30/23  2219

## 2023-05-30 NOTE — ED Triage Notes (Signed)
Swollen lymph nodes to right neck, axillary, subclavian, submandibular, and in groin. This has been going on a few weeks, but had blood work done at PCP and she was referred here due to elevated WBC. Flank pain in right side for over a year.

## 2023-05-30 NOTE — Discharge Instructions (Signed)
It was our pleasure to provide your ER care today - we hope that you feel better.  Overall, your lab tests look good/normal.   Drink plenty of fluids/stay well hydrated. You may take acetaminophen or ibuprofen as need for pain. You may also try robaxin as need for muscle pain/spasm - no driving when taking.  Your blood pressure is high today, continues meds, follow heart healthy eating plan, and follow up with primary care doctor in one week.  For recent and chronic symptoms, follow up with your doctor as well as follow up with rheumatologist - see office info, call office to arrange appointment.   Return to ER if worse, new symptoms, fevers, trouble breathing, or other concern.

## 2023-06-08 NOTE — Progress Notes (Unsigned)
[facility] [phone]   [phone]  INITIAL CONSULTATION:  Patient Care Team: Alda Ponder, FNP as PCP - General (Nurse Practitioner)  CHIEF COMPLAINTS/PURPOSE OF CONSULTATION:  "Enlarged lymh nodes "  HISTORY OF PRESENTING ILLNESS:  Tiffany [name] 52 y.o. female with medical history significant for complex regional pain syndrome, SVT, anxiety, PTSD.  On review of the previous records patient is being referred for enlarged lymph nodes, significant family history and previous abnormal CBC. Patient went to urgent care approximately one month ago and was prescribed keflex for lymphadenopathy although did not complete the course because it caused diarrhea. At PCP visit on 05/28/23 she had elevated WBC 11.1, hemoglobin 19.8, hematocrit 60.5, RBC 6.62, ANC 7.5, IgG 495.0 and IgM <36, CRP 13. PCP ordered ultrasound of thyroid and soft tissue of neck.  She was evaluated in the ED on 05/30/23 for lymphadenopathy and elevated hemoglobin. Per ED note she endorsed chronic right flank pain and right side of neck for > 1 year. CBC in the ED unremarkable. Patient recently saw gynecologist and had pelvic ultrasound and mammogram.   On exam today patient is accompanied by her spouse. She reports swelling in multiple lymph nodes on right side of her body x 4 weeks. She first felt a chest pain that was considered a pulled muscle. Pain persisted after a week which prompted her to see her PCP. She reports the pain feeling like a sharp pulling. Pain is constant, worse with movement or putting pressure on her right side. She reports pain is still present and is now located in her right arm as well.  Patient feels as if the lymph nodes are getting larger, they are hard when palpated.  Patient denies any recent illness, had COVID in February 2024.  She did have 2 COVID vaccinations with last 1 being in July 2021.  Patient endorses fatigue x 1 year that has  drastically worsened in the last month.  She is also noticed early satiety and night sweats x 1 month as well.  She admits to shortness of breath with activity which has been ongoing x 1 year, no acute changes.  She has had night sweats for "a while now" that have seemed to worsen over the last month.  She denies any fevers or unintentional weight loss.  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  She also reports recent vaginal bleeding after a year without menstrual cycle.  She was seen by oncologist and had endometrial biopsy last week.  She is waiting on the results.  Patient is scheduled for ultrasound of her thyroid and soft tissue neck later this afternoon.  She did have a mammogram recently and was normal.  She endorses tobacco use in the past, quit smoking around age 89 and smoked 1 pack/day x 15 years.  She denies history of abnormal Pap smears.  She has family history of sister passing away from renal cell carcinoma, her daughter passed away from Ewing sarcoma.  MEDICAL HISTORY:  Past Medical History:  Diagnosis Date   Allergy    Anxiety    Arthritis    CRPS (complex regional pain syndrome type I)    Depression    DES exposure in utero    SVT (supraventricular tachycardia)     SURGICAL HISTORY: Past Surgical History:  Procedure Laterality Date   CESAREAN SECTION     x2  CHOLECYSTECTOMY     LAPAROSCOPIC APPENDECTOMY N/A 12/08/2021   Procedure: APPENDECTOMY LAPAROSCOPIC;  Surgeon: Diamantina Monks, MD;  Location: [facility] OR;  Service: General;  Laterality: N/A;   SALPINGOSTOMY     due to ectopic pregnancy    SOCIAL HISTORY: Social History   Socioeconomic History   Marital status: Married    Spouse name: Kennedy Bucker   Number of children: 3   Years of education: Not on file   Highest education level: Not on file  Occupational History   Occupation: CNA    Comment: [employer]  Tobacco Use    Smoking status: Former    Packs/day: 0.50    Years: 15.00    Additional pack years: 0.00    Total pack years: 7.50    Types: Cigarettes   Smokeless tobacco: Never   Tobacco comments:    quit in May 2019  Vaping Use   Vaping Use: Every day   Substances: Nicotine, Flavoring  Substance and Sexual Activity   Alcohol use: Yes    Comment: rarely   Drug use: No    Types: MDMA (Ecstacy)   Sexual activity: Yes    Partners: Female    Birth control/protection: Surgical  Other Topics Concern   Not on file  Social History Narrative   Lives with her husband, and their 3 children.   Social Determinants of Health   Financial Resource Strain: Not on file  Food Insecurity: No Food Insecurity (06/11/2023)   Hunger Vital Sign    Worried About Running Out of Food in the Last Year: Never true    Ran Out of Food in the Last Year: Never true  Transportation Needs: No Transportation Needs (06/11/2023)   PRAPARE - Administrator, Civil Service (Medical): No    Lack of Transportation (Non-Medical): No  Physical Activity: Not on file  Stress: Not on file  Social Connections: Not on file  Intimate Partner Violence: Not At Risk (06/11/2023)   Humiliation, Afraid, Rape, and Kick questionnaire    Fear of Current or Ex-Partner: No    Emotionally Abused: No    Physically Abused: No    Sexually Abused: No    FAMILY HISTORY: Family History  Problem Relation Age of Onset   Heart disease Mother    Hyperlipidemia Mother    Hypertension Mother    Mental illness Mother    COPD Mother    Mental illness Father        bipolar   Hyperlipidemia Sister    Hypertension Sister    Supraventricular tachycardia Sister    Hypertension Brother    Cancer Maternal Grandmother    Heart disease Maternal Grandmother    Hyperlipidemia Maternal Grandmother    Hypertension Maternal Grandmother    Stroke Maternal Grandmother    Supraventricular tachycardia Sister    Arthritis Daughter        JRA    Arthritis Daughter        JRA    ALLERGIES:  is allergic to contrast media [iodinated contrast media], stadol [butorphanol], and other.  MEDICATIONS:  Current Outpatient Medications  Medication Sig Dispense Refill   albuterol (PROVENTIL) (2.5 MG/3ML) 0.083% nebulizer solution Take 3 mLs (2.5 mg total) by nebulization every 6 (six) hours as needed for wheezing or shortness of breath. 150 mL 1   ALPRAZolam (XANAX) 0.5 MG tablet Take 1 tablet (0.5 mg total) by mouth daily as needed for anxiety. (Patient taking differently: Take 0.5 mg by mouth See admin  instructions. 0.5mg  every night And 0.5mg  daily as needed for panic) 30 tablet 0   diltiazem (CARDIZEM LA) 120 MG 24 hr tablet Take 120 mg by mouth daily.     DULoxetine (CYMBALTA) 60 MG capsule Take 1 capsule (60 mg total) by mouth daily. 30 capsule 5   ibuprofen (ADVIL) 200 MG tablet Take 3 tablets (600 mg total) by mouth 4 (four) times daily. 120 tablet 1   methocarbamol (ROBAXIN) 750 MG tablet Take 1 tablet (750 mg total) by mouth 3 (three) times daily as needed (muscle spasm/pain). 15 tablet 0   No current facility-administered medications for this visit.    REVIEW OF SYSTEMS:   All other systems are reviewed and are negative for acute change except as noted in the HPI.  PHYSICAL EXAMINATION: ECOG PERFORMANCE STATUS: 1 - Symptomatic but completely ambulatory  Vitals:   06/11/23 0957  BP: 136/78  Pulse: 87  Resp: 18  Temp: 99.1 F (37.3 C)  SpO2: 97%   Filed Weights   06/11/23 0957  Weight: 251 lb (113.9 kg)    Physical Exam Vitals reviewed.  Constitutional:      Appearance: She is not ill-appearing or toxic-appearing.  HENT:     Head: Normocephalic.     Nose: Nose normal.     Mouth/Throat:     Mouth: Mucous membranes are moist.     Pharynx: Oropharynx is clear.  Eyes:     Conjunctiva/sclera: Conjunctivae normal.  Cardiovascular:     Rate and Rhythm: Normal rate and regular rhythm.     Pulses: Normal pulses.           Radial pulses are 2+ on the right side.     Heart sounds: Normal heart sounds.  Pulmonary:     Effort: Pulmonary effort is normal.     Breath sounds: Normal breath sounds.  Abdominal:     General: There is no distension.     Palpations: Abdomen is soft.  Musculoskeletal:        General: Normal range of motion.     Cervical back: Normal range of motion. No tenderness.     Right lower leg: No edema.     Left lower leg: No edema.  Lymphadenopathy:     Head:     Right side of head: Submandibular adenopathy present.     Upper Body:     Right upper body: Axillary adenopathy and epitrochlear adenopathy present.  Skin:    General: Skin is warm and dry.     Capillary Refill: Capillary refill takes less than 2 seconds.  Neurological:     General: No focal deficit present.     Mental Status: She is alert.       LABORATORY DATA:  I have reviewed the data as listed    Latest Ref Rng & Units 06/11/2023   11:36 AM 05/30/2023    8:26 PM 05/02/2023    8:15 AM  CBC  WBC 4.0 - 10.5 K/uL 7.4  10.0  7.2   Hemoglobin 12.0 - 15.0 g/dL 16.1  09.6  04.5   Hematocrit 36.0 - 46.0 % 44.4  45.1  45.4   Platelets 150 - 400 K/uL 234  257  254        Latest Ref Rng & Units 06/11/2023   11:36 AM 05/30/2023    8:26 PM 05/02/2023    8:15 AM  CMP  Glucose 70 - 99 mg/dL 409  86  811   BUN 6 - 20  mg/dL 9  9  14    Creatinine 0.44 - 1.00 mg/dL 4.09  8.11  9.14   Sodium 135 - 145 mmol/L 137  139  138   Potassium 3.5 - 5.1 mmol/L 3.8  3.7  3.7   Chloride 98 - 111 mmol/L 104  105  108   CO2 22 - 32 mmol/L 27  25  22    Calcium 8.9 - 10.3 mg/dL 9.3  9.0  8.9   Total Protein 6.5 - 8.1 g/dL 6.9  7.5    Total Bilirubin 0.3 - 1.2 mg/dL 0.5  0.5    Alkaline Phos 38 - 126 U/L 193  179    AST 15 - 41 U/L 13  19    ALT 0 - 44 U/L 10  15       RADIOGRAPHIC STUDIES: I have personally reviewed the radiological images as listed and agreed with the findings in the report. No results found.  ASSESSMENT &  PLAN Tiffany [name] is a 52 y.o. female [facility] for consultation regarding enlarged lymph nodes. We have reviewed etiologies including infection, laboratory process, lymphoproliferative disorder, or malignancy. Patient will proceed with laboratory workup today   #Right sided lymphadenopathy -Labs collected today include CBC, CMP, ESR, CRP, LDH, flow cytometry, Hep B & C serologies, HIV serology. -Patient is scheduled for ultrasound of thyroid and soft tissue neck later this afternoon.  Results will go to PCP however I will watch in Care Everywhere for result.  If imaging is negative would consider CT chest for further evaluation. -Patient will RTC when work up is complete.  Patient expressed understanding of the recommended workup and is agreeable to move forward.  All questions were answered. The patient knows to call the clinic with any problems, questions or concerns.  Shared visit with Dr. Leonides Schanz.   Orders Placed This Encounter  Procedures   CBC with Differential (Cancer Center Only)    Standing Status:   Future    Number of Occurrences:   1    Standing Expiration Date:   06/10/2024   CMP (Cancer Center only)    Standing Status:   Future    Number of Occurrences:   1    Standing Expiration Date:   06/10/2024   Sedimentation rate    Standing Status:   Future    Number of Occurrences:   1    Standing Expiration Date:   06/10/2024   C-reactive protein    Standing Status:   Future    Number of Occurrences:   1    Standing Expiration Date:   06/10/2024   Flow Cytometry, Peripheral Blood (Oncology)    Standing Status:   Future    Number of Occurrences:   1    Standing Expiration Date:   06/10/2024   HIV antibody (with reflex)    Standing Status:   Future    Number of Occurrences:   1    Standing Expiration Date:   06/10/2024   Hepatitis C antibody    Standing Status:   Future    Number of Occurrences:   1    Standing Expiration Date:   06/10/2024    Hepatitis B core antibody, total    Standing Status:   Future    Number of Occurrences:   1    Standing Expiration Date:   06/10/2024   Hepatitis B surface antibody    Standing Status:   Future    Number of  Occurrences:   1    Standing Expiration Date:   06/10/2024   Hepatitis B surface antigen    Standing Status:   Future    Number of Occurrences:   1    Standing Expiration Date:   06/10/2024   Lactate dehydrogenase (LDH)    Standing Status:   Future    Number of Occurrences:   1    Standing Expiration Date:   06/10/2024      I have spent a total of 60 minutes minutes of face-to-face and non-face-to-face time, preparing to see the patient, obtaining and/or reviewing separately obtained history, performing a medically appropriate examination, counseling and educating the patient, ordering medications/tests/procedures, referring and communicating with other health care professionals, documenting clinical information in the electronic health record, independently interpreting results and communicating results to the patient, and care coordination.   Namon Cirri PA-C Department of Hematology/Oncology [facility] Phone: [phone]   I have read the above note and personally examined the patient. I agree with the assessment and plan as noted above.  Briefly Mrs. Karl Staunton is a 52 year old female who presents for evaluation of lymphadenopathy.  She is intense in her neck, in the supraclavicular region, and in her epitrochlear nodes on the right arm.  She notes that these been present for a little over 1 month and are tender to palpation.  She has had no recent viral illnesses.  She has not been having any issues with fevers, chills, sweats, nausea, vomiting or diarrhea.  She has an ultrasound scheduled later this week in order to better visualize the lymph nodes in her neck.  Pending the results of this ultrasound we can determine if we require CT  imaging of the chest and right arm.  The patient voiced understanding of our findings and the plan moving forward.  Today we will order routine baseline blood work to include CBC, CMP, LDH, and inflammatory markers.  Ulysees Barns, MD Department of Hematology/Oncology [facility] Phone: [phone] Pager: [phone] Email: [email or web address]   ADDENDUM 06/14/23 10:09 AM  I notified Shelva Majestic Marrin by phone regarding US thyroid and neck results. I am able to see the report from [place] although not the image as it was performed in the [facility] system. It is noted unremarkable sonographic appearance of the thyroid gland. No lymphadenopathy. Suspected left-sided parathyroid adenoma. Will proceed with CT chest and [place] right arm to further evaluate concern for lymphadenopathy. Orders have been placed. Premedications were prescribed for CT as she has documented history of contrast allergy. Discussed with patient follow up with pcp for benign parathyroid adenoma however she prefers to establish care with ENT. Referral will be sent. Patient aware I will call once we have CT and [place] results. All of patient's questions were answered and she expressed understanding of the plan provided.  -Namon Cirri PA-C

## 2023-06-11 ENCOUNTER — Inpatient Hospital Stay: Payer: BC Managed Care – PPO | Attending: Physician Assistant

## 2023-06-11 ENCOUNTER — Inpatient Hospital Stay: Payer: BC Managed Care – PPO | Admitting: Physician Assistant

## 2023-06-11 VITALS — BP 136/78 | HR 87 | Temp 99.1°F | Resp 18 | Wt 251.0 lb

## 2023-06-11 DIAGNOSIS — Z87891 Personal history of nicotine dependence: Secondary | ICD-10-CM | POA: Insufficient documentation

## 2023-06-11 DIAGNOSIS — R591 Generalized enlarged lymph nodes: Secondary | ICD-10-CM | POA: Insufficient documentation

## 2023-06-11 DIAGNOSIS — G9050 Complex regional pain syndrome I, unspecified: Secondary | ICD-10-CM

## 2023-06-11 LAB — CBC WITH DIFFERENTIAL (CANCER CENTER ONLY)
Abs Immature Granulocytes: 0.02 10*3/uL (ref 0.00–0.07)
Basophils Absolute: 0 10*3/uL (ref 0.0–0.1)
Basophils Relative: 0 %
Eosinophils Absolute: 0.2 10*3/uL (ref 0.0–0.5)
Eosinophils Relative: 2 %
HCT: 44.4 % (ref 36.0–46.0)
Hemoglobin: 15.7 g/dL — ABNORMAL HIGH (ref 12.0–15.0)
Immature Granulocytes: 0 %
Lymphocytes Relative: 26 %
Lymphs Abs: 1.9 10*3/uL (ref 0.7–4.0)
MCH: 31.8 pg (ref 26.0–34.0)
MCHC: 35.4 g/dL (ref 30.0–36.0)
MCV: 89.9 fL (ref 80.0–100.0)
Monocytes Absolute: 0.4 10*3/uL (ref 0.1–1.0)
Monocytes Relative: 5 %
Neutro Abs: 4.9 10*3/uL (ref 1.7–7.7)
Neutrophils Relative %: 67 %
Platelet Count: 234 10*3/uL (ref 150–400)
RBC: 4.94 MIL/uL (ref 3.87–5.11)
RDW: 12.3 % (ref 11.5–15.5)
WBC Count: 7.4 10*3/uL (ref 4.0–10.5)
nRBC: 0 % (ref 0.0–0.2)

## 2023-06-11 LAB — CMP (CANCER CENTER ONLY)
ALT: 10 U/L (ref 0–44)
AST: 13 U/L — ABNORMAL LOW (ref 15–41)
Albumin: 4 g/dL (ref 3.5–5.0)
Alkaline Phosphatase: 193 U/L — ABNORMAL HIGH (ref 38–126)
Anion gap: 6 (ref 5–15)
BUN: 9 mg/dL (ref 6–20)
CO2: 27 mmol/L (ref 22–32)
Calcium: 9.3 mg/dL (ref 8.9–10.3)
Chloride: 104 mmol/L (ref 98–111)
Creatinine: 0.76 mg/dL (ref 0.44–1.00)
GFR, Estimated: >60 mL/min (ref >60)
Glucose, Bld: 134 mg/dL — ABNORMAL HIGH (ref 70–99)
Potassium: 3.8 mmol/L (ref 3.5–5.1)
Sodium: 137 mmol/L (ref 135–145)
Total Bilirubin: 0.5 mg/dL (ref 0.3–1.2)
Total Protein: 6.9 g/dL (ref 6.5–8.1)

## 2023-06-11 LAB — HEPATITIS C ANTIBODY: HCV Ab: NONREACTIVE

## 2023-06-11 LAB — C-REACTIVE PROTEIN: CRP: 0.8 mg/dL (ref <1.0)

## 2023-06-11 LAB — HIV ANTIBODY (ROUTINE TESTING W REFLEX): HIV Screen 4th Generation wRfx: NONREACTIVE

## 2023-06-11 LAB — HEPATITIS B SURFACE ANTIBODY,QUALITATIVE: Hep B S Ab: REACTIVE — AB

## 2023-06-11 LAB — HEPATITIS B CORE ANTIBODY, TOTAL: Hep B Core Total Ab: NONREACTIVE

## 2023-06-11 LAB — LACTATE DEHYDROGENASE: LDH: 132 U/L (ref 98–192)

## 2023-06-11 LAB — SEDIMENTATION RATE: Sed Rate: 10 mm/hr (ref 0–22)

## 2023-06-11 LAB — HEPATITIS B SURFACE ANTIGEN: Hepatitis B Surface Ag: NONREACTIVE

## 2023-06-12 LAB — SURGICAL PATHOLOGY

## 2023-06-13 LAB — FLOW CYTOMETRY

## 2023-06-14 ENCOUNTER — Encounter: Payer: Self-pay | Admitting: Physician Assistant

## 2023-06-14 ENCOUNTER — Telehealth: Payer: Self-pay | Admitting: Physician Assistant

## 2023-06-14 DIAGNOSIS — D351 Benign neoplasm of parathyroid gland: Secondary | ICD-10-CM

## 2023-06-14 DIAGNOSIS — R591 Generalized enlarged lymph nodes: Secondary | ICD-10-CM

## 2023-06-14 MED ORDER — PREDNISONE 50 MG PO TABS: ORAL_TABLET | ORAL | 0 refills | Status: DC

## 2023-06-14 NOTE — Telephone Encounter (Signed)
I notified [name] by phone regarding US thyroid and neck results. I am able to see the report from [place] although not the image as it was performed in the [facility] system. It is noted unremarkable sonographic appearance of the thyroid gland. No lymphadenopathy. Suspected left-sided parathyroid adenoma. Will proceed with CT chest and [place] right arm to further evaluate concern for lymphadenopathy. Orders have been placed. Premedications were prescribed for CT as she has documented history of contrast allergy. Discussed with patient follow up with pcp for benign parathyroid adenoma however she prefers to establish care with ENT. Referral will be sent. Patient aware I will call once we have CT and [place] results. All of patient's questions were answered and she expressed understanding of the plan provided.

## 2023-06-18 ENCOUNTER — Ambulatory Visit (HOSPITAL_COMMUNITY)
Admission: RE | Admit: 2023-06-18 | Discharge: 2023-06-18 | Disposition: A | Discharge status: Home / Self Care | Payer: BC Managed Care – PPO | Source: Ambulatory Visit | Attending: Physician Assistant | Admitting: Physician Assistant

## 2023-06-18 DIAGNOSIS — R591 Generalized enlarged lymph nodes: Secondary | ICD-10-CM | POA: Insufficient documentation

## 2023-06-18 MED ORDER — IOHEXOL 300 MG/ML  SOLN
75.0000 mL | Freq: Once | INTRAMUSCULAR | Status: AC | PRN
  Administered 2023-06-18: 75 mL via INTRAVENOUS

## 2023-06-19 ENCOUNTER — Telehealth: Payer: Self-pay | Admitting: Physician Assistant

## 2023-06-19 NOTE — Telephone Encounter (Signed)
I notified [name] by phone regarding CT chest and [place] right arm results. US findings are consistent with lipomas and CT Chest without lymphadenopathy or evidence for intrathoracic malignancy. 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 No additional oncology work up is warranted based on results. All of patient's questions were answered and she expressed understanding of the plan provided.

## 2023-06-22 ENCOUNTER — Ambulatory Visit (HOSPITAL_COMMUNITY): Payer: BC Managed Care – PPO

## 2023-06-22 ENCOUNTER — Other Ambulatory Visit (HOSPITAL_COMMUNITY): Payer: BC Managed Care – PPO

## 2023-09-14 ENCOUNTER — Other Ambulatory Visit: Payer: Self-pay

## 2023-09-14 ENCOUNTER — Emergency Department (HOSPITAL_COMMUNITY)
Admission: EM | Admit: 2023-09-14 | Discharge: 2023-09-14 | Discharge status: Against Medical Advice | Payer: BC Managed Care – PPO | Attending: Emergency Medicine | Admitting: Emergency Medicine

## 2023-09-14 ENCOUNTER — Emergency Department (HOSPITAL_COMMUNITY): Payer: BC Managed Care – PPO

## 2023-09-14 DIAGNOSIS — R079 Chest pain, unspecified: Secondary | ICD-10-CM | POA: Insufficient documentation

## 2023-09-14 DIAGNOSIS — Z5321 Procedure and treatment not carried out due to patient leaving prior to being seen by health care provider: Secondary | ICD-10-CM | POA: Insufficient documentation

## 2023-09-14 LAB — HCG, SERUM, QUALITATIVE: Preg, Serum: NEGATIVE

## 2023-09-14 LAB — CBC
HCT: 46.4 % — ABNORMAL HIGH (ref 36.0–46.0)
Hemoglobin: 15.1 g/dL — ABNORMAL HIGH (ref 12.0–15.0)
MCH: 30.4 pg (ref 26.0–34.0)
MCHC: 32.5 g/dL (ref 30.0–36.0)
MCV: 93.5 fL (ref 80.0–100.0)
Platelets: 259 10*3/uL (ref 150–400)
RBC: 4.96 MIL/uL (ref 3.87–5.11)
RDW: 12.6 % (ref 11.5–15.5)
WBC: 7.5 10*3/uL (ref 4.0–10.5)
nRBC: 0 % (ref 0.0–0.2)

## 2023-09-14 LAB — BASIC METABOLIC PANEL
Anion gap: 7 (ref 5–15)
BUN: 10 mg/dL (ref 6–20)
CO2: 28 mmol/L (ref 22–32)
Calcium: 8.9 mg/dL (ref 8.9–10.3)
Chloride: 104 mmol/L (ref 98–111)
Creatinine, Ser: 0.84 mg/dL (ref 0.44–1.00)
GFR, Estimated: >60 mL/min (ref >60)
Glucose, Bld: 115 mg/dL — ABNORMAL HIGH (ref 70–99)
Potassium: 3.7 mmol/L (ref 3.5–5.1)
Sodium: 139 mmol/L (ref 135–145)

## 2023-09-14 LAB — TROPONIN I (HIGH SENSITIVITY): Troponin I (High Sensitivity): <2 ng/L (ref <18)

## 2023-09-14 NOTE — ED Notes (Signed)
Pt called forlab work. No response from lobby.

## 2023-09-14 NOTE — ED Triage Notes (Signed)
Pt arrived via POV. C/o chest pain and pressure that began at 0915 this am. Pain is generalized. No SOB or N/V. Pain is made worse by movement

## 2023-11-15 ENCOUNTER — Emergency Department (HOSPITAL_COMMUNITY): Payer: BC Managed Care – PPO

## 2023-11-15 ENCOUNTER — Encounter (HOSPITAL_COMMUNITY): Payer: Self-pay

## 2023-11-15 ENCOUNTER — Other Ambulatory Visit: Payer: Self-pay

## 2023-11-15 ENCOUNTER — Observation Stay (HOSPITAL_COMMUNITY)
Admission: EM | Admit: 2023-11-15 | Discharge: 2023-11-16 | Disposition: A | Discharge status: Home / Self Care | Payer: BC Managed Care – PPO | Attending: Internal Medicine | Admitting: Internal Medicine

## 2023-11-15 DIAGNOSIS — Z8679 Personal history of other diseases of the circulatory system: Secondary | ICD-10-CM

## 2023-11-15 DIAGNOSIS — R079 Chest pain, unspecified: Secondary | ICD-10-CM | POA: Diagnosis not present

## 2023-11-15 DIAGNOSIS — R0789 Other chest pain: Secondary | ICD-10-CM | POA: Diagnosis present

## 2023-11-15 DIAGNOSIS — F418 Other specified anxiety disorders: Secondary | ICD-10-CM | POA: Diagnosis present

## 2023-11-15 DIAGNOSIS — Z87891 Personal history of nicotine dependence: Secondary | ICD-10-CM | POA: Diagnosis not present

## 2023-11-15 LAB — TROPONIN I (HIGH SENSITIVITY)
Troponin I (High Sensitivity): 2 ng/L (ref <18)
Troponin I (High Sensitivity): 3 ng/L (ref <18)

## 2023-11-15 LAB — CBC
HCT: 42.4 % (ref 36.0–46.0)
Hemoglobin: 14.3 g/dL (ref 12.0–15.0)
MCH: 30.7 pg (ref 26.0–34.0)
MCHC: 33.7 g/dL (ref 30.0–36.0)
MCV: 91 fL (ref 80.0–100.0)
Platelets: 244 10*3/uL (ref 150–400)
RBC: 4.66 MIL/uL (ref 3.87–5.11)
RDW: 12.7 % (ref 11.5–15.5)
WBC: 8.4 10*3/uL (ref 4.0–10.5)
nRBC: 0 % (ref 0.0–0.2)

## 2023-11-15 LAB — D-DIMER, QUANTITATIVE: D-Dimer, Quant: 0.43 ug{FEU}/mL (ref 0.00–0.50)

## 2023-11-15 LAB — BASIC METABOLIC PANEL
Anion gap: 10 (ref 5–15)
BUN: 10 mg/dL (ref 6–20)
CO2: 23 mmol/L (ref 22–32)
Calcium: 9 mg/dL (ref 8.9–10.3)
Chloride: 106 mmol/L (ref 98–111)
Creatinine, Ser: 0.92 mg/dL (ref 0.44–1.00)
GFR, Estimated: >60 mL/min (ref >60)
Glucose, Bld: 110 mg/dL — ABNORMAL HIGH (ref 70–99)
Potassium: 3.6 mmol/L (ref 3.5–5.1)
Sodium: 139 mmol/L (ref 135–145)

## 2023-11-15 MED ORDER — NITROGLYCERIN 0.4 MG SL SUBL
0.4000 mg | SUBLINGUAL_TABLET | SUBLINGUAL | Status: DC | PRN
  Administered 2023-11-15: 0.4 mg via SUBLINGUAL
  Filled 2023-11-15: qty 1

## 2023-11-15 NOTE — ED Triage Notes (Signed)
Pt came in via POV d/t central CP the last few months & has been treated for angina by her PCP. Does radiate into both shoulders & back. Reports here to ED d/t worsening pain & SOB & her PCP reporting their EKG showing probable LVH & was given a STAT referral to Cardiologist & was told if it happened again or worsens to go to ED. A/Ox4, rates her CP 8/10.

## 2023-11-15 NOTE — ED Provider Notes (Signed)
[place] EMERGENCY DEPARTMENT AT [facility] Provider Note   CSN: [id number] Arrival date & time: 11/15/23  1657     History  Chief Complaint  Patient presents with   Chest Pain   Shortness of Breath    Tiffany [name] is a 52 y.o. female with PMH as listed below who presents with d/t central CP the last few months & has been treated for angina by her PCP. Has h/o complex regional pain syndrome, SVT, anxiety, PTSD. Reports chest pain that starts when she gets up and walks around. Pain is central and radiates to entire chest and her back. Associated with shortness of breath, trembling, nausea. Only thing that helps is to lay down and rest. Today it intensified at 3 pm when she was sitting at her desk at work. This has been going on for a few weeks but is becoming more frequent and now daily and more intense. Denies h/o DVT/PE, h/o MI. Doesn't take hormones. No recent hospitalizations/surgeries/travel. Endorses bilateral leg swelling she notices in the morning, no unilateral leg swelling. Had palpitations and dizziness when got up to go to the bathroom today after a nap. Saw her PCP at [facility] for cardiology on 11/12/23 who recommended a cardiology consult, but they cannot see her in clinic until January 2025. Per PCP note: Tiffany [name] is seen today for concerns of ongoing chest pain. She describes the pain as not burning, or throbbing, but aching and radiates to shoulders and back. Begins at center of her chest. States it "feels like SVT pain without SV. Has tried isosorbide, no relief, is on diltiazem daily. Has seen cardiology in the past but not since her move. The chest pain is worse with exertion, and is now accompanied by SOB, and nausea and chills. Last several minutes. Resting and laying ion her left side helps to resolve the pain. The pain is occurring multiple times a day to varying degrees She is also starting semaglutide for weight loss- will begin at 10 units (0.25 mg) and plan to  titrate as tolerated every 4 weeks.    Past Medical History:  Diagnosis Date   Allergy    Anxiety    Arthritis    CRPS (complex regional pain syndrome type I)    Depression    DES exposure in utero    SVT (supraventricular tachycardia) (HCC)        Home Medications Prior to Admission medications   Medication Sig Start Date End Date Taking? Authorizing Provider  albuterol (PROVENTIL) (2.5 MG/3ML) 0.083% nebulizer solution Take 3 mLs (2.5 mg total) by nebulization every 6 (six) hours as needed for wheezing or shortness of breath. 04/11/22   Mancel Bale, MD  ALPRAZolam Prudy Feeler) 0.5 MG tablet Take 1 tablet (0.5 mg total) by mouth daily as needed for anxiety. Patient taking differently: Take 0.5 mg by mouth See admin instructions. 0.5mg  every night And 0.5mg  daily as needed for panic 10/03/16   English, Stephanie D, PA  diltiazem (CARDIZEM LA) 120 MG 24 hr tablet Take 120 mg by mouth daily. 09/24/22   [provider]  DULoxetine (CYMBALTA) 60 MG capsule Take 1 capsule (60 mg total) by mouth daily. 10/03/16   Trena Platt D, PA  ibuprofen (ADVIL) 200 MG tablet Take 3 tablets (600 mg total) by mouth 4 (four) times daily. 12/08/21   Diamantina Monks, MD  methocarbamol (ROBAXIN) 750 MG tablet Take 1 tablet (750 mg total) by mouth 3 (three) times daily as needed (muscle spasm/pain).  05/30/23   Cathren Laine, MD  predniSONE (DELTASONE) 50 MG tablet Take 1 pill 13 hours prior to scan. Then take 1 pill 7 hours prior. Then take 1 pill 1 hour prior 06/14/23   Shanon Ace, PA-C      Allergies    Contrast media [iodinated contrast media], Stadol [butorphanol], and Other    Review of Systems   Review of Systems A 10 point review of systems was performed and is negative unless otherwise reported in HPI.  Physical Exam Updated Vital Signs BP 139/74 (BP Location: Right Arm)   Pulse 74   Temp 98.3 F (36.8 C) (Oral)   Resp 18   Ht 5\' 3"  (1.6 m)   Wt 113.4 kg   SpO2 100%    BMI 44.29 kg/m  Physical Exam General: Normal appearing female, lying in bed.  HEENT: PERRLA, Sclera anicteric, MMM, trachea midline.  Cardiology: RRR, no murmurs/rubs/gallops. BL radial and DP pulses equal bilaterally. No Chest wall TTP.  Resp: Normal respiratory rate and effort. CTAB, no wheezes, rhonchi, crackles.  Abd: Soft, non-tender, non-distended. No rebound tenderness or guarding.  GU: Deferred. MSK: No peripheral edema or signs of trauma. Extremities without deformity or TTP. No cyanosis or clubbing. Skin: warm, dry. No rashes or lesions. Back: No CVA tenderness Neuro: A&Ox4, CNs II-XII grossly intact. MAEs. Sensation grossly intact.  Psych: Normal mood and affect.   ED Results / Procedures / Treatments   Labs (all labs ordered are listed, but only abnormal results are displayed) Labs Reviewed  BASIC METABOLIC PANEL - Abnormal; Notable for the following components:      Result Value   Glucose, Bld 110 (*)    All other components within normal limits  CBC  D-DIMER, QUANTITATIVE  TROPONIN I (HIGH SENSITIVITY)  TROPONIN I (HIGH SENSITIVITY)    EKG EKG Interpretation Date/Time:  Thursday November 15 2023 22:51:13 EST Ventricular Rate:  65 PR Interval:  175 QRS Duration:  101 QT Interval:  446 QTC Calculation: 464 R Axis:   3  Text Interpretation: Sinus rhythm Low voltage, precordial leads RSR' in V1 or V2, right VCD or RVH Similar to prior EKGs Confirmed by Vivi Barrack [id number]) on 11/16/2023 6:59:02 PM  Radiology DG Chest 2 View  Result Date: 11/15/2023 CLINICAL DATA:  Chest pain for several months. EXAM: CHEST - 2 VIEW COMPARISON:  September 14, 2023. FINDINGS: The heart size and mediastinal contours are within normal limits. Both lungs are clear. The visualized skeletal structures are unremarkable. IMPRESSION: No active cardiopulmonary disease. Electronically Signed   By: Lupita Raider M.D.   On: 11/15/2023 18:29    Procedures Procedures    Medications  Ordered in ED Medications  nitroGLYCERIN (NITROSTAT) SL tablet 0.4 mg (has no administration in time range)    ED Course/ Medical Decision Making/ A&P                          Medical Decision Making Amount and/or Complexity of Data Reviewed Labs: ordered. Decision-making details documented in ED Course. Radiology: ordered.  Risk Prescription drug management. Decision regarding hospitalization.    This patient presents to the ED for concern of CP, this involves an extensive number of treatment options, and is a complaint that carries with it a high risk of complications and morbidity.  I considered the following differential and admission for this acute, potentially life threatening condition.   MDM:    DDX for chest pain includes but  is not limited to:  Consider ACS vs angina, given exertional nature of symptoms. Also reports h/o SVT and has had palpitations/dizziness today, consider possible arrhythmia, though EKG shows NSR. Patient cannot PERC out based on age, but will obtain D dimer and reassess, minimal risk factors for PE. Lower c/f dissection with chronicity of symptoms, equal pulses in all extremities, and normal CXR. No abdominal pain and no c/f biliary disease. Consider GERD, given known history. Will trial nitroglycerin for CP.    Clinical Course as of 11/19/23 1421  Thu Nov 15, 2023  2252 BMP, troponinx2, and CBC unremarkable. [HN]  2307 D/w cardiology Dr. Laurelyn Sickle who states that obs with medicine would be good course of action. [HN]  2326 D-Dimer, Quant: 0.43 Neg d-dimer [HN]    Clinical Course User Index [HN] Loetta Rough, MD    Labs: I Ordered, and personally interpreted labs.  The pertinent results include:  those listed above  Imaging Studies ordered: CXR ordered from triage.  I independently visualized and interpreted imaging. I agree with the radiologist interpretation  Additional history obtained from chart review.  External records from outside  source obtained and reviewed including [facility]  Cardiac Monitoring: The patient was maintained on a cardiac monitor.  I personally viewed and interpreted the cardiac monitored which showed an underlying rhythm of: NSR  Reevaluation: After the interventions noted above, I reevaluated the patient and found that they have :improved  Social Determinants of Health: Lives independently  Disposition:  Admit to medicine for obs w/ cards following  Co morbidities that complicate the patient evaluation  Past Medical History:  Diagnosis Date   Allergy    Anxiety    Arthritis    CRPS (complex regional pain syndrome type I)    Depression    DES exposure in utero    SVT (supraventricular tachycardia) (HCC)      Medicines Meds ordered this encounter  Medications   nitroGLYCERIN (NITROSTAT) SL tablet 0.4 mg    I have reviewed the patients home medicines and have made adjustments as needed  Problem List / ED Course: Problem List Items Addressed This Visit   None Visit Diagnoses     Atypical chest pain    -  Primary                   This note was created using dictation software, which may contain spelling or grammatical errors.    Loetta Rough, MD 11/19/23 [id number]

## 2023-11-15 NOTE — H&P (Signed)
History and Physical    Tiffany [name] WGN:[id number] DOB: 03-Aug-1971 DOA: 11/15/2023  PCP: Alda Ponder, FNP  Patient coming from: Home  I have personally briefly reviewed patient's old medical records in [facility]  Chief Complaint: Chest pain  HPI: Tiffany [name] is a 52 y.o. female with medical history significant for history of SVT, depression/anxiety, complex regional pain syndrome who presented to the ED for evaluation of chest pain.  Patient reports 1 month of exertional left-sided chest pain.  Pain is described as a heavy/pressure-like sensation.  Occasionally radiates across her chest, upper neck and down both arms.  During his episodes she will break out in sweats and have some nausea.  She has had palpitations associated with this.    She says it feels similar to when she has episodes of SVT but has not had any objective findings of SVT during these recent episodes.  She says she was first diagnosed with SVT when she was in her 34s.  Chest pain has been increasing in frequency from a few times per week now every day this last week.  Today she developed pain while at rest.  She was seen by her PCP on 11/18 and was referred to cardiology but has not been scheduled for an appointment yet.    She denies any alcohol use.  She reports vaping.  She reports a history of CAD and SVT in her mother and multiple other family members.  She says she has had relief in her chest discomfort after receiving sublingual nitroglycerin while in the ED.  ED Course  Labs/Imaging on admission: I have personally reviewed following labs and imaging studies.  Initial vitals showed BP 133/95, pulse 89, RR 19, temp 98.2 F, SpO2 98% on room air.  Labs show sodium 139, potassium 3.6, bicarb 23, BUN 10, creatinine 0.92, serum glucose 110, WBC 8.4, hemoglobin 14.3, platelets 244,000, troponin 2 > 3, D-dimer 0.43.  2 view chest x-ray negative for focal consolidation, edema, effusion.  Patient  given sublingual nitroglycerin.  EDP discussed with on-call cardiology Dr. Laurelyn Sickle who recommended medical admission.  Hospitalist service was consulted to admit.  Review of Systems: All systems reviewed and are negative except as documented in history of present illness above.   Past Medical History:  Diagnosis Date   Allergy    Anxiety    Arthritis    CRPS (complex regional pain syndrome type I)    Depression    DES exposure in utero    SVT (supraventricular tachycardia) (HCC)     Past Surgical History:  Procedure Laterality Date   CESAREAN SECTION     x2   CHOLECYSTECTOMY     LAPAROSCOPIC APPENDECTOMY N/A 12/08/2021   Procedure: APPENDECTOMY LAPAROSCOPIC;  Surgeon: Diamantina Monks, MD;  Location: [facility] OR;  Service: General;  Laterality: N/A;   SALPINGOSTOMY     due to ectopic pregnancy    Social History:  reports that she has quit smoking. Her smoking use included cigarettes. She has a 7.5 pack-year smoking history. She has never used smokeless tobacco. She reports current alcohol use. She reports that she does not use drugs.  Allergies  Allergen Reactions   Contrast Media [Iodinated Contrast Media] Shortness Of Breath   Stadol [Butorphanol] Shortness Of Breath   Other     Beta blockers causes wheezing      Family History  Problem Relation Age of Onset   Heart disease Mother    Hyperlipidemia Mother  Hypertension Mother    Mental illness Mother    COPD Mother    Mental illness Father        bipolar   Hyperlipidemia Sister    Hypertension Sister    Supraventricular tachycardia Sister    Hypertension Brother    Cancer Maternal Grandmother    Heart disease Maternal Grandmother    Hyperlipidemia Maternal Grandmother    Hypertension Maternal Grandmother    Stroke Maternal Grandmother    Supraventricular tachycardia Sister    Arthritis Daughter        JRA   Arthritis Daughter        JRA     Prior to Admission medications   Medication Sig Start Date End  Date Taking? Authorizing Provider  ALPRAZolam Prudy Feeler) 0.5 MG tablet Take 1 tablet (0.5 mg total) by mouth daily as needed for anxiety. Patient taking differently: Take 0.5 mg by mouth See admin instructions. 0.5mg  every night And 0.5mg  daily as needed for panic 10/03/16  Yes English, Stephanie D, PA  diltiazem (CARDIZEM LA) 120 MG 24 hr tablet Take 120 mg by mouth daily. 09/24/22  Yes [provider]  DULoxetine (CYMBALTA) 60 MG capsule Take 1 capsule (60 mg total) by mouth daily. 10/03/16  Yes English, Judeth Cornfield D, PA  losartan (COZAAR) 25 MG tablet Take 25 mg by mouth daily.   Yes [provider]  Semaglutide,0.25 or 0.5MG /DOS, (OZEMPIC, 0.25 OR 0.5 MG/DOSE,) 2 MG/3ML SOPN 0.25 mg x4 weeks; then 0.5 mg x 4 weeks then 1 mg weekly thereafter 09/21/23  Yes [provider]  albuterol (PROVENTIL) (2.5 MG/3ML) 0.083% nebulizer solution Take 3 mLs (2.5 mg total) by nebulization every 6 (six) hours as needed for wheezing or shortness of breath. 04/11/22   Mancel Bale, MD  ibuprofen (ADVIL) 200 MG tablet Take 3 tablets (600 mg total) by mouth 4 (four) times daily. 12/08/21   Diamantina Monks, MD  methocarbamol (ROBAXIN) 750 MG tablet Take 1 tablet (750 mg total) by mouth 3 (three) times daily as needed (muscle spasm/pain). 05/30/23   Cathren Laine, MD  predniSONE (DELTASONE) 50 MG tablet Take 1 pill 13 hours prior to scan. Then take 1 pill 7 hours prior. Then take 1 pill 1 hour prior 06/14/23   Shanon Ace, PA-C    Physical Exam: Vitals:   11/15/23 1705 11/15/23 1705 11/15/23 2132  BP:  (!) 133/95 139/74  Pulse:  89 74  Resp:  19 18  Temp:  98.2 F (36.8 C) 98.3 F (36.8 C)  TempSrc:  Oral Oral  SpO2:  98% 100%  Weight: 113.4 kg    Height: 5\' 3"  (1.6 m)     Constitutional: Resting in bed, NAD, calm, comfortable Eyes: EOMI, lids and conjunctivae normal ENMT: Mucous membranes are moist. Posterior pharynx clear of any exudate or lesions.Normal dentition.  Neck:  normal, supple, no masses. Respiratory: clear to auscultation bilaterally, no wheezing, no crackles. Normal respiratory effort. No accessory muscle use.  Cardiovascular: Regular rate and rhythm, no murmurs / rubs / gallops. No extremity edema. 2+ pedal pulses. Abdomen: no tenderness, no masses palpated.  Musculoskeletal: no clubbing / cyanosis. No joint deformity upper and lower extremities. Good ROM, no contractures. Normal muscle tone.  Skin: no rashes, lesions, ulcers. No induration Neurologic: Sensation intact. Strength 5/5 in all 4.  Psychiatric: Normal judgment and insight. Alert and oriented x 3. Normal mood.   EKG: Personally reviewed.  Sinus rhythm, rate 65, low voltage, RSR V2.  Rate is slower  when compared to previous.  Assessment/Plan Principal Problem:   Chest pain Active Problems:   History of supraventricular tachycardia   Depression with anxiety   Tiffany [name] is a 52 y.o. female with medical history significant for paroxysmal SVT, depression/anxiety, complex regional pain syndrome who was admitted for chest pain evaluation.  Assessment and Plan: Chest pain: Presenting with left-sided mostly exertional chest pain.  Troponin negative x 2.  EKG without significant acute ischemic changes.  Has some improvement with sublingual NTG.  EDP spoke with cardiology who recommended overnight observation. -Keep on telemetry -Obtain echocardiogram -Continue aspirin 81 mg daily -Continue SL NTG as needed  History of SVT: In sinus rhythm with controlled rate on admission.  Continue diltiazem.  Depression/anxiety: Continue Cymbalta and Xanax.   DVT prophylaxis: enoxaparin (LOVENOX) injection 40 mg Start: 11/16/23 2200 Code Status: Full code, confirmed with patient on admission Family Communication: Husband and daughter at bedside Disposition Plan: From home and likely discharge to home pending clinical progress Consults called: EDP discussed with cardiology Severity of  Illness: The appropriate patient status for this patient is OBSERVATION. Observation status is judged to be reasonable and necessary in order to provide the required intensity of service to ensure the patient's safety. The patient's presenting symptoms, physical exam findings, and initial radiographic and laboratory data in the context of their medical condition is felt to place them at decreased risk for further clinical deterioration. Furthermore, it is anticipated that the patient will be medically stable for discharge from the hospital within 2 midnights of admission.   Darreld Mclean MD [facility]  If 7PM-7AM, please contact night-coverage [email or web address]  11/16/2023, 12:19 AM

## 2023-11-15 NOTE — Hospital Course (Signed)
Tiffany [name] is a 52 y.o. female with medical history significant for paroxysmal SVT, depression/anxiety, complex regional pain syndrome who was admitted for chest pain evaluation.

## 2023-11-16 ENCOUNTER — Ambulatory Visit (HOSPITAL_BASED_OUTPATIENT_CLINIC_OR_DEPARTMENT_OTHER): Payer: BC Managed Care – PPO

## 2023-11-16 DIAGNOSIS — Z8679 Personal history of other diseases of the circulatory system: Secondary | ICD-10-CM

## 2023-11-16 DIAGNOSIS — I309 Acute pericarditis, unspecified: Secondary | ICD-10-CM

## 2023-11-16 DIAGNOSIS — F418 Other specified anxiety disorders: Secondary | ICD-10-CM | POA: Diagnosis not present

## 2023-11-16 DIAGNOSIS — R079 Chest pain, unspecified: Secondary | ICD-10-CM

## 2023-11-16 LAB — ECHOCARDIOGRAM COMPLETE
AR max vel: 2.57 cm2
AV Area VTI: 2.43 cm2
AV Area mean vel: 2.52 cm2
AV Mean grad: 4 mm[Hg]
AV Peak grad: 7.6 mm[Hg]
Ao pk vel: 1.38 m/s
Area-P 1/2: 3.27 cm2
Height: 63 in
S' Lateral: 2.8 cm
Weight: 4072 [oz_av]

## 2023-11-16 LAB — BASIC METABOLIC PANEL
Anion gap: 9 (ref 5–15)
BUN: 11 mg/dL (ref 6–20)
CO2: 25 mmol/L (ref 22–32)
Calcium: 8.9 mg/dL (ref 8.9–10.3)
Chloride: 105 mmol/L (ref 98–111)
Creatinine, Ser: 0.9 mg/dL (ref 0.44–1.00)
GFR, Estimated: >60 mL/min (ref >60)
Glucose, Bld: 113 mg/dL — ABNORMAL HIGH (ref 70–99)
Potassium: 3.4 mmol/L — ABNORMAL LOW (ref 3.5–5.1)
Sodium: 139 mmol/L (ref 135–145)

## 2023-11-16 LAB — LIPID PANEL
Cholesterol: 164 mg/dL (ref 0–200)
HDL: 41 mg/dL (ref >40)
LDL Cholesterol: 110 mg/dL — ABNORMAL HIGH (ref 0–99)
Total CHOL/HDL Ratio: 4 {ratio}
Triglycerides: 67 mg/dL (ref <150)
VLDL: 13 mg/dL (ref 0–40)

## 2023-11-16 LAB — CBC
HCT: 41.2 % (ref 36.0–46.0)
Hemoglobin: 13.7 g/dL (ref 12.0–15.0)
MCH: 30.2 pg (ref 26.0–34.0)
MCHC: 33.3 g/dL (ref 30.0–36.0)
MCV: 90.9 fL (ref 80.0–100.0)
Platelets: 232 10*3/uL (ref 150–400)
RBC: 4.53 MIL/uL (ref 3.87–5.11)
RDW: 12.7 % (ref 11.5–15.5)
WBC: 7.6 10*3/uL (ref 4.0–10.5)
nRBC: 0 % (ref 0.0–0.2)

## 2023-11-16 LAB — C-REACTIVE PROTEIN: CRP: 1.2 mg/dL — ABNORMAL HIGH (ref <1.0)

## 2023-11-16 LAB — SEDIMENTATION RATE: Sed Rate: 11 mm/h (ref 0–22)

## 2023-11-16 MED ORDER — ALPRAZOLAM 0.5 MG PO TABS: 0.5000 mg | ORAL_TABLET | Freq: Every day | ORAL | Status: DC | PRN

## 2023-11-16 MED ORDER — ENOXAPARIN SODIUM 40 MG/0.4ML IJ SOSY: 40.0000 mg | PREFILLED_SYRINGE | INTRAMUSCULAR | Status: DC

## 2023-11-16 MED ORDER — ONDANSETRON HCL 4 MG/2ML IJ SOLN: 4.0000 mg | Freq: Four times a day (QID) | INTRAMUSCULAR | Status: DC | PRN

## 2023-11-16 MED ORDER — ALPRAZOLAM 0.5 MG PO TABS
0.5000 mg | ORAL_TABLET | Freq: Every day | ORAL | Status: DC
  Administered 2023-11-16: 0.5 mg via ORAL
  Filled 2023-11-16: qty 1

## 2023-11-16 MED ORDER — ONDANSETRON HCL 4 MG PO TABS: 4.0000 mg | ORAL_TABLET | Freq: Four times a day (QID) | ORAL | Status: DC | PRN

## 2023-11-16 MED ORDER — SENNOSIDES-DOCUSATE SODIUM 8.6-50 MG PO TABS
1.0000 | ORAL_TABLET | Freq: Every evening | ORAL | Status: DC | PRN
Start: 2023-11-16 — End: 2023-11-17

## 2023-11-16 MED ORDER — DILTIAZEM HCL ER COATED BEADS 120 MG PO CP24
120.0000 mg | ORAL_CAPSULE | Freq: Every day | ORAL | Status: DC
  Administered 2023-11-16: 120 mg via ORAL
  Filled 2023-11-16: qty 1

## 2023-11-16 MED ORDER — COLCHICINE 0.6 MG PO TABS: 0.6000 mg | ORAL_TABLET | Freq: Two times a day (BID) | ORAL | 0 refills | Status: DC

## 2023-11-16 MED ORDER — ALPRAZOLAM 0.25 MG PO TABS: 0.5000 mg | ORAL_TABLET | ORAL | Status: DC

## 2023-11-16 MED ORDER — ACETAMINOPHEN 650 MG RE SUPP: 650.0000 mg | Freq: Four times a day (QID) | RECTAL | Status: DC | PRN

## 2023-11-16 MED ORDER — ASPIRIN 81 MG PO CHEW
81.0000 mg | CHEWABLE_TABLET | Freq: Every day | ORAL | Status: DC
  Administered 2023-11-16: 81 mg via ORAL
  Filled 2023-11-16: qty 1

## 2023-11-16 MED ORDER — SODIUM CHLORIDE 0.9% FLUSH
3.0000 mL | Freq: Two times a day (BID) | INTRAVENOUS | Status: DC
  Administered 2023-11-16 (×2): 3 mL via INTRAVENOUS

## 2023-11-16 MED ORDER — DULOXETINE HCL 60 MG PO CPEP
60.0000 mg | ORAL_CAPSULE | Freq: Every day | ORAL | Status: DC
  Administered 2023-11-16: 60 mg via ORAL
  Filled 2023-11-16: qty 1

## 2023-11-16 MED ORDER — ACETAMINOPHEN 325 MG PO TABS
650.0000 mg | ORAL_TABLET | Freq: Four times a day (QID) | ORAL | Status: DC | PRN
  Administered 2023-11-16: 650 mg via ORAL
  Filled 2023-11-16: qty 2

## 2023-11-16 MED ORDER — NITROGLYCERIN 0.4 MG SL SUBL: 0.4000 mg | SUBLINGUAL_TABLET | SUBLINGUAL | 0 refills | Status: DC | PRN

## 2023-11-16 NOTE — Progress Notes (Deleted)
PROGRESS NOTE    Tiffany [name]  XBJ:[id number] DOB: [birth year] DOA: 11/15/2023 PCP: Alda Ponder, FNP   Brief Narrative:  Tiffany [name] is a 52 y.o. female with medical history significant for history of SVT, depression/anxiety, complex regional pain syndrome who presented to the ED for evaluation of chest pain.  Assessment & Plan:   Principal Problem:   Chest pain Active Problems:   History of supraventricular tachycardia   Depression with anxiety   Atypical chest pain, subacute  -Notable and worsening over the past 2 months after recent upper respiratory tract illness -Cardiology following, appreciate insight recommendations -EKG and troponin reassuring for ACS rule out -Possible pericarditis given atypical symptoms, CRP, ESR and echo pending -consider CT coronary if these come back negative  Hypertension  History of SVT, rate controlled -Chronically on diltiazem, continue -Continue losartan  Depression/anxiety -Continue home medications occluding alprazolam, duloxetine  Nicotine use/abuse -Patient admits to ongoing vaping, lengthy discussion about cessation with cardiology given patient's above history and risk factors  DVT prophylaxis: enoxaparin (LOVENOX) injection 40 mg Start: 11/16/23 2200   Code Status:   Code Status: Full Code  Family Communication: At bedside  Status is: Inpatient  Dispo: The patient is from: Home              Anticipated d/c is to: Home              Anticipated d/c date is: 24 to 48 hours              Patient currently not medically stable for discharge given ongoing need for further evaluation and treatment  Consultants:  Cardiology  Procedures:  None  Antimicrobials:  None indicated  Subjective: No acute issues or events overnight, chest pain essentially unchanged since admission.  No improvement with current regimen although patient remarks on transient improvement with nitro -otherwise denies nausea vomiting diarrhea  constipation headache fevers chills or shortness of breath  Objective: Vitals:   11/16/23 0210 11/16/23 0550 11/16/23 0745 11/16/23 1225  BP:  119/61 (!) 126/57 (!) 145/72  Pulse:  60 60 (!) 59  Resp:  16 16 16   Temp:  98 F (36.7 C) 97.8 F (36.6 C) 97.8 F (36.6 C)  TempSrc:  Oral Oral Oral  SpO2:  97% 98% 98%  Weight: 115.4 kg     Height: 5\' 3"  (1.6 m)       Intake/Output Summary (Last 24 hours) at 11/16/2023 1559 Last data filed at 11/16/2023 0600 Gross per 24 hour  Intake 300 ml  Output --  Net 300 ml   Filed Weights   11/15/23 1705 11/16/23 0210  Weight: 113.4 kg 115.4 kg    Examination:  General:  Pleasantly resting in bed, No acute distress. HEENT:  Normocephalic atraumatic.  Sclerae nonicteric, noninjected.  Extraocular movements intact bilaterally. Neck:  Without mass or deformity.  Trachea is midline. Lungs:  Clear to auscultate bilaterally without rhonchi, wheeze, or rales.  Pain somewhat reproducible with midsternal pressure and deep inhalation Heart:  Regular rate and rhythm.  Without murmurs, rubs, or gallops. Abdomen:  Soft, nontender, nondistended.  Without guarding or rebound. Extremities: Without cyanosis, clubbing, edema, or obvious deformity. Skin:  Warm and dry, no erythema.   Data Reviewed: I have personally reviewed following labs and imaging studies  CBC: Recent Labs  Lab 11/15/23 1709 11/16/23 0538  WBC 8.4 7.6  HGB 14.3 13.7  HCT 42.4 41.2  MCV 91.0 90.9  PLT 244 232   Basic  Metabolic Panel: Recent Labs  Lab 11/15/23 1709 11/16/23 0538  NA 139 139  K 3.6 3.4*  CL 106 105  CO2 23 25  GLUCOSE 110* 113*  BUN 10 11  CREATININE 0.92 0.90  CALCIUM 9.0 8.9   GFR: Estimated Creatinine Clearance: 89.6 mL/min (by C-G formula based on SCr of 0.9 mg/dL).  Lipid Profile: Recent Labs    11/16/23 1300  CHOL 164  HDL 41  LDLCALC 110*  TRIG 67  CHOLHDL 4.0   No results found for this or any previous visit (from the past 240  hour(s)).   Radiology Studies: DG Chest 2 View  Result Date: 11/15/2023 CLINICAL DATA:  Chest pain for several months. EXAM: CHEST - 2 VIEW COMPARISON:  September 14, 2023. FINDINGS: The heart size and mediastinal contours are within normal limits. Both lungs are clear. The visualized skeletal structures are unremarkable. IMPRESSION: No active cardiopulmonary disease. Electronically Signed   By: Lupita Raider M.D.   On: 11/15/2023 18:29    Scheduled Meds:  ALPRAZolam  0.5 mg Oral QHS   aspirin  81 mg Oral Daily   diltiazem  120 mg Oral Daily   DULoxetine  60 mg Oral Daily   enoxaparin (LOVENOX) injection  40 mg Subcutaneous Q24H   sodium chloride flush  3 mL Intravenous Q12H   Continuous Infusions:   LOS: 0 days   Time spent:  [name], DO [facility]  If 7PM-7AM, please contact night-coverage [email or web address]  11/16/2023, 3:59 PM

## 2023-11-16 NOTE — ED Notes (Signed)
ED TO INPATIENT HANDOFF REPORT  ED Nurse Name and Phone #:  Corliss Blacker, RN [phone]  S Name/Age/Gender Tiffany [name] 52 y.o. female Room/Bed: 003C/003C  Code Status   Code Status: Prior  Home/SNF/Other Home Patient oriented to: self, place, time, and situation Is this baseline? Yes   Triage Complete: Triage complete  Chief Complaint Chest pain [R07.9]  Triage Note Pt came in via POV d/t central CP the last few months & has been treated for angina by her PCP. Does radiate into both shoulders & back. Reports here to ED d/t worsening pain & SOB & her PCP reporting their EKG showing probable LVH & was given a STAT referral to Cardiologist & was told if it happened again or worsens to go to ED. A/Ox4, rates her CP 8/10.   Allergies Allergies  Allergen Reactions   Contrast Media [Iodinated Contrast Media] Shortness Of Breath   Stadol [Butorphanol] Shortness Of Breath   Other     Beta blockers causes wheezing      Level of Care/Admitting Diagnosis ED Disposition     ED Disposition  Admit   Condition  --   Comment  Hospital Area: [facility] [[id number]]  Level of Care: Telemetry Cardiac [[id number]]  May place patient in observation at [facility] or [facility] if equivalent level of care is available:: No  Covid Evaluation: Asymptomatic - no recent exposure (last 10 days) testing not required  Diagnosis: Chest pain [744799]  Admitting Physician: Charlsie Quest [[id number]]  Attending Physician: Charlsie Quest [[id number]]          B Medical/Surgery History Past Medical History:  Diagnosis Date   Allergy    Anxiety    Arthritis    CRPS (complex regional pain syndrome type I)    Depression    DES exposure in utero    SVT (supraventricular tachycardia) (HCC)    Past Surgical History:  Procedure Laterality Date   CESAREAN SECTION     x2   CHOLECYSTECTOMY     LAPAROSCOPIC APPENDECTOMY N/A 12/08/2021   Procedure: APPENDECTOMY LAPAROSCOPIC;  Surgeon: Diamantina Monks, MD;  Location: [facility] OR;  Service: General;  Laterality: N/A;   SALPINGOSTOMY     due to ectopic pregnancy     A IV Location/Drains/Wounds Patient Lines/Drains/Airways Status     Active Line/Drains/Airways     Name Placement date Placement time Site Days   Peripheral IV 06/18/23 20 G 1" Right Antecubital 06/18/23  0734  Antecubital  151   Peripheral IV 11/15/23 20 G Anterior;Proximal;Right Forearm 11/15/23  2246  Forearm  1            Intake/Output Last 24 hours No intake or output data in the 24 hours ending 11/16/23 0003  Labs/Imaging Results for orders placed or performed during the hospital encounter of 11/15/23 (from the past 48 hour(s))  Basic metabolic panel     Status: Abnormal   Collection Time: 11/15/23  5:09 PM  Result Value Ref Range   Sodium 139 135 - 145 mmol/L   Potassium 3.6 3.5 - 5.1 mmol/L   Chloride 106 98 - 111 mmol/L   CO2 23 22 - 32 mmol/L   Glucose, Bld 110 (H) 70 - 99 mg/dL    Comment: Glucose reference range applies only to samples taken after fasting for at least 8 hours.   BUN 10 6 - 20 mg/dL   Creatinine, Ser 3.08 0.44 - 1.00 mg/dL   Calcium 9.0 8.9 -  10.3 mg/dL   GFR, Estimated >16 >10 mL/min    Comment: (NOTE) Calculated using the CKD-EPI Creatinine Equation (2021)    Anion gap 10 5 - 15    Comment: Performed at [facility], [address] [place], [place] [address]  CBC     Status: None   Collection Time: 11/15/23  5:09 PM  Result Value Ref Range   WBC 8.4 4.0 - 10.5 K/uL   RBC 4.66 3.87 - 5.11 MIL/uL   Hemoglobin 14.3 12.0 - 15.0 g/dL   HCT 40.9 81.1 - 91.4 %   MCV 91.0 80.0 - 100.0 fL   MCH 30.7 26.0 - 34.0 pg   MCHC 33.7 30.0 - 36.0 g/dL   RDW 78.2 95.6 - 21.3 %   Platelets 244 150 - 400 K/uL   nRBC 0.0 0.0 - 0.2 %    Comment: Performed at [facility], [address], [place], [place] [address]  Troponin I (High Sensitivity)     Status: None   Collection Time: 11/15/23  5:09 PM  Result Value Ref Range    Troponin I (High Sensitivity) 2 <18 ng/L    Comment: (NOTE) Elevated high sensitivity troponin I (hsTnI) values and significant  changes across serial measurements may suggest ACS but many other  chronic and acute conditions are known to elevate hsTnI results.  Refer to the "Links" section for chest pain algorithms and additional  guidance. Performed at [facility], [address], [place], [place] [address]   Troponin I (High Sensitivity)     Status: None   Collection Time: 11/15/23  7:42 PM  Result Value Ref Range   Troponin I (High Sensitivity) 3 <18 ng/L    Comment: (NOTE) Elevated high sensitivity troponin I (hsTnI) values and significant  changes across serial measurements may suggest ACS but many other  chronic and acute conditions are known to elevate hsTnI results.  Refer to the "Links" section for chest pain algorithms and additional  guidance. Performed at [facility], [address], [place], [place] [address]   D-dimer, quantitative     Status: None   Collection Time: 11/15/23 10:45 PM  Result Value Ref Range   D-Dimer, Quant 0.43 0.00 - 0.50 ug/mL-FEU    Comment: (NOTE) At the manufacturer cut-off value of 0.5 g/mL FEU, this assay has a negative predictive value of 95-100%.This assay is intended for use in conjunction with a clinical pretest probability (PTP) assessment model to exclude pulmonary embolism (PE) and deep venous thrombosis (DVT) in outpatients suspected of PE or DVT. Results should be correlated with clinical presentation. Performed at [facility], [address], [place], [place] [address]    DG Chest 2 View  Result Date: 11/15/2023 CLINICAL DATA:  Chest pain for several months. EXAM: CHEST - 2 VIEW COMPARISON:  September 14, 2023. FINDINGS: The heart size and mediastinal contours are within normal limits. Both lungs are clear. The visualized skeletal structures are unremarkable. IMPRESSION: No active cardiopulmonary  disease. Electronically Signed   By: Lupita Raider M.D.   On: 11/15/2023 18:29    Pending Labs Unresulted Labs (From admission, onward)    None       Vitals/Pain Today's Vitals   11/15/23 1705 11/15/23 1705 11/15/23 2132  BP:  (!) 133/95 139/74  Pulse:  89 74  Resp:  19 18  Temp:  98.2 F (36.8 C) 98.3 F (36.8 C)  TempSrc:  Oral Oral  SpO2:  98% 100%  Weight: 113.4 kg    Height: 5\' 3"  (1.6 m)    PainSc: 10-Worst pain ever      Isolation Precautions No active isolations  Medications Medications  nitroGLYCERIN (NITROSTAT) SL tablet 0.4 mg (0.4 mg Sublingual Given 11/15/23 2323)    Mobility walks     Focused Assessments Cardiac Assessment Handoff:  Cardiac Rhythm: Normal sinus rhythm Lab Results  Component Value Date   CKTOTAL 89 10/04/2022   TROPONINI <0.03 04/25/2018   Lab Results  Component Value Date   DDIMER 0.43 11/15/2023   Does the Patient currently have chest pain? Yes    R Recommendations: See Admitting Provider Note  Report given to:   Additional Notes:

## 2023-11-16 NOTE — Discharge Summary (Signed)
Physician Discharge Summary  Tiffany [name] ZOX:[id number] DOB: 1971-01-30 DOA: 11/15/2023  PCP: Alda Ponder, FNP  Admit date: 11/15/2023 Discharge date: 11/16/2023  Admitted From: Home Disposition:  Home  Recommendations for Outpatient Follow-up:  Follow up with PCP in 1-2 weeks  Discharge Condition:Stable  CODE STATUS:Full  Diet recommendation: Low salt low fat diet    Brief/Interim Summary: Tiffany [name] is a 52 y.o. female with medical history significant for history of SVT, depression/anxiety, complex regional pain syndrome who presented to the ED for evaluation of chest pain.  Patient admitted as above with atypical chest pain - EKG/troponin reassuring against ACS. Given symptoms may be pericardial/pleurisy vs musculoskeletal - echo unremarkable per discussion with cardiology and she is otherwise stable and agreeable for discharge home. Cardiology recommending SL nitro and colchicine in the interim for pain control.  Discharge Diagnoses:  Principal Problem:   Chest pain Active Problems:   History of supraventricular tachycardia   Depression with anxiety    Discharge Instructions   Allergies as of 11/16/2023       Reactions   Contrast Media [iodinated Contrast Media] Shortness Of Breath   Stadol [butorphanol] Shortness Of Breath   Other    Beta blockers causes wheezing         Medication List     TAKE these medications    ALPRAZolam 0.5 MG tablet Commonly known as: XANAX Take 1 tablet (0.5 mg total) by mouth daily as needed for anxiety. What changed:  when to take this additional instructions   aspirin 81 MG chewable tablet Chew 81 mg by mouth daily.   colchicine 0.6 MG tablet Take 1 tablet (0.6 mg total) by mouth 2 (two) times daily for 15 days.   diltiazem 120 MG 24 hr tablet Commonly known as: CARDIZEM LA Take 120 mg by mouth daily.   DULoxetine 60 MG capsule Commonly known as: CYMBALTA Take 1 capsule (60 mg total) by mouth daily.    losartan 25 MG tablet Commonly known as: COZAAR Take 25 mg by mouth daily.   nitroGLYCERIN 0.4 MG SL tablet Commonly known as: NITROSTAT Place 1 tablet (0.4 mg total) under the tongue every 5 (five) minutes as needed for chest pain.   Ozempic (0.25 or 0.5 MG/DOSE) 2 MG/3ML Sopn Generic drug: Semaglutide(0.25 or 0.5MG /DOS) 0.25 mg x4 weeks; then 0.5 mg x 4 weeks then 1 mg weekly thereafter        Allergies  Allergen Reactions   Contrast Media [Iodinated Contrast Media] Shortness Of Breath   Stadol [Butorphanol] Shortness Of Breath   Other     Beta blockers causes wheezing      Consultations: Cardiology  Procedures/Studies: ECHOCARDIOGRAM COMPLETE  Result Date: 11/16/2023    ECHOCARDIOGRAM REPORT   Patient Name:   Tiffany [name] Date of Exam: 11/16/2023 Medical Rec #:  [id number]   Height:       63.0 in Accession #:    [id number]  Weight:       254.5 lb Date of Birth:  [birth year]    BSA:          2.142 m Patient Age:    52 years    BP:           126/57 mmHg Patient Gender: F           HR:           58 bpm. Exam Location:  Inpatient Procedure: 2D Echo, Color Doppler and Cardiac Doppler Indications:  Chest Pain  History:        Patient has no prior history of Echocardiogram examinations.                 Arrythmias:h/o SVT; Signs/Symptoms:Chest Pain.  Sonographer:    Milbert Coulter Referring Phys: [id number] VISHAL R PATEL  Sonographer Comments: Technically difficult study due to poor echo windows. IMPRESSIONS  1. Left ventricular ejection fraction, by estimation, is 60 to 65%. The left ventricle has normal function. Left ventricular endocardial border not optimally defined to evaluate regional wall motion. Left ventricular diastolic parameters were normal.  2. Right ventricular systolic function is normal. The right ventricular size is normal. Tricuspid regurgitation signal is inadequate for assessing PA pressure.  3. The mitral valve is normal in structure. Trivial mitral valve regurgitation.  No evidence of mitral stenosis.  4. The aortic valve is grossly normal. Unable to determine aortic valve morphology due to image quality. Aortic valve regurgitation is trivial. No aortic stenosis is present.  5. The inferior vena cava is normal in size with greater than 50% respiratory variability, suggesting right atrial pressure of 3 mmHg. FINDINGS  Left Ventricle: Left ventricular ejection fraction, by estimation, is 60 to 65%. The left ventricle has normal function. Left ventricular endocardial border not optimally defined to evaluate regional wall motion. The left ventricular internal cavity size was normal in size. There is no left ventricular hypertrophy. Left ventricular diastolic parameters were normal. Right Ventricle: The right ventricular size is normal. No increase in right ventricular wall thickness. Right ventricular systolic function is normal. Tricuspid regurgitation signal is inadequate for assessing PA pressure. Left Atrium: Left atrial size was normal in size. Right Atrium: Right atrial size was normal in size. Pericardium: There is no evidence of pericardial effusion. Mitral Valve: The mitral valve is normal in structure. Trivial mitral valve regurgitation. No evidence of mitral valve stenosis. Tricuspid Valve: The tricuspid valve is normal in structure. Tricuspid valve regurgitation is not demonstrated. No evidence of tricuspid stenosis. Aortic Valve: The aortic valve is grossly normal. Aortic valve regurgitation is trivial. No aortic stenosis is present. Aortic valve mean gradient measures 4.0 mmHg. Aortic valve peak gradient measures 7.6 mmHg. Aortic valve area, by VTI measures 2.43 cm. Pulmonic Valve: The pulmonic valve was normal in structure. Pulmonic valve regurgitation is not visualized. No evidence of pulmonic stenosis. Aorta: The aortic root is normal in size and structure. Venous: The inferior vena cava is normal in size with greater than 50% respiratory variability, suggesting right  atrial pressure of 3 mmHg. IAS/Shunts: No atrial level shunt detected by color flow Doppler.  LEFT VENTRICLE PLAX 2D LVIDd:         5.00 cm   Diastology LVIDs:         2.80 cm   LV e' medial:    8.92 cm/s LV PW:         0.80 cm   LV E/e' medial:  9.9 LV IVS:        1.20 cm   LV e' lateral:   12.00 cm/s LVOT diam:     1.90 cm   LV E/e' lateral: 7.3 LV SV:         81 LV SV Index:   38 LVOT Area:     2.84 cm  RIGHT VENTRICLE RV Basal diam:  3.20 cm RV Mid diam:    2.50 cm RV S prime:     15.90 cm/s TAPSE (M-mode): 2.8 cm LEFT ATRIUM  Index        RIGHT ATRIUM           Index LA diam:        3.60 cm 1.68 cm/m   RA Area:     12.10 cm LA Vol (A2C):   39.7 ml 18.53 ml/m  RA Volume:   27.30 ml  12.74 ml/m LA Vol (A4C):   41.2 ml 19.23 ml/m LA Biplane Vol: 40.9 ml 19.09 ml/m  AORTIC VALVE AV Area (Vmax):    2.57 cm AV Area (Vmean):   2.52 cm AV Area (VTI):     2.43 cm AV Vmax:           138.00 cm/s AV Vmean:          94.100 cm/s AV VTI:            0.335 m AV Peak Grad:      7.6 mmHg AV Mean Grad:      4.0 mmHg LVOT Vmax:         125.00 cm/s LVOT Vmean:        83.800 cm/s LVOT VTI:          0.287 m LVOT/AV VTI ratio: 0.86  AORTA Ao Root diam: 3.00 cm Ao Asc diam:  3.00 cm MITRAL VALVE MV Area (PHT): 3.27 cm    SHUNTS MV Decel Time: 232 msec    Systemic VTI:  0.29 m MV E velocity: 88.00 cm/s  Systemic Diam: 1.90 cm MV A velocity: 88.00 cm/s MV E/A ratio:  1.00 Weston Brass MD Electronically signed by Weston Brass MD Signature Date/Time: 11/16/2023/5:34:59 PM    Final    DG Chest 2 View  Result Date: 11/15/2023 CLINICAL DATA:  Chest pain for several months. EXAM: CHEST - 2 VIEW COMPARISON:  September 14, 2023. FINDINGS: The heart size and mediastinal contours are within normal limits. Both lungs are clear. The visualized skeletal structures are unremarkable. IMPRESSION: No active cardiopulmonary disease. Electronically Signed   By: Lupita Raider M.D.   On: 11/15/2023 18:29     Subjective:  No acute issue/events overnight   Discharge Exam: Vitals:   11/16/23 1225 11/16/23 1707  BP: (!) 145/72 (!) 141/80  Pulse: (!) 59 60  Resp: 16 18  Temp: 97.8 F (36.6 C) 97.8 F (36.6 C)  SpO2: 98% 97%   Vitals:   11/16/23 0550 11/16/23 0745 11/16/23 1225 11/16/23 1707  BP: 119/61 (!) 126/57 (!) 145/72 (!) 141/80  Pulse: 60 60 (!) 59 60  Resp: 16 16 16 18   Temp: 98 F (36.7 C) 97.8 F (36.6 C) 97.8 F (36.6 C) 97.8 F (36.6 C)  TempSrc: Oral Oral Oral Oral  SpO2: 97% 98% 98% 97%  Weight:      Height:        General:  Pleasantly resting in bed, No acute distress. HEENT:  Normocephalic atraumatic.  Sclerae nonicteric, noninjected.  Extraocular movements intact bilaterally. Neck:  Without mass or deformity.  Trachea is midline. Lungs:  Clear to auscultate bilaterally without rhonchi, wheeze, or rales.  Pain somewhat reproducible with midsternal pressure and deep inhalation Heart:  Regular rate and rhythm.  Without murmurs, rubs, or gallops. Abdomen:  Soft, nontender, nondistended.  Without guarding or rebound. Extremities: Without cyanosis, clubbing, edema, or obvious deformity. Skin:  Warm and dry, no erythema.    The results of significant diagnostics from this hospitalization (including imaging, microbiology, ancillary and laboratory) are listed below for reference.     Microbiology: No  results found for this or any previous visit (from the past 240 hour(s)).   Labs: BNP (last 3 results) No results for input(s): "BNP" in the last 8760 hours. Basic Metabolic Panel: Recent Labs  Lab 11/15/23 1709 11/16/23 0538  NA 139 139  K 3.6 3.4*  CL 106 105  CO2 23 25  GLUCOSE 110* 113*  BUN 10 11  CREATININE 0.92 0.90  CALCIUM 9.0 8.9   Liver Function Tests: No results for input(s): "AST", "ALT", "ALKPHOS", "BILITOT", "PROT", "ALBUMIN" in the last 168 hours. No results for input(s): "LIPASE", "AMYLASE" in the last 168 hours. No results for input(s): "AMMONIA" in  the last 168 hours. CBC: Recent Labs  Lab 11/15/23 1709 11/16/23 0538  WBC 8.4 7.6  HGB 14.3 13.7  HCT 42.4 41.2  MCV 91.0 90.9  PLT 244 232   Cardiac Enzymes: No results for input(s): "CKTOTAL", "CKMB", "CKMBINDEX", "TROPONINI" in the last 168 hours. BNP: Invalid input(s): "POCBNP" CBG: No results for input(s): "GLUCAP" in the last 168 hours. D-Dimer Recent Labs    11/15/23 2245  DDIMER 0.43   Hgb A1c No results for input(s): "HGBA1C" in the last 72 hours. Lipid Profile Recent Labs    11/16/23 1300  CHOL 164  HDL 41  LDLCALC 110*  TRIG 67  CHOLHDL 4.0   Thyroid function studies No results for input(s): "TSH", "T4TOTAL", "T3FREE", "THYROIDAB" in the last 72 hours.  Invalid input(s): "FREET3" Anemia work up No results for input(s): "VITAMINB12", "FOLATE", "FERRITIN", "TIBC", "IRON", "RETICCTPCT" in the last 72 hours. Urinalysis    Component Value Date/Time   COLORURINE YELLOW 05/02/2023 0754   APPEARANCEUR HAZY (A) 05/02/2023 0754   LABSPEC 1.018 05/02/2023 0754   PHURINE 5.0 05/02/2023 0754   GLUCOSEU NEGATIVE 05/02/2023 0754   HGBUR MODERATE (A) 05/02/2023 0754   BILIRUBINUR NEGATIVE 05/02/2023 0754   BILIRUBINUR neg 11/10/2013 1415   KETONESUR NEGATIVE 05/02/2023 0754   PROTEINUR NEGATIVE 05/02/2023 0754   UROBILINOGEN 0.2 11/10/2013 1415   NITRITE NEGATIVE 05/02/2023 0754   LEUKOCYTESUR MODERATE (A) 05/02/2023 0754   Sepsis Labs Recent Labs  Lab 11/15/23 1709 11/16/23 0538  WBC 8.4 7.6   Microbiology No results found for this or any previous visit (from the past 240 hour(s)).   Time coordinating discharge: Over 30 minutes  SIGNED:   [name], DO [facility] 11/16/2023, 6:25 PM Pager   If 7PM-7AM, please contact night-coverage [email or web address]

## 2023-11-16 NOTE — Progress Notes (Signed)
Review chart,lab results, and pending test. Her daughter and husband are at bedside while discussing information. Will continue to monitor patient.

## 2023-11-16 NOTE — Care Management (Signed)
  Transition of Care [facility]) Screening Note   Patient Details  Name: DAYLENE WIED Date of Birth: [birth year]   Transition of Care [facility]) CM/SW Contact:    Lockie Pares, RN Phone Number: 11/16/2023, 9:41 AM    Transition of Care Department [facility]) has reviewed patient and no TOC needs have been identified at this time. We will continue to monitor patient advancement through interdisciplinary progression rounds. If new patient transition needs arise, please place a TOC consult.

## 2023-11-16 NOTE — Consult Note (Addendum)
Cardiology Consultation   Patient ID: KAJOL BURDA MRN: [id number]; DOB: Aug 14, 1971  Admit date: 11/15/2023 Date of Consult: 11/16/2023  PCP:  Alda Ponder, FNP   [facility] Providers Cardiologist:  Parke Poisson, MD        Patient Profile:   Tiffany [name] is a 52 y.o. female with a hx of SVT, anxiety, hypertension who is being seen 11/16/2023 for the evaluation of chest pain at the request of Dr. Natale Milch.  History of Present Illness:   Ms. Wiswall was diagnosed with SVT in her 51s and is now maintained on 120 mg Cardizem.  She was evaluated in June 2024 for lymphadenopathy and reports of night sweats x 1 month.  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  CT chest 06/14/2023 negative for lymphadenopathy and no evidence of intrathoracic malignancy.  It did mention aortic atherosclerosis.  She saw her PCP on 11/12/2023 with complaints of chest pain.  She described chest pain that radiated to her shoulders and back and stated it felt like SVT pain but had not had SVT.  Isosorbide has not relieved her chest pain.  She also recently started semaglutide.  She is maintained on aspirin 81 mg, 120 Cardizem, 25 mg losartan.  Her anxiety is managed with Cymbalta and Xanax.  She presented to [facility] ED 11/15/2023 with 1 month of exertional left-sided chest pain.  Chest pain now accompanied by shortness of breath, diaphoresis, and nausea.  She also reports intermittent palpitations. On initial evaluation troponin x 2 negative, EKG nonischemic, D-dimer negative.   She was admitted to medicine service and cardiology consulted.  During my interview she reports having respiratory illness approximately 2 months ago.  Following that illness, she started having intermittent chest pain.  At first, the chest pain was 1-3 times weekly and was relieved when she lay down on her left side.  The chest pain has progressed in intensity and frequency.  She developed 2 episodes of  severe left-sided chest pain that radiated to both shoulders, her back, associated with headache, shortness of breath, diaphoresis, and nausea.  This prompted her presentation to her PCP last week.  PCP did refer to cardiology and advised ER evaluation should she have another episode.  She had another episode yesterday prompting ER evaluation.  Chest pain lasted for approximately an hour with the above associated symptoms.  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  She also stated that yesterday she had significant fatigue and was likely having SVT versus PVCs.  Typically, SVT/PVCs do not cause this level of fatigue.  She does not smoke cigarettes but vapes nicotine and has done so for many years.  She is not diabetic but was told she has prediabetes.  She has a family history of heart disease with her mother having an MI prior to age 25.  Most of the women in her family have been diagnosed with PVCs.   Past Medical History:  Diagnosis Date   Allergy    Anxiety    Arthritis    CRPS (complex regional pain syndrome type I)    Depression    DES exposure in utero    SVT (supraventricular tachycardia) (HCC)     Past Surgical History:  Procedure Laterality Date   CESAREAN SECTION     x2   CHOLECYSTECTOMY     LAPAROSCOPIC APPENDECTOMY N/A 12/08/2021  Procedure: APPENDECTOMY LAPAROSCOPIC;  Surgeon: Diamantina Monks, MD;  Location: [facility] OR;  Service: General;  Laterality: N/A;   SALPINGOSTOMY     due to ectopic pregnancy     Home Medications:  Prior to Admission medications   Medication Sig Start Date End Date Taking? Authorizing Provider  ALPRAZolam Prudy Feeler) 0.5 MG tablet Take 1 tablet (0.5 mg total) by mouth daily as needed for anxiety. Patient taking differently: Take 0.5 mg by mouth See admin instructions. 0.5mg  every night And 0.5mg  daily as needed for panic 10/03/16  Yes English, [place]  D, PA  aspirin 81 MG chewable tablet Chew 81 mg by mouth daily.   Yes [provider]  diltiazem (CARDIZEM LA) 120 MG 24 hr tablet Take 120 mg by mouth daily. 09/24/22  Yes [provider]  DULoxetine (CYMBALTA) 60 MG capsule Take 1 capsule (60 mg total) by mouth daily. 10/03/16  Yes English, Judeth Cornfield D, PA  losartan (COZAAR) 25 MG tablet Take 25 mg by mouth daily.   Yes [provider]  Semaglutide,0.25 or 0.5MG /DOS, (OZEMPIC, 0.25 OR 0.5 MG/DOSE,) 2 MG/3ML SOPN 0.25 mg x4 weeks; then 0.5 mg x 4 weeks then 1 mg weekly thereafter 09/21/23  Yes [provider]    Inpatient Medications: Scheduled Meds:  ALPRAZolam  0.5 mg Oral QHS   aspirin  81 mg Oral Daily   diltiazem  120 mg Oral Daily   DULoxetine  60 mg Oral Daily   enoxaparin (LOVENOX) injection  40 mg Subcutaneous Q24H   sodium chloride flush  3 mL Intravenous Q12H   Continuous Infusions:  PRN Meds: acetaminophen **OR** acetaminophen, ALPRAZolam **AND** ALPRAZolam, nitroGLYCERIN, ondansetron **OR** ondansetron (ZOFRAN) IV, senna-docusate  Allergies:    Allergies  Allergen Reactions   Contrast Media [Iodinated Contrast Media] Shortness Of Breath   Stadol [Butorphanol] Shortness Of Breath   Other     Beta blockers causes wheezing      Social History:   Social History   Socioeconomic History   Marital status: Married    Spouse name: Kennedy Bucker   Number of children: 3   Years of education: Not on file   Highest education level: Not on file  Occupational History   Occupation: CNA    Comment: [employer]  Tobacco Use   Smoking status: Former    Current packs/day: 0.50    Average packs/day: 0.5 packs/day for 15.0 years (7.5 ttl pk-yrs)    Types: Cigarettes   Smokeless tobacco: Never   Tobacco comments:    quit in May 2019  Vaping Use   Vaping status: Every Day   Substances: Nicotine, Flavoring  Substance and Sexual Activity   Alcohol use: Yes    Comment: rarely   Drug use:  No    Types: MDMA (Ecstacy)   Sexual activity: Yes    Partners: Female    Birth control/protection: Surgical  Other Topics Concern   Not on file  Social History Narrative   Lives with her husband, and their 3 children.   Social Determinants of Health   Financial Resource Strain: Low Risk  (11/12/2023)   Received from [facility]   Overall Financial Resource Strain (CARDIA)    Difficulty of Paying Living Expenses: Not hard at all  Food Insecurity: No Food Insecurity (11/16/2023)   Hunger Vital Sign    Worried About Running Out of Food in the Last Year: Never true    Ran Out of Food in the Last Year: Never true  Transportation Needs:  No Transportation Needs (11/16/2023)   PRAPARE - Administrator, Civil Service (Medical): No    Lack of Transportation (Non-Medical): No  Physical Activity: Unknown (11/12/2023)   [facility]   Exercise Vital Sign    Days of Exercise per Week: 0 days    Minutes of Exercise per Session: Not on file  Stress: No Stress Concern Present (11/12/2023)   Received from [facility] - Occupational Stress Questionnaire    Feeling of Stress : Not at all  Social Connections: Socially Integrated (11/12/2023)   [facility]   Social Network    How would you rate your social network (family, work, friends)?: Good participation with social networks  Intimate Partner Violence: Not At Risk (11/16/2023)   Humiliation, Afraid, Rape, and Kick questionnaire    Fear of Current or Ex-Partner: No    Emotionally Abused: No    Physically Abused: No    Sexually Abused: No    Family History:    Family History  Problem Relation Age of Onset   Heart disease Mother    Hyperlipidemia Mother    Hypertension Mother    Mental illness Mother    COPD Mother    Mental illness Father        bipolar   Hyperlipidemia Sister    Hypertension Sister    Supraventricular tachycardia Sister     Hypertension Brother    Cancer Maternal Grandmother    Heart disease Maternal Grandmother    Hyperlipidemia Maternal Grandmother    Hypertension Maternal Grandmother    Stroke Maternal Grandmother    Supraventricular tachycardia Sister    Arthritis Daughter        JRA   Arthritis Daughter        JRA     ROS:  Please see the history of present illness.   All other ROS reviewed and negative.     Physical Exam/Data:   Vitals:   11/16/23 0210 11/16/23 0550 11/16/23 0745 11/16/23 1225  BP:  119/61 (!) 126/57 (!) 145/72  Pulse:  60 60 (!) 59  Resp:  16 16 16   Temp:  98 F (36.7 C) 97.8 F (36.6 C) 97.8 F (36.6 C)  TempSrc:  Oral Oral Oral  SpO2:  97% 98% 98%  Weight: 115.4 kg     Height: 5\' 3"  (1.6 m)       Intake/Output Summary (Last 24 hours) at 11/16/2023 1435 Last data filed at 11/16/2023 0600 Gross per 24 hour  Intake 300 ml  Output --  Net 300 ml      11/16/2023    2:10 AM 11/15/2023    5:05 PM 09/14/2023   11:24 AM  Last 3 Weights  Weight (lbs) 254 lb 8 oz 250 lb 247 lb  Weight (kg) 115.44 kg 113.399 kg 112.038 kg     Body mass index is 45.08 kg/m.  General:  Well nourished, well developed, in no acute distress HEENT: normal Neck: no JVD Vascular: No carotid bruits; Distal pulses 2+ bilaterally Cardiac:  normal S1, S2; RRR; no murmur  Lungs:  clear to auscultation bilaterally, no wheezing, rhonchi or rales  Abd: soft, nontender, no hepatomegaly  Ext: no edema Musculoskeletal:  No deformities, BUE and BLE strength normal and equal Skin: warm and dry  Neuro:  CNs 2-12 intact, no focal abnormalities noted Psych:  Normal affect   EKG:  The EKG was personally reviewed and demonstrates:  sinus  rhythm with HR 65 Telemetry:  Telemetry was personally reviewed and demonstrates:  sinus rhythm with HR 70s  Relevant CV Studies:  Echo pending  Laboratory Data:  High Sensitivity Troponin:   Recent Labs  Lab 11/15/23 1709 11/15/23 1942  TROPONINIHS 2 3      Chemistry Recent Labs  Lab 11/15/23 1709 11/16/23 0538  NA 139 139  K 3.6 3.4*  CL 106 105  CO2 23 25  GLUCOSE 110* 113*  BUN 10 11  CREATININE 0.92 0.90  CALCIUM 9.0 8.9  GFRNONAA >60 >60  ANIONGAP 10 9    No results for input(s): "PROT", "ALBUMIN", "AST", "ALT", "ALKPHOS", "BILITOT" in the last 168 hours. Lipids No results for input(s): "CHOL", "TRIG", "HDL", "LABVLDL", "LDLCALC", "CHOLHDL" in the last 168 hours.  Hematology Recent Labs  Lab 11/15/23 1709 11/16/23 0538  WBC 8.4 7.6  RBC 4.66 4.53  HGB 14.3 13.7  HCT 42.4 41.2  MCV 91.0 90.9  MCH 30.7 30.2  MCHC 33.7 33.3  RDW 12.7 12.7  PLT 244 232   Thyroid No results for input(s): "TSH", "FREET4" in the last 168 hours.  BNPNo results for input(s): "BNP", "PROBNP" in the last 168 hours.  DDimer  Recent Labs  Lab 11/15/23 2245  DDIMER 0.43     Radiology/Studies:  DG Chest 2 View  Result Date: 11/15/2023 CLINICAL DATA:  Chest pain for several months. EXAM: CHEST - 2 VIEW COMPARISON:  September 14, 2023. FINDINGS: The heart size and mediastinal contours are within normal limits. Both lungs are clear. The visualized skeletal structures are unremarkable. IMPRESSION: No active cardiopulmonary disease. Electronically Signed   By: Lupita Raider M.D.   On: 11/15/2023 18:29     Assessment and Plan:   Chest pain Aortic atherosclerosis on CT chest 05/2023 - hs troponin x 2 negative - EKG does not appear ischemic -Chest pain with typical and atypical features - Chest pain started after an URI 2 months ago - Chest pain is concerning due to exertional nature, but is described as positional and pleuritic as well -We will collect CRP and sed rate to evaluate for pericarditis - Echocardiogram pending - If echocardiogram is largely normal, can consider CT coronary -If echocardiogram has reduced LVEF or wall motion abnormality, will need to consider definitive angiography   Paroxysmal SVT -- Maintained on 120  mg Cardizem - Telemetry does not show SVT this admission   Hypertension - Maintained on 25 mg losartan, 120 mg Cardizem   Vaping - Encouraged to quit given aortic atherosclerosis and history of PVCs     Risk Assessment/Risk Scores:        For questions or updates, please contact  [facility] Please consult [email or web address] for contact info under    Signed, Marcelino Duster, [place]  11/16/2023 2:35 PM  Patient seen and examined with Bettina Gavia PA.  Agree as above, with the following exceptions and changes as noted below. Pt currently chest pain free. Reviewed echocardiogram which is grossly normal with no pericardial effusion. Crp borderline elevated but sed rate normal. Chest pain reproducible with echo probe, likely MSK. Gen: NAD, CV: RRR, no murmurs, Lungs: clear, Abd: soft, Extrem: Warm, well perfused, no edema, Neuro/Psych: alert and oriented x 3, normal mood and affect. All available labs, radiology testing, previous records reviewed.  If inflammation is secondary to pericardial pain, can give short course of colchine to alleviate pain. Pt feels pain is nitroglycerin responsive, she is agreeable to nitroglycerin PRN as this relieved CP  before. Will arrange f/u and CCTA as outpatient. Stable for hospital discharge from CV perspective.  Parke Poisson, MD 11/16/23 9:31 PM

## 2023-11-19 ENCOUNTER — Other Ambulatory Visit: Payer: Self-pay | Admitting: Physician Assistant

## 2023-11-19 DIAGNOSIS — R072 Precordial pain: Secondary | ICD-10-CM

## 2023-11-19 MED ORDER — PREDNISONE 50 MG PO TABS: ORAL_TABLET | ORAL | 0 refills | Status: DC

## 2023-12-03 ENCOUNTER — Encounter (HOSPITAL_COMMUNITY): Payer: Self-pay

## 2023-12-03 ENCOUNTER — Telehealth (HOSPITAL_COMMUNITY): Payer: Self-pay | Admitting: *Deleted

## 2023-12-03 NOTE — Telephone Encounter (Signed)
Attempted to call patient regarding upcoming cardiac CT appointment. Left message on voicemail with name and callback number  Larey Brick RN Navigator Cardiac Imaging [facility] [phone] Office [phone] Cell  Also left message on spouse's voicemail.

## 2023-12-03 NOTE — Telephone Encounter (Signed)
Reaching out to patient to offer assistance regarding upcoming cardiac imaging study; pt verbalizes understanding of appt date/time, parking situation and where to check in, pre-test NPO status and medications ordered, and verified current allergies; name and call back number provided for further questions should they arise  Larey Brick RN Navigator Cardiac Imaging [facility] [phone] office [phone] cell  Patient verbalized understanding of how to take 13 hour prep. She will arrive at 3 PM.

## 2023-12-04 ENCOUNTER — Encounter (HOSPITAL_COMMUNITY): Payer: Self-pay

## 2023-12-04 ENCOUNTER — Ambulatory Visit (HOSPITAL_COMMUNITY)
Admission: RE | Admit: 2023-12-04 | Discharge: 2023-12-04 | Disposition: A | Discharge status: Home / Self Care | Payer: BC Managed Care – PPO | Source: Ambulatory Visit | Attending: Physician Assistant | Admitting: Physician Assistant

## 2023-12-04 DIAGNOSIS — R072 Precordial pain: Secondary | ICD-10-CM | POA: Insufficient documentation

## 2023-12-04 MED ORDER — NITROGLYCERIN 0.4 MG SL SUBL: 0.8000 mg | SUBLINGUAL_TABLET | Freq: Once | SUBLINGUAL | Status: DC

## 2023-12-04 MED ORDER — DILTIAZEM HCL 25 MG/5ML IV SOLN
INTRAVENOUS | Status: AC
  Filled 2023-12-04: qty 5

## 2023-12-04 MED ORDER — DILTIAZEM HCL 25 MG/5ML IV SOLN
10.0000 mg | INTRAVENOUS | Status: AC | PRN
  Administered 2023-12-04 (×2): 5 mg via INTRAVENOUS

## 2023-12-04 NOTE — Progress Notes (Signed)
Pt arrived for coronary CT. After ambulating to bay, HR in 110s. Laid back in recliner, allowed to rest for 15 min, HR still in 100s on reassessment. Per pt, HR is always elevated when walking.  Pt states she took her 13 hour prep and daily PO cardizem.  Pt given 20 mg total of IV cardizem, see MAR. BP remained within goal but HR remained in 90s. During breath holds, HR only dropped down to 83 on tail end of 7 second breath hold.   MD Jacques Navy called. Per MD, pt needs to be rescheduled with contrast allergy prep and more pre-medication. Pt also educated to use a wheelchair when coming in for scan to help keep HR low. Pt verbalized understanding. Cardiac navigators aware and will f/u with pt.

## 2023-12-15 IMAGING — CT CT ABD-PELV W/O CM
2 of 4 series · 17 of 46 positions shown, 19 images · non-contrast
Comparison: CT abdomen pelvis 12/08/2021

CLINICAL DATA: Postoperative abdominal pain

EXAM:
CT ABDOMEN AND PELVIS WITHOUT CONTRAST
TECHNIQUE: Multidetector CT imaging of the abdomen and pelvis was performed
following the standard protocol without IV contrast.

[Series 3: coronal st · coronal · 0.90mm/px · 3 of 166 slices shown]
[im 56/166  soft-tissue]
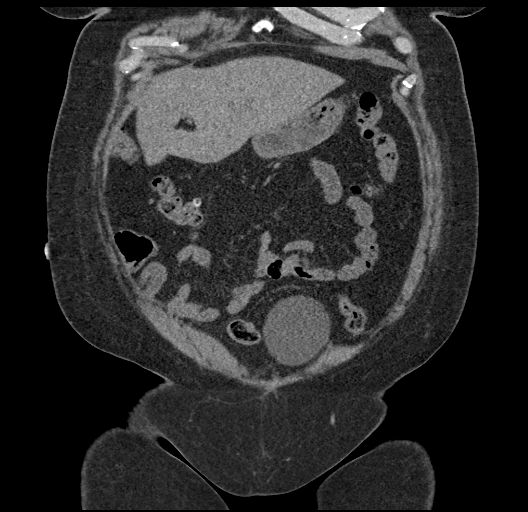
[im 74/166  soft-tissue]
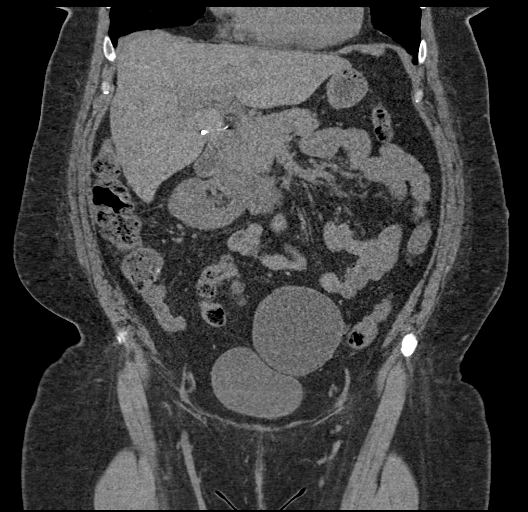
[im 92/166  soft-tissue]
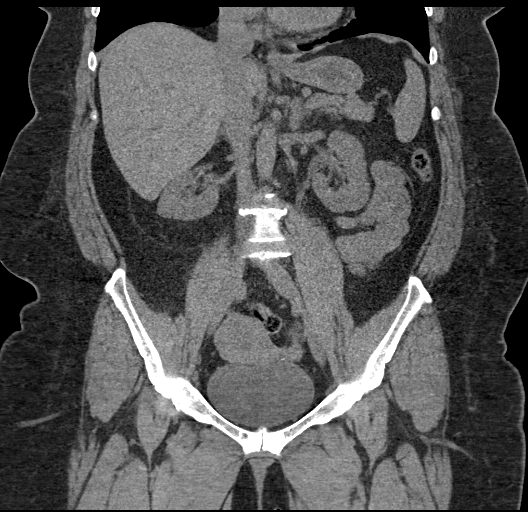

[Series 6: axial st · axial · 0.87mm/px · z∈[-530,-125]mm · 14 of 91 slices shown, 16 images]
[im 5/91  soft-tissue]
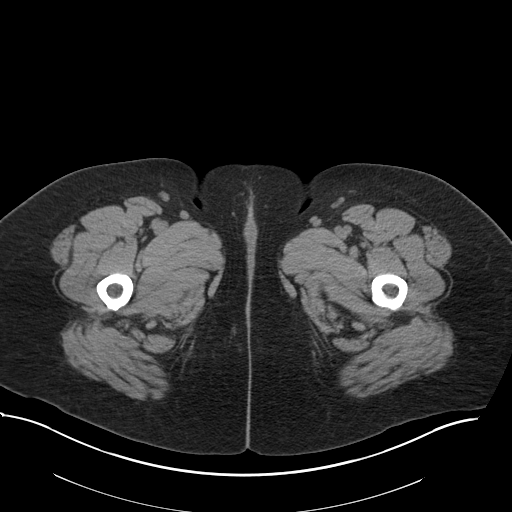
[im 5/91  bone]
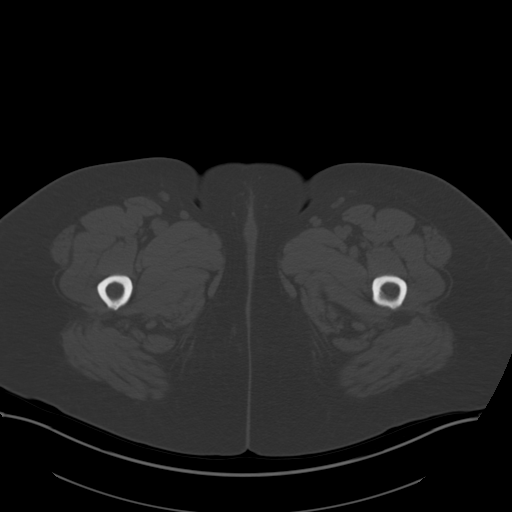
[im 14/91  soft-tissue]
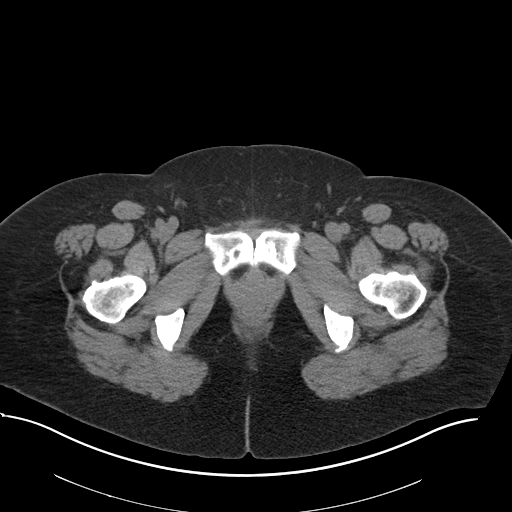
[im 19/91  soft-tissue]
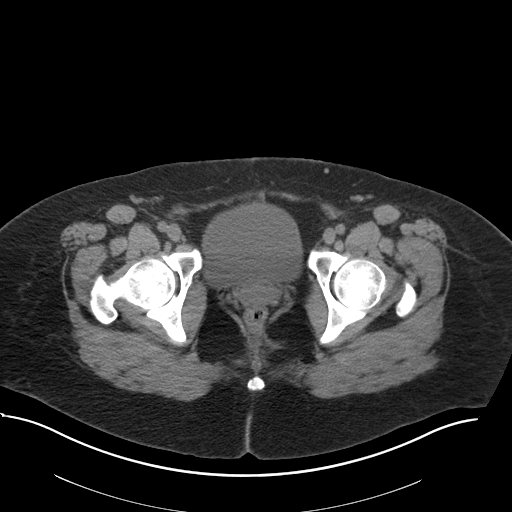
[im 23/91  soft-tissue]
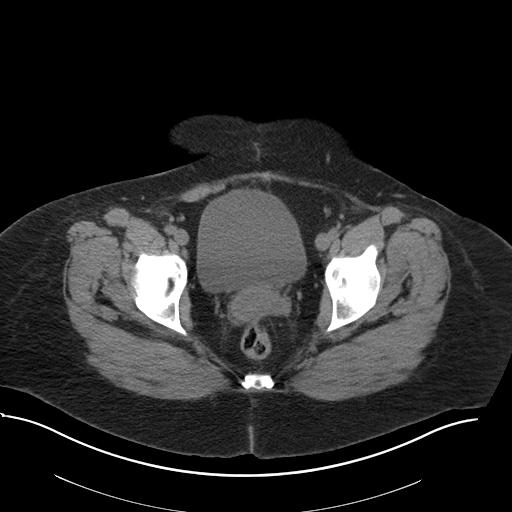
[im 32/91  soft-tissue]
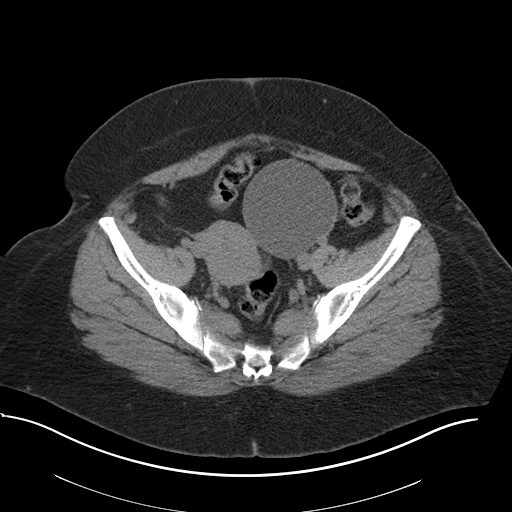
[im 37/91  soft-tissue]
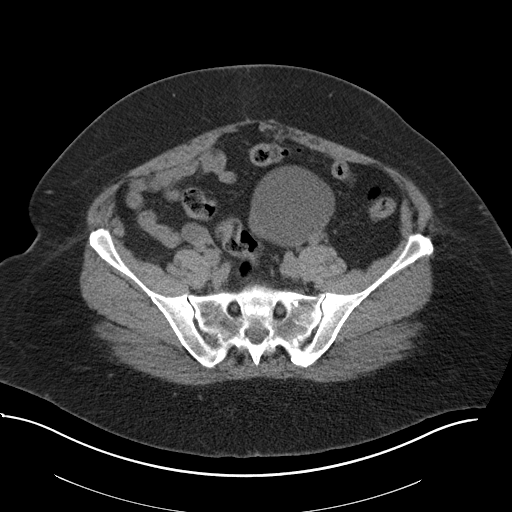
[im 41/91  soft-tissue]
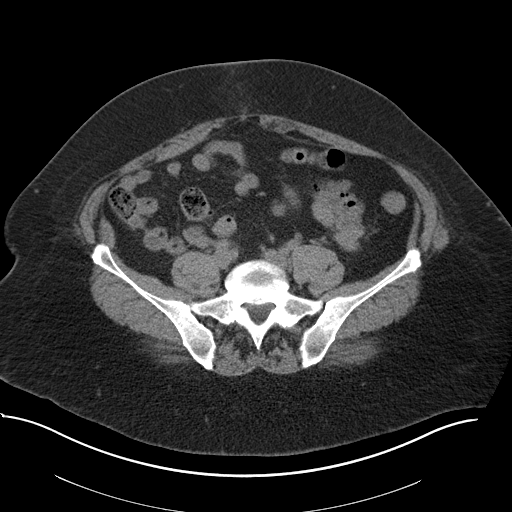
[im 50/91  soft-tissue]
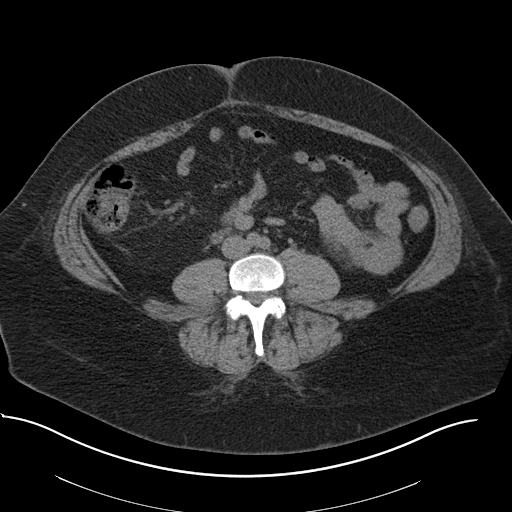
[im 55/91  soft-tissue]
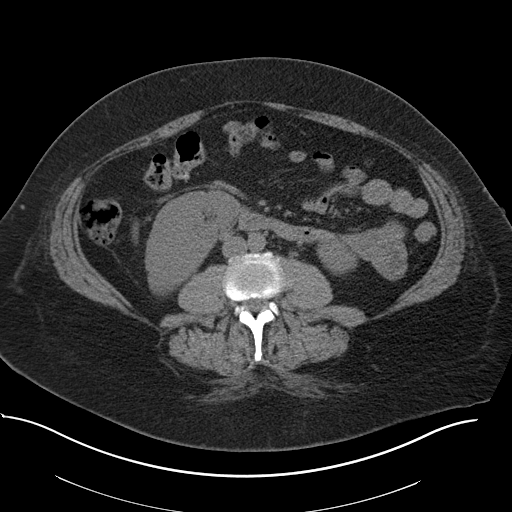
[im 55/91  bone]
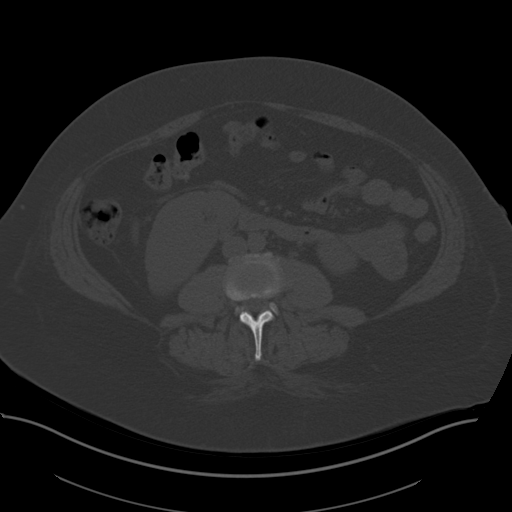
[im 59/91  soft-tissue]
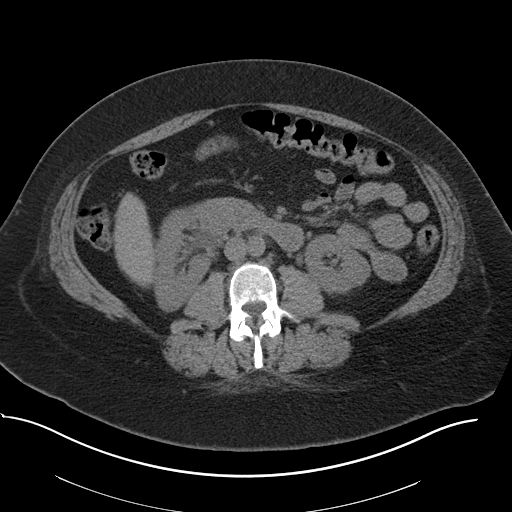
[im 68/91  soft-tissue]
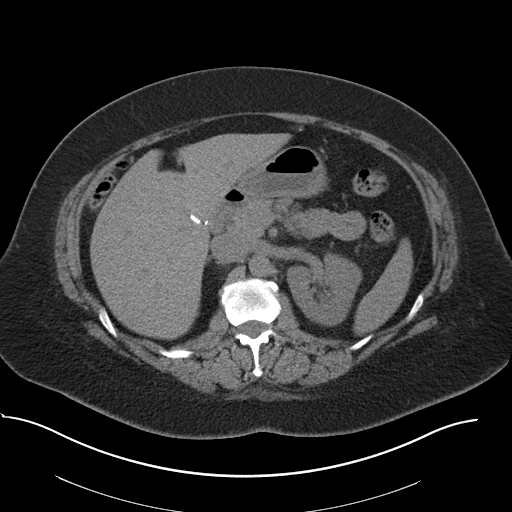
[im 73/91  soft-tissue]
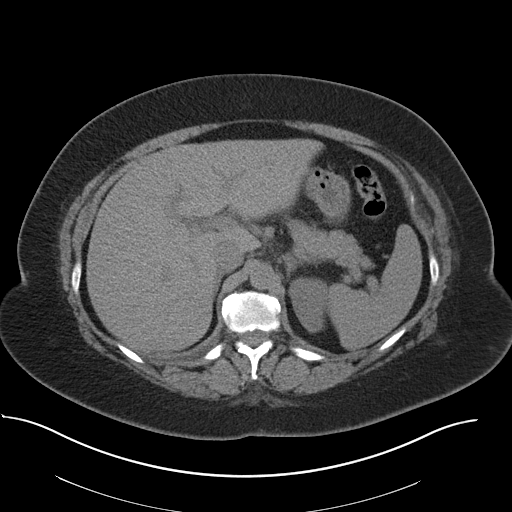
[im 77/91  soft-tissue]
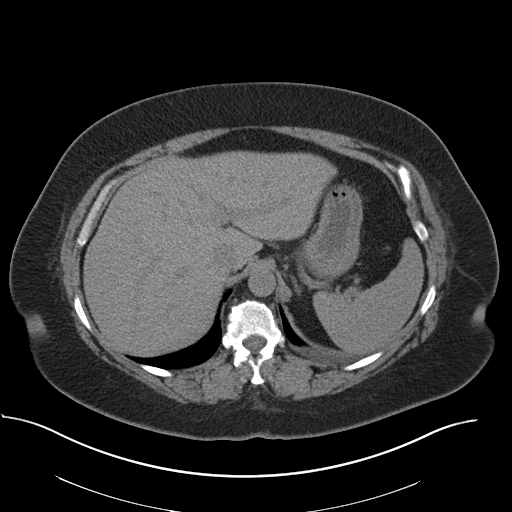
[im 86/91  soft-tissue]
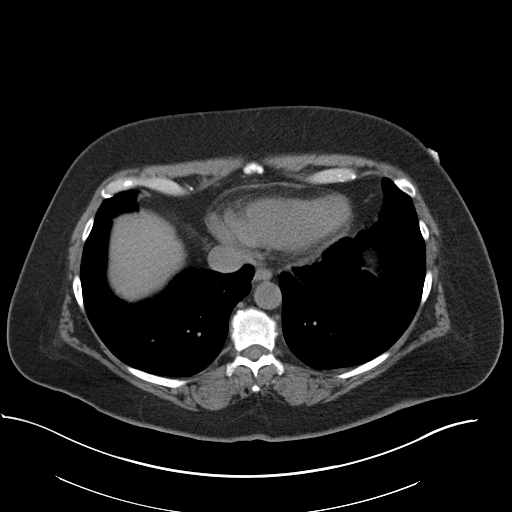

[17 of 46 positions shown; findings below may reference images not displayed]

FINDINGS: Lower chest: No acute abnormality.

Evaluation of the abdominal viscera is limited by the lack of IV
contrast.

Hepatobiliary: No focal liver abnormality is seen. Status post
cholecystectomy.

Pancreas: Unremarkable. No surrounding inflammatory changes.

Spleen: Normal in size without focal abnormality.

Adrenals/Urinary Tract: Adrenal glands are unremarkable. There is
nonspecific mild fullness of the right renal pelvis. No renal
calculi. No left hydronephrosis. Urinary bladder is unremarkable.

Stomach/Bowel: Stomach is within normal limits. Status post recent
appendectomy with expected postoperative changes in the right lower
quadrant. No evidence of bowel obstruction or inflammation.
Scattered colonic diverticula.

Vascular/Lymphatic: Vascular patency cannot be assessed in the
absence of IV contrast. No enlarged abdominal or pelvic lymph nodes.

Reproductive: There is a 8.6 cm left ovarian simple cyst. Normal
appearance of the right ovary and uterus.

Other: No abdominopelvic ascites.

Musculoskeletal: No acute finding.
IMPRESSION: 1. There is nonspecific mild fullness of the right renal pelvis. No
renal calculi.
2. Status post recent appendectomy with expected postoperative
changes in the right lower quadrant.
3. There is a 8.6 cm left ovarian simple cyst. Recommend follow-up
ultrasound on a nonemergent basis.

## 2023-12-17 ENCOUNTER — Other Ambulatory Visit (HOSPITAL_COMMUNITY): Payer: Self-pay

## 2023-12-17 ENCOUNTER — Encounter (HOSPITAL_COMMUNITY): Payer: Self-pay

## 2023-12-17 ENCOUNTER — Telehealth (HOSPITAL_COMMUNITY): Payer: Self-pay | Admitting: *Deleted

## 2023-12-17 MED ORDER — PREDNISONE 50 MG PO TABS
ORAL_TABLET | ORAL | 0 refills | Status: DC
  Filled 2023-12-17: qty 3, 1d supply, fill #0

## 2023-12-17 MED ORDER — IVABRADINE HCL 7.5 MG PO TABS
ORAL_TABLET | ORAL | 0 refills | Status: DC
  Filled 2023-12-17: qty 2, 1d supply, fill #0

## 2023-12-17 NOTE — Telephone Encounter (Signed)
Reaching out to patient to offer assistance regarding upcoming cardiac imaging study; pt verbalizes understanding of appt date/time, parking situation and where to check in, pre-test NPO status and medications ordered, and verified current allergies; name and call back number provided for further questions should they arise  Larey Brick RN Navigator Cardiac Imaging [facility] [phone] office [phone] cell  After discussing with Dr. Jacques Navy, patient to take an extra dose of her daily cardizem in addition to 15mg  ivabradine two hours prior to her cardiac CT scan. We reviewed how to take 13 hour prep. Patient verbalizes understanding of all instruction. Mychart instructions will also be provided to patient.

## 2023-12-18 ENCOUNTER — Ambulatory Visit (HOSPITAL_COMMUNITY): Admission: RE | Admit: 2023-12-18 | Payer: BC Managed Care – PPO | Source: Ambulatory Visit

## 2023-12-24 ENCOUNTER — Ambulatory Visit: Payer: BC Managed Care – PPO | Admitting: Adult Health

## 2024-01-04 ENCOUNTER — Ambulatory Visit (HOSPITAL_COMMUNITY)
Admission: RE | Admit: 2024-01-04 | Discharge: 2024-01-04 | Disposition: A | Discharge status: Home / Self Care | Payer: BC Managed Care – PPO | Source: Ambulatory Visit | Attending: Physician Assistant | Admitting: Physician Assistant

## 2024-01-04 DIAGNOSIS — R072 Precordial pain: Secondary | ICD-10-CM | POA: Insufficient documentation

## 2024-01-04 MED ORDER — NITROGLYCERIN 0.4 MG SL SUBL
0.8000 mg | SUBLINGUAL_TABLET | Freq: Once | SUBLINGUAL | Status: AC
Start: 2024-01-04 — End: 2024-01-04
  Administered 2024-01-04: 0.8 mg via SUBLINGUAL

## 2024-01-04 MED ORDER — PREDNISONE 20 MG PO TABS
20.0000 mg | ORAL_TABLET | Freq: Every day | ORAL | Status: AC
  Administered 2024-01-04: 20 mg via ORAL
  Filled 2024-01-04 (×2): qty 1

## 2024-01-04 MED ORDER — IOHEXOL 350 MG/ML SOLN
100.0000 mL | Freq: Once | INTRAVENOUS | Status: AC | PRN
  Administered 2024-01-04: 100 mL via INTRAVENOUS

## 2024-01-04 MED ORDER — NITROGLYCERIN 0.4 MG SL SUBL
SUBLINGUAL_TABLET | SUBLINGUAL | Status: AC
Start: 2024-01-04 — End: ?
  Filled 2024-01-04: qty 2

## 2024-01-04 NOTE — Progress Notes (Signed)
 Patient ID: Tiffany [name], female   DOB: April 24, 1971, 53 y.o.   MRN: [id number] Pt didn't take ivabradine  till 0730 cardiac ct scheduled for 0830. Prednisone  50mg   third dose taken at 0730. Per DR. Mclean pt must wait till 0930 for ivabradine  to work to [name] heart rate of 84 down. Pt.may come back at 0930 and add 20 mg of prednisone . Pt in agreeance.

## 2024-01-11 NOTE — Progress Notes (Deleted)
  Cardiology Office Note:  .   Date:  01/11/2024  ID:  Tiffany [name], DOB 11-30-1971, MRN [id number] PCP: Alda Ponder, FNP  [facility] Providers Cardiologist:  Parke Poisson, MD {  History of Present Illness: .   Tiffany [name] is a 53 y.o. female  hx of SVT, anxiety, PSVT, hypertension, Vaping. Seen in ED on 11/15/2023 with left sided chest pain that has been lasting one month.   ROS: ***  Studies Reviewed: Marland Kitchen      Coronary CTA 01/04/2024 IMPRESSION: 1. Coronary artery calcium score 0 Agatston units. This suggests low risk for future cardiac events.   2. No plaque or stenosis noted in the coronary tree though motion artifact led to difficult distal RCA and LCx images.  *** EKG Interpretation Date/Time:    Ventricular Rate:    PR Interval:    QRS Duration:    QT Interval:    QTC Calculation:   R Axis:      Text Interpretation:      Physical Exam:   VS:  There were no vitals taken for this visit.   Wt Readings from Last 3 Encounters:  11/16/23 254 lb 8 oz (115.4 kg)  09/14/23 247 lb (112 kg)  06/11/23 251 lb (113.9 kg)    GEN: Well nourished, well developed in no acute distress NECK: No JVD; No carotid bruits CARDIAC: ***RRR, no murmurs, rubs, gallops RESPIRATORY:  Clear to auscultation without rales, wheezing or rhonchi  ABDOMEN: Soft, non-tender, non-distended EXTREMITIES:  No edema; No deformity   ASSESSMENT AND PLAN: .   ***    {Are you ordering a CV Procedure (e.g. stress test, cath, DCCV, TEE, etc)?   Press F2        :[id number]}    Signed, Bettey Mare. Liborio Nixon, ANP, AACC

## 2024-01-14 NOTE — Progress Notes (Signed)
Extra-cardiac portion read by radiologist, no acute finding

## 2024-01-15 ENCOUNTER — Ambulatory Visit: Payer: BC Managed Care – PPO | Admitting: Adult Health

## 2024-02-01 ENCOUNTER — Other Ambulatory Visit: Payer: Self-pay

## 2024-02-01 ENCOUNTER — Emergency Department (HOSPITAL_COMMUNITY): Payer: BC Managed Care – PPO

## 2024-02-01 ENCOUNTER — Encounter (HOSPITAL_COMMUNITY): Payer: Self-pay

## 2024-02-01 ENCOUNTER — Inpatient Hospital Stay (HOSPITAL_COMMUNITY)
Admission: EM | Admit: 2024-02-01 | Discharge: 2024-02-05 | DRG: 357 | Disposition: A | Discharge status: Home / Self Care | Payer: BC Managed Care – PPO | Attending: Internal Medicine | Admitting: Internal Medicine

## 2024-02-01 DIAGNOSIS — Z823 Family history of stroke: Secondary | ICD-10-CM

## 2024-02-01 DIAGNOSIS — M67431 Ganglion, right wrist: Secondary | ICD-10-CM | POA: Diagnosis present

## 2024-02-01 DIAGNOSIS — N390 Urinary tract infection, site not specified: Secondary | ICD-10-CM | POA: Diagnosis present

## 2024-02-01 DIAGNOSIS — Z91041 Radiographic dye allergy status: Secondary | ICD-10-CM

## 2024-02-01 DIAGNOSIS — R519 Headache, unspecified: Secondary | ICD-10-CM | POA: Diagnosis present

## 2024-02-01 DIAGNOSIS — Z9049 Acquired absence of other specified parts of digestive tract: Secondary | ICD-10-CM

## 2024-02-01 DIAGNOSIS — M1990 Unspecified osteoarthritis, unspecified site: Secondary | ICD-10-CM | POA: Diagnosis present

## 2024-02-01 DIAGNOSIS — I471 Supraventricular tachycardia, unspecified: Secondary | ICD-10-CM | POA: Insufficient documentation

## 2024-02-01 DIAGNOSIS — N83292 Other ovarian cyst, left side: Secondary | ICD-10-CM | POA: Diagnosis present

## 2024-02-01 DIAGNOSIS — R29818 Other symptoms and signs involving the nervous system: Secondary | ICD-10-CM | POA: Diagnosis present

## 2024-02-01 DIAGNOSIS — F444 Conversion disorder with motor symptom or deficit: Secondary | ICD-10-CM | POA: Insufficient documentation

## 2024-02-01 DIAGNOSIS — R0789 Other chest pain: Secondary | ICD-10-CM | POA: Diagnosis present

## 2024-02-01 DIAGNOSIS — Z90721 Acquired absence of ovaries, unilateral: Secondary | ICD-10-CM

## 2024-02-01 DIAGNOSIS — R1011 Right upper quadrant pain: Principal | ICD-10-CM

## 2024-02-01 DIAGNOSIS — R823 Hemoglobinuria: Secondary | ICD-10-CM | POA: Diagnosis present

## 2024-02-01 DIAGNOSIS — Z87891 Personal history of nicotine dependence: Secondary | ICD-10-CM

## 2024-02-01 DIAGNOSIS — Z7982 Long term (current) use of aspirin: Secondary | ICD-10-CM

## 2024-02-01 DIAGNOSIS — D351 Benign neoplasm of parathyroid gland: Secondary | ICD-10-CM | POA: Diagnosis present

## 2024-02-01 DIAGNOSIS — K561 Intussusception: Secondary | ICD-10-CM | POA: Diagnosis not present

## 2024-02-01 DIAGNOSIS — Z79899 Other long term (current) drug therapy: Secondary | ICD-10-CM

## 2024-02-01 DIAGNOSIS — Z8249 Family history of ischemic heart disease and other diseases of the circulatory system: Secondary | ICD-10-CM

## 2024-02-01 DIAGNOSIS — Z806 Family history of leukemia: Secondary | ICD-10-CM

## 2024-02-01 DIAGNOSIS — Z7985 Long-term (current) use of injectable non-insulin antidiabetic drugs: Secondary | ICD-10-CM

## 2024-02-01 DIAGNOSIS — Z825 Family history of asthma and other chronic lower respiratory diseases: Secondary | ICD-10-CM

## 2024-02-01 DIAGNOSIS — F411 Generalized anxiety disorder: Secondary | ICD-10-CM | POA: Diagnosis present

## 2024-02-01 DIAGNOSIS — I10 Essential (primary) hypertension: Secondary | ICD-10-CM | POA: Insufficient documentation

## 2024-02-01 DIAGNOSIS — Z6841 Body Mass Index (BMI) 40.0 and over, adult: Secondary | ICD-10-CM

## 2024-02-01 DIAGNOSIS — Z818 Family history of other mental and behavioral disorders: Secondary | ICD-10-CM

## 2024-02-01 DIAGNOSIS — E66813 Obesity, class 3: Secondary | ICD-10-CM | POA: Diagnosis present

## 2024-02-01 DIAGNOSIS — F32A Depression, unspecified: Secondary | ICD-10-CM | POA: Diagnosis present

## 2024-02-01 DIAGNOSIS — Z83438 Family history of other disorder of lipoprotein metabolism and other lipidemia: Secondary | ICD-10-CM

## 2024-02-01 DIAGNOSIS — Z8051 Family history of malignant neoplasm of kidney: Secondary | ICD-10-CM

## 2024-02-01 DIAGNOSIS — K219 Gastro-esophageal reflux disease without esophagitis: Secondary | ICD-10-CM | POA: Diagnosis present

## 2024-02-01 DIAGNOSIS — N83202 Unspecified ovarian cyst, left side: Secondary | ICD-10-CM | POA: Diagnosis not present

## 2024-02-01 DIAGNOSIS — Z888 Allergy status to other drugs, medicaments and biological substances status: Secondary | ICD-10-CM

## 2024-02-01 DIAGNOSIS — Z98891 History of uterine scar from previous surgery: Secondary | ICD-10-CM

## 2024-02-01 DIAGNOSIS — E559 Vitamin D deficiency, unspecified: Secondary | ICD-10-CM | POA: Diagnosis present

## 2024-02-01 HISTORY — DX: Dyspnea, unspecified: R06.00

## 2024-02-01 HISTORY — DX: Other specified postprocedural states: Z98.890

## 2024-02-01 HISTORY — DX: Nausea with vomiting, unspecified: R11.2

## 2024-02-01 HISTORY — DX: Other complications of anesthesia, initial encounter: T88.59XA

## 2024-02-01 HISTORY — DX: Angina pectoris, unspecified: I20.9

## 2024-02-01 LAB — URINALYSIS, ROUTINE W REFLEX MICROSCOPIC
Bilirubin Urine: NEGATIVE
Glucose, UA: NEGATIVE mg/dL
Ketones, ur: NEGATIVE mg/dL
Nitrite: NEGATIVE
Protein, ur: NEGATIVE mg/dL
Specific Gravity, Urine: 1.015 (ref 1.005–1.030)
WBC, UA: >50 WBC/hpf (ref 0–5)
pH: 5 (ref 5.0–8.0)

## 2024-02-01 LAB — CBC
HCT: 44.4 % (ref 36.0–46.0)
Hemoglobin: 14.8 g/dL (ref 12.0–15.0)
MCH: 31.3 pg (ref 26.0–34.0)
MCHC: 33.3 g/dL (ref 30.0–36.0)
MCV: 93.9 fL (ref 80.0–100.0)
Platelets: 258 10*3/uL (ref 150–400)
RBC: 4.73 MIL/uL (ref 3.87–5.11)
RDW: 12.5 % (ref 11.5–15.5)
WBC: 7.7 10*3/uL (ref 4.0–10.5)
nRBC: 0 % (ref 0.0–0.2)

## 2024-02-01 LAB — COMPREHENSIVE METABOLIC PANEL
ALT: 15 U/L (ref 0–44)
AST: 18 U/L (ref 15–41)
Albumin: 3.8 g/dL (ref 3.5–5.0)
Alkaline Phosphatase: 144 U/L — ABNORMAL HIGH (ref 38–126)
Anion gap: 9 (ref 5–15)
BUN: 11 mg/dL (ref 6–20)
CO2: 23 mmol/L (ref 22–32)
Calcium: 8.9 mg/dL (ref 8.9–10.3)
Chloride: 106 mmol/L (ref 98–111)
Creatinine, Ser: 0.77 mg/dL (ref 0.44–1.00)
GFR, Estimated: >60 mL/min (ref >60)
Glucose, Bld: 136 mg/dL — ABNORMAL HIGH (ref 70–99)
Potassium: 4 mmol/L (ref 3.5–5.1)
Sodium: 138 mmol/L (ref 135–145)
Total Bilirubin: 0.9 mg/dL (ref 0.0–1.2)
Total Protein: 6.9 g/dL (ref 6.5–8.1)

## 2024-02-01 LAB — HCG, SERUM, QUALITATIVE: Preg, Serum: NEGATIVE

## 2024-02-01 LAB — LIPASE, BLOOD: Lipase: 37 U/L (ref 11–51)

## 2024-02-01 MED ORDER — ONDANSETRON HCL 4 MG/2ML IJ SOLN
4.0000 mg | Freq: Once | INTRAMUSCULAR | Status: AC
Start: 2024-02-01 — End: 2024-02-01
  Administered 2024-02-01: 4 mg via INTRAVENOUS
  Filled 2024-02-01: qty 2

## 2024-02-01 MED ORDER — ONDANSETRON HCL 4 MG/2ML IJ SOLN: 4.0000 mg | Freq: Four times a day (QID) | INTRAMUSCULAR | Status: DC | PRN

## 2024-02-01 MED ORDER — SENNOSIDES-DOCUSATE SODIUM 8.6-50 MG PO TABS: 1.0000 | ORAL_TABLET | Freq: Every evening | ORAL | Status: DC | PRN

## 2024-02-01 MED ORDER — ONDANSETRON HCL 4 MG PO TABS: 4.0000 mg | ORAL_TABLET | Freq: Four times a day (QID) | ORAL | Status: DC | PRN

## 2024-02-01 MED ORDER — LOSARTAN POTASSIUM 25 MG PO TABS
25.0000 mg | ORAL_TABLET | Freq: Every day | ORAL | Status: DC
  Administered 2024-02-02 – 2024-02-05 (×4): 25 mg via ORAL
  Filled 2024-02-01 (×5): qty 1

## 2024-02-01 MED ORDER — SODIUM CHLORIDE 0.9 % IV BOLUS
1000.0000 mL | Freq: Once | INTRAVENOUS | Status: AC
  Administered 2024-02-01: 1000 mL via INTRAVENOUS

## 2024-02-01 MED ORDER — ACETAMINOPHEN 325 MG PO TABS
650.0000 mg | ORAL_TABLET | Freq: Four times a day (QID) | ORAL | Status: DC | PRN
Start: 2024-02-01 — End: 2024-02-04
  Administered 2024-02-03 – 2024-02-04 (×2): 650 mg via ORAL
  Filled 2024-02-01 (×2): qty 2

## 2024-02-01 MED ORDER — KETOROLAC TROMETHAMINE 15 MG/ML IJ SOLN
15.0000 mg | Freq: Once | INTRAMUSCULAR | Status: AC
  Administered 2024-02-01: 15 mg via INTRAVENOUS
  Filled 2024-02-01: qty 1

## 2024-02-01 MED ORDER — DULOXETINE HCL 30 MG PO CPEP
60.0000 mg | ORAL_CAPSULE | Freq: Every day | ORAL | Status: DC
  Administered 2024-02-01 – 2024-02-04 (×4): 60 mg via ORAL
  Filled 2024-02-01 (×4): qty 2

## 2024-02-01 MED ORDER — DILTIAZEM HCL ER COATED BEADS 120 MG PO CP24
120.0000 mg | ORAL_CAPSULE | Freq: Every day | ORAL | Status: DC
  Administered 2024-02-01 – 2024-02-05 (×5): 120 mg via ORAL
  Filled 2024-02-01 (×5): qty 1

## 2024-02-01 MED ORDER — PREDNISONE 50 MG PO TABS
50.0000 mg | ORAL_TABLET | ORAL | Status: AC
  Administered 2024-02-01 – 2024-02-02 (×3): 50 mg via ORAL
  Filled 2024-02-01 (×3): qty 1

## 2024-02-01 MED ORDER — ACETAMINOPHEN 650 MG RE SUPP: 650.0000 mg | Freq: Four times a day (QID) | RECTAL | Status: DC | PRN

## 2024-02-01 MED ORDER — ALPRAZOLAM 0.5 MG PO TABS: 0.5000 mg | ORAL_TABLET | Freq: Every day | ORAL | Status: DC | PRN

## 2024-02-01 MED ORDER — HYDROMORPHONE HCL 1 MG/ML IJ SOLN
1.0000 mg | Freq: Once | INTRAMUSCULAR | Status: AC
  Administered 2024-02-01: 1 mg via INTRAVENOUS
  Filled 2024-02-01: qty 1

## 2024-02-01 MED ORDER — ENOXAPARIN SODIUM 40 MG/0.4ML IJ SOSY
40.0000 mg | PREFILLED_SYRINGE | INTRAMUSCULAR | Status: DC
  Administered 2024-02-01 – 2024-02-03 (×3): 40 mg via SUBCUTANEOUS
  Filled 2024-02-01 (×3): qty 0.4

## 2024-02-01 MED ORDER — HYDROMORPHONE HCL 1 MG/ML IJ SOLN: 1.0000 mg | Freq: Four times a day (QID) | INTRAMUSCULAR | Status: DC | PRN

## 2024-02-01 MED ORDER — MORPHINE SULFATE (PF) 4 MG/ML IV SOLN
4.0000 mg | Freq: Once | INTRAVENOUS | Status: AC
Start: 2024-02-01 — End: 2024-02-01
  Administered 2024-02-01: 4 mg via INTRAVENOUS
  Filled 2024-02-01: qty 1

## 2024-02-01 MED ORDER — ALPRAZOLAM 0.5 MG PO TABS
0.5000 mg | ORAL_TABLET | Freq: Every day | ORAL | Status: DC
  Administered 2024-02-01 – 2024-02-04 (×4): 0.5 mg via ORAL
  Filled 2024-02-01 (×4): qty 1

## 2024-02-01 MED ORDER — DIPHENHYDRAMINE HCL 50 MG/ML IJ SOLN
50.0000 mg | Freq: Once | INTRAMUSCULAR | Status: AC
  Administered 2024-02-02: 50 mg via INTRAVENOUS
  Filled 2024-02-01: qty 1

## 2024-02-01 NOTE — H&P (Addendum)
 History and Physical  Tiffany [name] FMW:[id number] DOB: [birth year] DOA: 02/01/2024  PCP: Windle Farrel DEL, FNP   Chief Complaint: Abdominal pain  HPI: Tiffany [name] is a 53 y.o. female with medical history significant for anxiety and depression, SVT, complex regional pain syndrome and arthritis who presented to the ED for evaluation of abdominal pain. Patient reports she noticed a lump in her right upper quadrant and 2 days ago she started having abdominal pain in the area. The pain is located in her epigastrium and RUQ, described as pressure but with elements of burning last time. She reports 1 episode of watery stool denies any nausea, vomiting, fevers, chills, headache, dizziness, hematuria, dysuria or shortness of breath.  ED Course: Initial vitals stable but hypertensive with SBP in the 150s. Labs showed normal CBC, kidney function, pregnancy test and lipase. UA shows moderate hemoglobinuria, negative nitrite, large leuks, RBC 21-50, WBC >50, and rare bacteria.  CT A/P shows short segment small bowel intussusception but no SBO as well as left adnexa cyst slightly decreased in size. Patient was given IV NS 1 L bolus, IV Zofran  4 mg x 1, IV morphine  4 mg x 1, Toradol  15 mg x 1 and IV Dilaudid  1 mg x 1. General surgery consulted for evaluation. TRH consulted for admission  Review of Systems: Please see HPI for pertinent positives and negatives. A complete 10 system review of systems are otherwise negative.  Past Medical History:  Diagnosis Date   Allergy    Anxiety    Arthritis    CRPS (complex regional pain syndrome type I)    Depression    DES exposure in utero    SVT (supraventricular tachycardia) (HCC)    Past Surgical History:  Procedure Laterality Date   APPENDECTOMY     CESAREAN SECTION     x2   CHOLECYSTECTOMY     LAPAROSCOPIC APPENDECTOMY N/A 12/08/2021   Procedure: APPENDECTOMY LAPAROSCOPIC;  Surgeon: Paola Dreama SAILOR, MD;  Location: [facility] OR;  Service: General;  Laterality: N/A;    SALPINGOSTOMY     due to ectopic pregnancy   Social History:  reports that she has quit smoking. Her smoking use included cigarettes. She has a 7.5 pack-year smoking history. She has never used smokeless tobacco. She reports that she does not currently use alcohol. She reports that she does not use drugs.  Allergies  Allergen Reactions   Contrast Media [Iodinated Contrast Media] Shortness Of Breath   Stadol [Butorphanol] Shortness Of Breath   Other     Beta blockers causes wheezing      Family History  Problem Relation Age of Onset   Heart disease Mother    Hyperlipidemia Mother    Hypertension Mother    Mental illness Mother    COPD Mother    Mental illness Father        bipolar   Hyperlipidemia Sister    Hypertension Sister    Supraventricular tachycardia Sister    Hypertension Brother    Cancer Maternal Grandmother    Heart disease Maternal Grandmother    Hyperlipidemia Maternal Grandmother    Hypertension Maternal Grandmother    Stroke Maternal Grandmother    Supraventricular tachycardia Sister    Arthritis Daughter        JRA   Arthritis Daughter        JRA     Prior to Admission medications   Medication Sig Start Date End Date Taking? Authorizing Provider  ALPRAZolam  (XANAX ) 0.5 MG tablet Take 1  tablet (0.5 mg total) by mouth daily as needed for anxiety. Patient taking differently: Take 0.5 mg by mouth See admin instructions. 0.5mg  every night And 0.5mg  daily as needed for panic 10/03/16   English, Stephanie D, [place]  aspirin  81 MG chewable tablet Chew 81 mg by mouth daily.    [provider]  colchicine  0.6 MG tablet Take 1 tablet (0.6 mg total) by mouth 2 (two) times daily for 15 days. 11/16/23 12/01/23  Lue Elsie BROCKS, MD  diltiazem  (CARDIZEM  LA) 120 MG 24 hr tablet Take 120 mg by mouth daily. 09/24/22   [provider]  DULoxetine  (CYMBALTA ) 60 MG capsule Take 1 capsule (60 mg total) by mouth daily. 10/03/16   Isadora Corean BIRCH, PA   ivabradine  (CORLANOR) 7.5 MG TABS tablet Take 2 tablets (15mg ) by mouth TWO hours prior to your cardiac CT scan. 12/17/23   Loni Soyla LABOR, MD  losartan  (COZAAR ) 25 MG tablet Take 25 mg by mouth daily.    [provider]  nitroGLYCERIN  (NITROSTAT ) 0.4 MG SL tablet Place 1 tablet (0.4 mg total) under the tongue every 5 (five) minutes as needed for chest pain. 11/16/23   Lue Elsie BROCKS, MD  predniSONE  (DELTASONE ) 50 MG tablet Take 1 tablet by mouth 13 hours, 7 hours, and 1 hour before coronary CT to decrease reaction with contrast 12/17/23   Acharya, Gayatri A, MD  Semaglutide,0.25 or 0.5MG /DOS, (OZEMPIC, 0.25 OR 0.5 MG/DOSE,) 2 MG/3ML SOPN 0.25 mg x4 weeks; then 0.5 mg x 4 weeks then 1 mg weekly thereafter 09/21/23   [provider]    Physical Exam: BP (!) 131/99   Pulse 61   Temp 97.7 F (36.5 C) (Oral)   Resp 17   Ht 5' 3 (1.6 m)   Wt 112.5 kg   SpO2 97%   BMI 43.93 kg/m  General: Pleasant, well-appearing middle-age woman laying in bed. No acute distress. HEENT: Tiffany [name]. Anicteric sclera CV: RRR. No murmurs, rubs, or gallops. No LE edema Pulmonary: Lungs CTAB. Normal effort. No wheezing or rales. Abdominal: Soft, nondistended. Mild ttp of the RUQ and epigastrium.  No guarding or rebound tenderness. No masses or hernia. Normal bowel sounds. Extremities: Palpable radial and DP pulses. Normal ROM. Skin: Warm and dry. No obvious rash or lesions. Neuro: A&Ox3. Moves all extremities. Normal sensation to light touch. No focal deficit. Psych: Normal mood and affect          Labs on Admission:  Basic Metabolic Panel: Recent Labs  Lab 02/01/24 0820  NA 138  K 4.0  CL 106  CO2 23  GLUCOSE 136*  BUN 11  CREATININE 0.77  CALCIUM 8.9   Liver Function Tests: Recent Labs  Lab 02/01/24 0820  AST 18  ALT 15  ALKPHOS 144*  BILITOT 0.9  PROT 6.9  ALBUMIN 3.8   Recent Labs  Lab 02/01/24 0820  LIPASE 37   No results for input(s): AMMONIA in the  last 168 hours. CBC: Recent Labs  Lab 02/01/24 0820  WBC 7.7  HGB 14.8  HCT 44.4  MCV 93.9  PLT 258   Cardiac Enzymes: No results for input(s): CKTOTAL, CKMB, CKMBINDEX, TROPONINI in the last 168 hours. BNP (last 3 results) No results for input(s): BNP in the last 8760 hours.  ProBNP (last 3 results) No results for input(s): PROBNP in the last 8760 hours.  CBG: No results for input(s): GLUCAP in the last 168 hours.  Radiological Exams on Admission: CT ABDOMEN PELVIS WO CONTRAST Result  Date: 02/01/2024 CLINICAL DATA:  Two day history of right upper quadrant abdominal pain EXAM: CT ABDOMEN AND PELVIS WITHOUT CONTRAST TECHNIQUE: Multidetector CT imaging of the abdomen and pelvis was performed following the standard protocol without IV contrast. RADIATION DOSE REDUCTION: This exam was performed according to the departmental dose-optimization program which includes automated exposure control, adjustment of the mA and/or kV according to patient size and/or use of iterative reconstruction technique. COMPARISON:  CT abdomen and pelvis dated 05/02/2023 and multiple priors FINDINGS: Lower chest: No focal consolidation or pulmonary nodule in the lung bases. No pleural effusion or pneumothorax demonstrated. Partially imaged heart size is normal. Hepatobiliary: Subcentimeter segment 3 hypodensity (2:25), too small to characterize, but unchanged dating back to 12/08/2021, likely benign. No intra or extrahepatic biliary ductal dilation. Cholecystectomy. Pancreas: No focal lesions or main ductal dilation. Spleen: Normal in size without focal abnormality. Adrenals/Urinary Tract: No adrenal nodules. No suspicious renal mass on this noncontrast enhanced examination , calculi or hydronephrosis. No focal bladder wall thickening. Stomach/Bowel: Normal appearance of the stomach. When compared to multiple prior CT examinations, the proximal jejunum beyond the level of the duodenojejunal junction is  displaced into the right hemiabdomen on today's examination (8:52) with short-segment small bowel telescoping (8:51). No evidence of bowel wall thickening, distention, or inflammatory changes. Appendectomy. Vascular/Lymphatic: No significant vascular findings are present. No enlarged abdominal or pelvic lymph nodes. Reproductive: Again seen is simple fluid attenuation left adnexal cyst measuring 7.9 x 7.6 cm, slightly decreased in size from 8.3 x 8.2 cm on 05/02/2023. Along the superomedial aspect of this cyst, there is a 10 mm nodular focus (2:60). No right adnexal mass. Other: No free fluid, fluid collection, or free air. Musculoskeletal: No acute or abnormal lytic or blastic osseous lesions. Multilevel degenerative changes of the partially imaged thoracic and lumbar spine. IMPRESSION: 1. When compared to multiple prior CT examinations, the proximal jejunum is displaced into the right hemiabdomen on today's examination with short-segment small bowel intussusception. No current evidence of bowel obstruction. Findings may reflect mesenteric laxity. Small-bowel intussusception is most often physiologic and transient. Consider follow-up nonemergent CT enterography to assess resolution and evaluate for possible lead point. 2. No CT findings of bile duct dilation. 3. Slightly decreased size of simple fluid attenuation left adnexal cyst measuring 7.9 x 7.6 cm, previously 8.3 x 8.2 cm on 05/02/2023. Along the superomedial aspect of this cyst, there is a 10 mm nodular focus, indeterminate. Recommend further evaluation with nonemergent pelvic ultrasound examination. Electronically Signed   By: Limin  Xu M.D.   On: 02/01/2024 12:16   Assessment/Plan Tiffany [name] is a 53 y.o. female with medical history significant for anxiety and depression, SVT, complex regional pain syndrome and arthritis who presented to the ED for evaluation of abdominal pain and found to have small bowel dissection.  # Small bowel  intussusception Patient with a history of multiple abdominal surgeries presenting with 2 days of worsening abdominal pain found to have CT showing evidence of small jejunal displaced into the right hemiabdomen with short segment small bowel intussusception but no SBO. Patient hemodynamically stable. Abdomen mildly tender on exam but no peritonitis. General surgery has evaluated patient and recommended observation overnight with repeat CT with contrast tomorrow. Patient reports she has been premedicated for CT scan with contrast before and did fine so she would prefer to have the scan with contrast.  -Admit to MedSurg for observation -General Surgery following, appreciate recs -Repeat CT A/P with contrast tomorrow morning, will premedication with  prednisone  50 mg 13h, 7h and 1h before scan and IV Benadryl  50 mg 1h before scan) -IV Dilaudid  as needed for pain -Continue clear liquid diet  # Hx Ovarian cyst Reports history of left ovarian cyst for many years. CT shows slightly decreased size of the left ovarian cyst. Patient denies any pelvic discomfort. -Follow-up with OB/GYN in the outpatient  # Anxiety and depression -Continue home duloxetine  and Xanax   # HTN BP elevated with SBP in the 130s to 150s. -Continue losartan   # SVT HR stable in the 60s to 70s. -Continue diltiazem   DVT prophylaxis: Lovenox      Code Status: Full Code  Consults called: General Surgery  Family Communication: Discussed admission with spouse and daughter at bedside  Severity of Illness: The appropriate patient status for this patient is OBSERVATION. Observation status is judged to be reasonable and necessary in order to provide the required intensity of service to ensure the patient's safety. The patient's presenting symptoms, physical exam findings, and initial radiographic and laboratory data in the context of their medical condition is felt to place them at decreased risk for further clinical deterioration.  Furthermore, it is anticipated that the patient will be medically stable for discharge from the hospital within 2 midnights of admission.   Level of care:  MedSurg.  Lou Claretta HERO, MD 02/01/2024, 5:43 PM [facility] Pager: [phone] Isaiah 41:10   If 7PM-7AM, please contact night-coverage [email or web address] Password [email or web address]

## 2024-02-01 NOTE — ED Provider Notes (Signed)
 53 yo here with intermittent abdominal pain Found to have intussusception on CT imaging  Pending general surgery consult  Physical Exam  BP 132/66   Pulse 68   Temp 97.7 F (36.5 C) (Oral)   Resp 11   Ht 5' 3 (1.6 m)   Wt 112.5 kg   SpO2 95%   BMI 43.93 kg/m   Physical Exam  Procedures  Procedures  ED Course / MDM   Clinical Course as of 02/01/24 1726  Fri Feb 01, 2024  1659 Patient reassessed and reports her pain is now returning after the pain medication.  She is not nauseated and in fact is hungry and requesting food.  Per my discussion with the general surgery PA who consulted on the patient, at this point with worsening pain she will be admitted to the hospital service, as there is no acute surgical indication.  She will need repeat scanning, and I am clarifying with the surgery team when this repeat CT scan would be indicated.  I discussed with the patient and her family the ovarian cyst and the patient is well aware of this, says that this is a chronic diagnosis and she does not typically have pelvic pain from her cysts, and is no longer having menstrual cycles.  I do not suspect this cyst is needing further emergent workup and I do not believe this is the cause of her symptoms. [MT]  1703 Surgery team says clear liquid diet okay, repeat CT tomorrow [MT]  1725 Admitted to hospitalist [MT]    Clinical Course User Index [MT] Veryl Abril, Donnice PARAS, MD   Medical Decision Making Amount and/or Complexity of Data Reviewed Labs: ordered. Radiology: ordered.  Risk Prescription drug management. Decision regarding hospitalization.         Cottie Donnice PARAS, MD 02/01/24 1726

## 2024-02-01 NOTE — Consult Note (Signed)
 Consult Note  Tiffany [name] September 23, 1971  [id number].    Requesting MD: Dr. Nicholaus Chief Complaint/Reason for Consult: abdominal pain, intussusception  HPI:  53 y.o. female with medical history significant for SVT, anxiety, CRPS who presented to the [facility] ED with abdominal pain.  She first noted swelling to her right upper abdomen over the weekend and then developed pain starting 2 days ago.  She thought she may have a hernia.  Pain has been worsening since onset and is located in the epigastrium and right upper quadrant.  She describes the pain as severe.  Laying flat makes the pain a little bit better as well as pain meds.  Eating makes the pain worse.  She has associated mild nausea without emesis.  She had diarrhea 2 days ago.  No bowel movement in the last 24 hours.  She normally has a bowel movement 2-3 times per week.  She has never experienced symptoms like this before.  She has a history of chest pain and reflux and states this pain is different.  She denies fever, chills, shortness of breath.  She has never had a colonoscopy before.  She denies family history of GI cancers.  She has history of what sounds like upper GI workup in her 74s that she thinks was done for reflux and she does not remember any abnormal findings.  Additionally, she has history of lymphadenopathy in the right axilla for which she has undergone workup that she states resulted in benign findings.  Ultrasound from 05/2023 notes hyperechoic nodules in the subcutaneous soft tissue suggestive of lipomas.  She states since her abdominal symptoms began the swelling in her right axilla has increased.  Substance use: Occasional alcohol use, vapes Allergies: Iodinated contrast-shortness of breath Blood thinners: None Past Surgeries: Laparoscopic appendectomy, laparoscopic cholecystectomy, cesarean section x 2, exploratory laparotomy, laparoscopies x 2 for ectopic pregnancy   ROS: Reviewed and as above  Family  History  Problem Relation Age of Onset   Heart disease Mother    Hyperlipidemia Mother    Hypertension Mother    Mental illness Mother    COPD Mother    Mental illness Father        bipolar   Hyperlipidemia Sister    Hypertension Sister    Supraventricular tachycardia Sister    Hypertension Brother    Cancer Maternal Grandmother    Heart disease Maternal Grandmother    Hyperlipidemia Maternal Grandmother    Hypertension Maternal Grandmother    Stroke Maternal Grandmother    Supraventricular tachycardia Sister    Arthritis Daughter        JRA   Arthritis Daughter        JRA    Past Medical History:  Diagnosis Date   Allergy    Anxiety    Arthritis    CRPS (complex regional pain syndrome type I)    Depression    DES exposure in utero    SVT (supraventricular tachycardia) (HCC)     Past Surgical History:  Procedure Laterality Date   APPENDECTOMY     CESAREAN SECTION     x2   CHOLECYSTECTOMY     LAPAROSCOPIC APPENDECTOMY N/A 12/08/2021   Procedure: APPENDECTOMY LAPAROSCOPIC;  Surgeon: Paola Dreama SAILOR, MD;  Location: [facility] OR;  Service: General;  Laterality: N/A;   SALPINGOSTOMY     due to ectopic pregnancy    Social History:  reports that she has quit smoking. Her smoking use included cigarettes. She has  a 7.5 pack-year smoking history. She has never used smokeless tobacco. She reports that she does not currently use alcohol. She reports that she does not use drugs.  Allergies:  Allergies  Allergen Reactions   Contrast Media [Iodinated Contrast Media] Shortness Of Breath   Stadol [Butorphanol] Shortness Of Breath   Other     Beta blockers causes wheezing      (Not in a hospital admission)   Blood pressure 132/66, pulse 68, temperature 97.7 F (36.5 C), temperature source Oral, resp. rate 11, height 5' 3 (1.6 m), weight 112.5 kg, SpO2 95%. Physical Exam: General: pleasant, WD, female who is laying in bed in NAD HEENT: head is normocephalic, atraumatic.   Sclera are noninjected.  Pupils equal and round. EOMs intact.  Ears and nose without any masses or lesions.  Mouth is pink and moist Heart: regular, rate, and rhythm. Lungs:Respiratory effort nonlabored on room air Abd: soft, ND, no masses, hernias, or organomegaly, mild tenderness to palpation in epigastrium and right upper quadrant without guarding or rebound.  No erythema or fluctuance.  Several well-healed surgical scars MSK: all 4 extremities are symmetrical with no cyanosis, clubbing, or edema. Skin: warm and dry with no masses, lesions, or rashes.  Right axilla with palpable small soft tissue nodule which are mobile and NT. No overlying skin changes Neuro: Cranial nerves 2-12 grossly intact, sensation is normal throughout Psych: A&Ox3 with an appropriate affect.    Results for orders placed or performed during the hospital encounter of 02/01/24 (from the past 48 hours)  Lipase, blood     Status: None   Collection Time: 02/01/24  8:20 AM  Result Value Ref Range   Lipase 37 11 - 51 U/L    Comment: Performed at [facility], [address] [place], [place] [address]  Comprehensive metabolic panel     Status: Abnormal   Collection Time: 02/01/24  8:20 AM  Result Value Ref Range   Sodium 138 135 - 145 mmol/L   Potassium 4.0 3.5 - 5.1 mmol/L   Chloride 106 98 - 111 mmol/L   CO2 23 22 - 32 mmol/L   Glucose, Bld 136 (H) 70 - 99 mg/dL    Comment: Glucose reference range applies only to samples taken after fasting for at least 8 hours.   BUN 11 6 - 20 mg/dL   Creatinine, Ser 9.22 0.44 - 1.00 mg/dL   Calcium 8.9 8.9 - 89.6 mg/dL   Total Protein 6.9 6.5 - 8.1 g/dL   Albumin 3.8 3.5 - 5.0 g/dL   AST 18 15 - 41 U/L   ALT 15 0 - 44 U/L   Alkaline Phosphatase 144 (H) 38 - 126 U/L   Total Bilirubin 0.9 0.0 - 1.2 mg/dL   GFR, Estimated >39 >39 mL/min    Comment: (NOTE) Calculated using the CKD-EPI Creatinine Equation (2021)    Anion gap 9 5 - 15    Comment: Performed  at [facility], [address] [place], [place] [address]  CBC     Status: None   Collection Time: 02/01/24  8:20 AM  Result Value Ref Range   WBC 7.7 4.0 - 10.5 K/uL   RBC 4.73 3.87 - 5.11 MIL/uL   Hemoglobin 14.8 12.0 - 15.0 g/dL   HCT 55.5 63.9 - 53.9 %   MCV 93.9 80.0 - 100.0 fL   MCH 31.3 26.0 - 34.0 pg   MCHC 33.3 30.0 - 36.0 g/dL   RDW 12.5  11.5 - 15.5 %   Platelets 258 150 - 400 K/uL   nRBC 0.0 0.0 - 0.2 %    Comment: Performed at [facility], [address] [place], [place] [address]  Urinalysis, Routine w reflex microscopic -Urine, Clean Catch     Status: Abnormal   Collection Time: 02/01/24  8:20 AM  Result Value Ref Range   Color, Urine YELLOW YELLOW   APPearance CLOUDY (A) CLEAR   Specific Gravity, Urine 1.015 1.005 - 1.030   pH 5.0 5.0 - 8.0   Glucose, UA NEGATIVE NEGATIVE mg/dL   Hgb urine dipstick MODERATE (A) NEGATIVE   Bilirubin Urine NEGATIVE NEGATIVE   Ketones, ur NEGATIVE NEGATIVE mg/dL   Protein, ur NEGATIVE NEGATIVE mg/dL   Nitrite NEGATIVE NEGATIVE   Leukocytes,Ua LARGE (A) NEGATIVE   RBC / HPF 21-50 0 - 5 RBC/hpf   WBC, UA >50 0 - 5 WBC/hpf   Bacteria, UA RARE (A) NONE SEEN   Squamous Epithelial / HPF 11-20 0 - 5 /HPF   WBC Clumps PRESENT    Mucus PRESENT     Comment: Performed at [facility], [address], [place], [place] [address]  hCG, serum, qualitative     Status: None   Collection Time: 02/01/24  8:20 AM  Result Value Ref Range   Preg, Serum NEGATIVE NEGATIVE    Comment:        THE SENSITIVITY OF THIS METHODOLOGY IS >10 mIU/mL. Performed at [facility] [address] [place], [place] [address]    CT ABDOMEN PELVIS WO CONTRAST Result Date: 02/01/2024 CLINICAL DATA:  Two day history of right upper quadrant abdominal pain EXAM: CT ABDOMEN AND PELVIS WITHOUT CONTRAST TECHNIQUE: Multidetector CT imaging of the abdomen and pelvis was performed following the  standard protocol without IV contrast. RADIATION DOSE REDUCTION: This exam was performed according to the departmental dose-optimization program which includes automated exposure control, adjustment of the mA and/or kV according to patient size and/or use of iterative reconstruction technique. COMPARISON:  CT abdomen and pelvis dated 05/02/2023 and multiple priors FINDINGS: Lower chest: No focal consolidation or pulmonary nodule in the lung bases. No pleural effusion or pneumothorax demonstrated. Partially imaged heart size is normal. Hepatobiliary: Subcentimeter segment 3 hypodensity (2:25), too small to characterize, but unchanged dating back to 12/08/2021, likely benign. No intra or extrahepatic biliary ductal dilation. Cholecystectomy. Pancreas: No focal lesions or main ductal dilation. Spleen: Normal in size without focal abnormality. Adrenals/Urinary Tract: No adrenal nodules. No suspicious renal mass on this noncontrast enhanced examination , calculi or hydronephrosis. No focal bladder wall thickening. Stomach/Bowel: Normal appearance of the stomach. When compared to multiple prior CT examinations, the proximal jejunum beyond the level of the duodenojejunal junction is displaced into the right hemiabdomen on today's examination (8:52) with short-segment small bowel telescoping (8:51). No evidence of bowel wall thickening, distention, or inflammatory changes. Appendectomy. Vascular/Lymphatic: No significant vascular findings are present. No enlarged abdominal or pelvic lymph nodes. Reproductive: Again seen is simple fluid attenuation left adnexal cyst measuring 7.9 x 7.6 cm, slightly decreased in size from 8.3 x 8.2 cm on 05/02/2023. Along the superomedial aspect of this cyst, there is a 10 mm nodular focus (2:60). No right adnexal mass. Other: No free fluid, fluid collection, or free air. Musculoskeletal: No acute or abnormal lytic or blastic osseous lesions. Multilevel degenerative changes of the partially  imaged thoracic and lumbar spine. IMPRESSION: 1. When compared to multiple prior CT examinations, the proximal  jejunum is displaced into the right hemiabdomen on today's examination with short-segment small bowel intussusception. No current evidence of bowel obstruction. Findings may reflect mesenteric laxity. Small-bowel intussusception is most often physiologic and transient. Consider follow-up nonemergent CT enterography to assess resolution and evaluate for possible lead point. 2. No CT findings of bile duct dilation. 3. Slightly decreased size of simple fluid attenuation left adnexal cyst measuring 7.9 x 7.6 cm, previously 8.3 x 8.2 cm on 05/02/2023. Along the superomedial aspect of this cyst, there is a 10 mm nodular focus, indeterminate. Recommend further evaluation with nonemergent pelvic ultrasound examination. Electronically Signed   By: Limin  Xu M.D.   On: 02/01/2024 12:16      Assessment/Plan Abdominal pain Small bowel intussusception  Patient seen and examined and relevant labs and imaging personally reviewed.  CT scan shows the proximal jejunum displaced into the right hemiabdomen with short segment small bowel intussusception without evidence of bowel obstruction.  WBC normal and vital signs overall within normal limits - she did have hypertension on initial vitals check.  She is tender on exam but abdominal exam is overall reassuring without signs/symptoms of peritonitis.  She is not obstructed.  No urgent/emergent surgical intervention is indicated.  This intussusception may be transient.  Recommend repeat CT scan with p.o./IV contrast if possible given she has an iodinated contrast allergy and would need premedication  FEN: NPO for further imaging ID: none currently indicated VTE: okay for chemical prophylaxis from surgical standpoint  I reviewed ED provider notes, last 24 h vitals and pain scores, last 48 h intake and output, last 24 h labs and trends, and last 24 h imaging  results.   [place] VEAR Mais, [facility] Surgery 02/01/2024, 3:44 PM Please see Amion for pager number during day hours 7:00am-4:30pm

## 2024-02-01 NOTE — ED Provider Notes (Signed)
 [place] EMERGENCY DEPARTMENT AT [facility] Provider Note   CSN: [id number] Arrival date & time: 02/01/24  9195     History  Chief Complaint  Patient presents with   Abdominal Pain    Tiffany [name] is a 53 y.o. female.   Abdominal Pain 53 year old female history of chronic pain presenting for abdominal pain.  Patient states she has had pain in the right upper quadrant of her abdomen for about 2 days.  Status post appendectomy and cholecystectomy.  She has had some diarrhea as well.  She feels like there is a lump in her abdomen.  No chest pain or shortness of breath or pleuritic pain or history of DVT or PE.  No fevers or chills.  No urinary symptoms.     Home Medications Prior to Admission medications   Medication Sig Start Date End Date Taking? Authorizing Provider  ALPRAZolam  (XANAX ) 0.5 MG tablet Take 1 tablet (0.5 mg total) by mouth daily as needed for anxiety. Patient taking differently: Take 0.5 mg by mouth See admin instructions. 0.5mg  every night And 0.5mg  daily as needed for panic 10/03/16   English, Stephanie D, [place]  aspirin  81 MG chewable tablet Chew 81 mg by mouth daily.    [provider]  colchicine  0.6 MG tablet Take 1 tablet (0.6 mg total) by mouth 2 (two) times daily for 15 days. 11/16/23 12/01/23  Lue Elsie BROCKS, MD  diltiazem  (CARDIZEM  LA) 120 MG 24 hr tablet Take 120 mg by mouth daily. 09/24/22   [provider]  DULoxetine  (CYMBALTA ) 60 MG capsule Take 1 capsule (60 mg total) by mouth daily. 10/03/16   Isadora Corean BIRCH, PA  ivabradine  (CORLANOR) 7.5 MG TABS tablet Take 2 tablets (15mg ) by mouth TWO hours prior to your cardiac CT scan. 12/17/23   Loni Soyla LABOR, MD  losartan  (COZAAR ) 25 MG tablet Take 25 mg by mouth daily.    [provider]  nitroGLYCERIN  (NITROSTAT ) 0.4 MG SL tablet Place 1 tablet (0.4 mg total) under the tongue every 5 (five) minutes as needed for chest pain. 11/16/23   Lue Elsie BROCKS,  MD  predniSONE  (DELTASONE ) 50 MG tablet Take 1 tablet by mouth 13 hours, 7 hours, and 1 hour before coronary CT to decrease reaction with contrast 12/17/23   Acharya, Gayatri A, MD  Semaglutide,0.25 or 0.5MG /DOS, (OZEMPIC, 0.25 OR 0.5 MG/DOSE,) 2 MG/3ML SOPN 0.25 mg x4 weeks; then 0.5 mg x 4 weeks then 1 mg weekly thereafter 09/21/23   [provider]      Allergies    Contrast media [iodinated contrast media], Stadol [butorphanol], and Other    Review of Systems   Review of Systems  Gastrointestinal:  Positive for abdominal pain.  Review of systems completed and notable as per HPI.  ROS otherwise negative.   Physical Exam Updated Vital Signs BP 132/66   Pulse 68   Temp 97.7 F (36.5 C) (Oral)   Resp 11   Ht 5' 3 (1.6 m)   Wt 112.5 kg   SpO2 95%   BMI 43.93 kg/m  Physical Exam Vitals and nursing note reviewed.  Constitutional:      General: She is not in acute distress.    Appearance: She is well-developed.  HENT:     Head: Normocephalic and atraumatic.  Eyes:     Extraocular Movements: Extraocular movements intact.     Conjunctiva/sclera: Conjunctivae normal.     Pupils: Pupils are equal, round, and reactive to light.  Cardiovascular:     Rate and Rhythm: Normal rate and regular rhythm.     Pulses: Normal pulses.     Heart sounds: Normal heart sounds. No murmur heard. Pulmonary:     Effort: Pulmonary effort is normal. No respiratory distress.     Breath sounds: Normal breath sounds.  Abdominal:     Palpations: Abdomen is soft.     Tenderness: There is abdominal tenderness in the right upper quadrant. There is no right CVA tenderness, left CVA tenderness, guarding or rebound.     Comments: No palpable hernia, no skin changes  Musculoskeletal:        General: No swelling.     Cervical back: Neck supple.     Right lower leg: No edema.     Left lower leg: No edema.  Skin:    General: Skin is warm and dry.     Capillary Refill: Capillary refill takes less  than 2 seconds.  Neurological:     Mental Status: She is alert.  Psychiatric:        Mood and Affect: Mood normal.     ED Results / Procedures / Treatments   Labs (all labs ordered are listed, but only abnormal results are displayed) Labs Reviewed  COMPREHENSIVE METABOLIC PANEL - Abnormal; Notable for the following components:      Result Value   Glucose, Bld 136 (*)    Alkaline Phosphatase 144 (*)    All other components within normal limits  URINALYSIS, ROUTINE W REFLEX MICROSCOPIC - Abnormal; Notable for the following components:   APPearance CLOUDY (*)    Hgb urine dipstick MODERATE (*)    Leukocytes,Ua LARGE (*)    Bacteria, UA RARE (*)    All other components within normal limits  LIPASE, BLOOD  CBC  HCG, SERUM, QUALITATIVE    EKG None  Radiology CT ABDOMEN PELVIS WO CONTRAST Result Date: 02/01/2024 CLINICAL DATA:  Two day history of right upper quadrant abdominal pain EXAM: CT ABDOMEN AND PELVIS WITHOUT CONTRAST TECHNIQUE: Multidetector CT imaging of the abdomen and pelvis was performed following the standard protocol without IV contrast. RADIATION DOSE REDUCTION: This exam was performed according to the departmental dose-optimization program which includes automated exposure control, adjustment of the mA and/or kV according to patient size and/or use of iterative reconstruction technique. COMPARISON:  CT abdomen and pelvis dated 05/02/2023 and multiple priors FINDINGS: Lower chest: No focal consolidation or pulmonary nodule in the lung bases. No pleural effusion or pneumothorax demonstrated. Partially imaged heart size is normal. Hepatobiliary: Subcentimeter segment 3 hypodensity (2:25), too small to characterize, but unchanged dating back to 12/08/2021, likely benign. No intra or extrahepatic biliary ductal dilation. Cholecystectomy. Pancreas: No focal lesions or main ductal dilation. Spleen: Normal in size without focal abnormality. Adrenals/Urinary Tract: No adrenal  nodules. No suspicious renal mass on this noncontrast enhanced examination , calculi or hydronephrosis. No focal bladder wall thickening. Stomach/Bowel: Normal appearance of the stomach. When compared to multiple prior CT examinations, the proximal jejunum beyond the level of the duodenojejunal junction is displaced into the right hemiabdomen on today's examination (8:52) with short-segment small bowel telescoping (8:51). No evidence of bowel wall thickening, distention, or inflammatory changes. Appendectomy. Vascular/Lymphatic: No significant vascular findings are present. No enlarged abdominal or pelvic lymph nodes. Reproductive: Again seen is simple fluid attenuation left adnexal cyst measuring 7.9 x 7.6 cm, slightly decreased in size from 8.3 x 8.2 cm on 05/02/2023. Along the superomedial aspect of this cyst, there is a 10  mm nodular focus (2:60). No right adnexal mass. Other: No free fluid, fluid collection, or free air. Musculoskeletal: No acute or abnormal lytic or blastic osseous lesions. Multilevel degenerative changes of the partially imaged thoracic and lumbar spine. IMPRESSION: 1. When compared to multiple prior CT examinations, the proximal jejunum is displaced into the right hemiabdomen on today's examination with short-segment small bowel intussusception. No current evidence of bowel obstruction. Findings may reflect mesenteric laxity. Small-bowel intussusception is most often physiologic and transient. Consider follow-up nonemergent CT enterography to assess resolution and evaluate for possible lead point. 2. No CT findings of bile duct dilation. 3. Slightly decreased size of simple fluid attenuation left adnexal cyst measuring 7.9 x 7.6 cm, previously 8.3 x 8.2 cm on 05/02/2023. Along the superomedial aspect of this cyst, there is a 10 mm nodular focus, indeterminate. Recommend further evaluation with nonemergent pelvic ultrasound examination. Electronically Signed   By: Limin  Xu M.D.   On:  02/01/2024 12:16    Procedures Procedures    Medications Ordered in ED Medications  sodium chloride  0.9 % bolus 1,000 mL (0 mLs Intravenous Stopped 02/01/24 1344)  morphine  (PF) 4 MG/ML injection 4 mg (4 mg Intravenous Given 02/01/24 1107)  ondansetron  (ZOFRAN ) injection 4 mg (4 mg Intravenous Given 02/01/24 1108)  ketorolac  (TORADOL ) 15 MG/ML injection 15 mg (15 mg Intravenous Given 02/01/24 1212)  HYDROmorphone  (DILAUDID ) injection 1 mg (1 mg Intravenous Given 02/01/24 1446)    ED Course/ Medical Decision Making/ A&P                                 Medical Decision Making Amount and/or Complexity of Data Reviewed Labs: ordered. Radiology: ordered.  Risk Prescription drug management.   Medical Decision Making:   LUCEAL HOLLIBAUGH is a 53 y.o. female who presented to the ED today with abdominal pain in the right upper quadrant provide signs reviewed.  Exam she is quite tender in the right upper quadrant but not peritonitic.  I do not think this is referred pain from PE.  Her lab work is overall reassuring other alk phos is slightly elevated but she is status post cholecystectomy.  Urinalysis is concerning for hematuria and leukocyturia although no urinary symptoms.  Concern for possible Pilo, infected stone, biliary pathology, infection, hernia.  Obtain CT scan for evaluation.   Patient placed on continuous vitals and telemetry monitoring while in ED which was reviewed periodically.  Reviewed and confirmed nursing documentation for past medical history, family history, social history.  Reassessment and Plan:   CT scan is concerning for dissection of the small bowel.  I think this is consistent with her symptoms.  Initially was feeling better but then had recurrence of symptoms.  Given persistent pain general surgery was consulted.  I spoke with the PA.  Unfortunately patient has contrast allergy to iodinated contrast and we do not have Gastrografin or barium here I am told by radiology  technician.  General surgery is coming to see for evaluation of her intussusception.  She remains hemodynamically stable.  Handoff given to Dr. Trifan with plan to follow-up general surgery recommendations.   Patient's presentation is most consistent with acute complicated illness / injury requiring diagnostic workup.           Final Clinical Impression(s) / ED Diagnoses Final diagnoses:  Right upper quadrant abdominal pain    Rx / DC Orders ED Discharge Orders     None  Nicholaus Cassondra DEL, MD 02/01/24 1537

## 2024-02-01 NOTE — ED Triage Notes (Signed)
 Patient has had right upper quadrant abdominal pain for 2 days. Is having diarrhea. No vomiting. Has her gall bladder and appendix removed.

## 2024-02-02 ENCOUNTER — Observation Stay (HOSPITAL_COMMUNITY): Payer: BC Managed Care – PPO

## 2024-02-02 DIAGNOSIS — I471 Supraventricular tachycardia, unspecified: Secondary | ICD-10-CM | POA: Insufficient documentation

## 2024-02-02 DIAGNOSIS — R29818 Other symptoms and signs involving the nervous system: Secondary | ICD-10-CM | POA: Insufficient documentation

## 2024-02-02 DIAGNOSIS — F411 Generalized anxiety disorder: Secondary | ICD-10-CM

## 2024-02-02 DIAGNOSIS — R0789 Other chest pain: Secondary | ICD-10-CM

## 2024-02-02 DIAGNOSIS — I10 Essential (primary) hypertension: Secondary | ICD-10-CM | POA: Insufficient documentation

## 2024-02-02 DIAGNOSIS — N83202 Unspecified ovarian cyst, left side: Secondary | ICD-10-CM | POA: Diagnosis not present

## 2024-02-02 DIAGNOSIS — K561 Intussusception: Secondary | ICD-10-CM

## 2024-02-02 LAB — TROPONIN I (HIGH SENSITIVITY): Troponin I (High Sensitivity): 3 ng/L (ref <18)

## 2024-02-02 MED ORDER — PANTOPRAZOLE SODIUM 40 MG PO TBEC
40.0000 mg | DELAYED_RELEASE_TABLET | Freq: Every day | ORAL | Status: DC
  Administered 2024-02-03 – 2024-02-05 (×3): 40 mg via ORAL
  Filled 2024-02-02 (×3): qty 1

## 2024-02-02 MED ORDER — NITROGLYCERIN 0.4 MG SL SUBL: 0.4000 mg | SUBLINGUAL_TABLET | SUBLINGUAL | Status: DC | PRN

## 2024-02-02 MED ORDER — PANTOPRAZOLE SODIUM 40 MG IV SOLR
40.0000 mg | Freq: Once | INTRAVENOUS | Status: AC
  Administered 2024-02-02: 40 mg via INTRAVENOUS
  Filled 2024-02-02: qty 10

## 2024-02-02 MED ORDER — IOHEXOL 300 MG/ML  SOLN
100.0000 mL | Freq: Once | INTRAMUSCULAR | Status: AC | PRN
  Administered 2024-02-02: 100 mL via INTRAVENOUS

## 2024-02-02 MED ORDER — ALUM & MAG HYDROXIDE-SIMETH 200-200-20 MG/5ML PO SUSP
30.0000 mL | Freq: Four times a day (QID) | ORAL | Status: DC | PRN
  Administered 2024-02-02: 30 mL via ORAL
  Filled 2024-02-02: qty 30

## 2024-02-02 NOTE — Assessment & Plan Note (Signed)
 Patient complained of dizziness and numbness of the right side of her body on 2 separate occasions in the past week. Patient did not report the symptoms on her presentation to our emergency department on 2/7 nor did she report the symptoms when she presented to an [facility] urgent care clinic on 2/4 complaining of back pain.   Neurologically patient is unremarkable at this point We will obtain CT head, monitor overnight.

## 2024-02-02 NOTE — Progress Notes (Signed)
 Pt c/o sharp chest pain, getting vitals and EKG. Md notified

## 2024-02-02 NOTE — Assessment & Plan Note (Signed)
 Patient reporting some improvement in her abdominal pain since yesterday CT imaging the abdomen pelvis with contrast revealing short segment small bowel intussusception in the left abdomen.  Considering location of the intussusception compared to the previous scan and the lack of correlation between the location of symptoms and CT findings the clinical significance is unclear. Case discussed with surgery as patient is currently requesting advancement of diet.  Surgery recommends advancing and monitoring overnight. As needed opiate-based analgesics for associated pain

## 2024-02-02 NOTE — Progress Notes (Signed)
 Paged MD, pt asking if nothing is going to be done, she'd like to go home and she's also asking about food. thanks

## 2024-02-02 NOTE — Progress Notes (Signed)
 Maalox given.  EKG NSR.  MD notified.  Pt reported to me she's had a complete list of stroke symptoms this past week twice.  Dizzy, couldn't walk in a line, trouble swallowing, blurred vision, and complete R side of her body numb.  This resolved then happened again on Thursday this week.  Pt educated on not withholding important information about her health.  MD has been notified

## 2024-02-02 NOTE — Assessment & Plan Note (Signed)
 Longstanding history of known large cyst of left ovary Continue outpatient follow-up

## 2024-02-02 NOTE — Hospital Course (Addendum)
 Brief Narrative:   53 y.o. female with medical history significant for anxiety and depression, SVT, complex regional pain syndrome and arthritis presented to [facility] emergency department with complaints of abdominal pain.   Upon evaluation in the emergency department CT imaging of the abdomen revealed evidence of small genial displacement into the right hemiabdomen with short segment small bowel intussusception but no bowel obstruction.  Hospitalist group was then called to assess patient for admission the hospital.   General surgery was consulted and recommended observation in the hospital with repeat CT imaging of the abdomen the following day.  Patient was placed on a clear liquid diet and as needed opiate-based analgesics for pain.   Hospital course has been complicated by bouts of atypical chest pain and complaints of intermittent right-sided neurologic complaints such as numbness and weakness that has been ongoing for over a week.  Patient went diagnostic laparoscopy which showed left-sided ovarian cyst otherwise unremarkable.  Antibiotics were discontinued.  For atypical headache, she underwent MRI which was negative.  B12, folate was normal.  Vitamin D  deficiency therefore started on supplements  Stable for discharge today.  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  General surgery ended up performing diagnostic laparoscopy which showed ovarian cyst in the left lower quadrant otherwise no other acute findings.   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  Previous provider  discussed case with neurology.  CT scan is negative.  Prior MRI and EEG have been unremarkable. Mri Brain again this time is negative -B12, folate= normal.   Urinary tract infection - Cultures not sent.  Already on Zosyn .  Vitamin D  deficiency - Supplements started  Atypical chest pain Received Maalox and resolved her symptoms  Generalized anxiety disorder Continue home regimen of duloxetine  and Xanax   Essential hypertension Continue home regimen of Cozaar  and Cardizem  IV as needed  SVT (supraventricular tachycardia) (HCC) Cardizem .  Currently normal sinus rhythm  Cyst of left ovary Longstanding history of known large cyst of left ovary Continue outpatient follow-up  DVT prophylaxis: enoxaparin  (LOVENOX ) injection 40 mg Start: 02/01/24 2200    Code Status: Full Code Family Communication:   Status is: Inpatient Remains inpatient appropriate because: Discharge to  Subjective: Seen at bedside no complaints.  Wishing to go home today.  Examination:  General exam: Appears calm and comfortable  Respiratory system: Clear to auscultation. Respiratory effort normal. Cardiovascular system: S1 & S2 heard, RRR. No JVD, murmurs, rubs, gallops or clicks. No pedal edema. Gastrointestinal system: Abdomen is nondistended, soft and nontender. No organomegaly or masses felt. Normal bowel sounds heard. Central nervous system: Alert and oriented.  Slight weak as of her right upper extremity. Extremities: Symmetric 5 x 5 power. Skin: No rashes, lesions or ulcers Psychiatry: Judgement and insight appear normal. Mood & affect appropriate.

## 2024-02-02 NOTE — Progress Notes (Signed)
 Subjective/Chief Complaint: Still complains of RUQ pain   Objective: Vital signs in last 24 hours: Temp:  [97.5 F (36.4 C)-98.7 F (37.1 C)] 98.7 F (37.1 C) (02/08 0844) Pulse Rate:  [61-86] 86 (02/08 0844) Resp:  [11-22] 16 (02/08 0844) BP: (108-141)/(58-99) 123/75 (02/08 0844) SpO2:  [91 %-100 %] 95 % (02/08 0844)    Intake/Output from previous day: 02/07 0701 - 02/08 0700 In: 1000 [IV Piggyback:1000] Out: -  Intake/Output this shift: No intake/output data recorded.  General appearance: alert and cooperative Resp: clear to auscultation bilaterally Cardio: regular rate and rhythm GI: soft, mild RUQ pain. No distension  Lab Results:  Recent Labs    02/01/24 0820  WBC 7.7  HGB 14.8  HCT 44.4  PLT 258   BMET Recent Labs    02/01/24 0820  NA 138  K 4.0  CL 106  CO2 23  GLUCOSE 136*  BUN 11  CREATININE 0.77  CALCIUM 8.9   PT/INR No results for input(s): LABPROT, INR in the last 72 hours. ABG No results for input(s): PHART, HCO3 in the last 72 hours.  Invalid input(s): PCO2, PO2  Studies/Results: CT ABDOMEN PELVIS WO CONTRAST Result Date: 02/01/2024 CLINICAL DATA:  Two day history of right upper quadrant abdominal pain EXAM: CT ABDOMEN AND PELVIS WITHOUT CONTRAST TECHNIQUE: Multidetector CT imaging of the abdomen and pelvis was performed following the standard protocol without IV contrast. RADIATION DOSE REDUCTION: This exam was performed according to the departmental dose-optimization program which includes automated exposure control, adjustment of the mA and/or kV according to patient size and/or use of iterative reconstruction technique. COMPARISON:  CT abdomen and pelvis dated 05/02/2023 and multiple priors FINDINGS: Lower chest: No focal consolidation or pulmonary nodule in the lung bases. No pleural effusion or pneumothorax demonstrated. Partially imaged heart size is normal. Hepatobiliary: Subcentimeter segment 3 hypodensity (2:25), too  small to characterize, but unchanged dating back to 12/08/2021, likely benign. No intra or extrahepatic biliary ductal dilation. Cholecystectomy. Pancreas: No focal lesions or main ductal dilation. Spleen: Normal in size without focal abnormality. Adrenals/Urinary Tract: No adrenal nodules. No suspicious renal mass on this noncontrast enhanced examination , calculi or hydronephrosis. No focal bladder wall thickening. Stomach/Bowel: Normal appearance of the stomach. When compared to multiple prior CT examinations, the proximal jejunum beyond the level of the duodenojejunal junction is displaced into the right hemiabdomen on today's examination (8:52) with short-segment small bowel telescoping (8:51). No evidence of bowel wall thickening, distention, or inflammatory changes. Appendectomy. Vascular/Lymphatic: No significant vascular findings are present. No enlarged abdominal or pelvic lymph nodes. Reproductive: Again seen is simple fluid attenuation left adnexal cyst measuring 7.9 x 7.6 cm, slightly decreased in size from 8.3 x 8.2 cm on 05/02/2023. Along the superomedial aspect of this cyst, there is a 10 mm nodular focus (2:60). No right adnexal mass. Other: No free fluid, fluid collection, or free air. Musculoskeletal: No acute or abnormal lytic or blastic osseous lesions. Multilevel degenerative changes of the partially imaged thoracic and lumbar spine. IMPRESSION: 1. When compared to multiple prior CT examinations, the proximal jejunum is displaced into the right hemiabdomen on today's examination with short-segment small bowel intussusception. No current evidence of bowel obstruction. Findings may reflect mesenteric laxity. Small-bowel intussusception is most often physiologic and transient. Consider follow-up nonemergent CT enterography to assess resolution and evaluate for possible lead point. 2. No CT findings of bile duct dilation. 3. Slightly decreased size of simple fluid attenuation left adnexal cyst  measuring 7.9 x 7.6  cm, previously 8.3 x 8.2 cm on 05/02/2023. Along the superomedial aspect of this cyst, there is a 10 mm nodular focus, indeterminate. Recommend further evaluation with nonemergent pelvic ultrasound examination. Electronically Signed   By: Limin  Xu M.D.   On: 02/01/2024 12:16    Anti-infectives: Anti-infectives (From admission, onward)    None       Assessment/Plan: s/p * No surgery found * Intussusception. Will repeat CT today Admit to medicine Npo for now Will follow  LOS: 0 days    Deward Null III 02/02/2024

## 2024-02-02 NOTE — Assessment & Plan Note (Signed)
 Patient experienced an episode of midsternal atypical chest discomfort this evening after advancing diet EKG unremarkable Review of records reveal that patient had an extensive workup for atypical chest pain in November 2024 including coronary CTA with clean coronary arteries. Maalox administered for suspected esophageal spasm

## 2024-02-02 NOTE — Assessment & Plan Note (Signed)
 Continue home regimen of Cozaar 

## 2024-02-02 NOTE — Plan of Care (Signed)

## 2024-02-02 NOTE — Assessment & Plan Note (Signed)
 Continue home regimen of duloxetine  and Xanax 

## 2024-02-02 NOTE — Progress Notes (Signed)
 Daughter stated pt has nonepileptic seizure when patient is easily startled in her sleep or from bright lights or noises.

## 2024-02-02 NOTE — Assessment & Plan Note (Signed)
 History of SVT Currently normal sinus rhythm Continue home regimen of diltiazem 

## 2024-02-02 NOTE — Progress Notes (Signed)
 PROGRESS NOTE   Tiffany [name]  FMW:[id number] DOB: Dec 04, 1971 DOA: 02/01/2024 PCP: Windle Farrel DEL, FNP   Date of Service: the patient was seen and examined on 02/02/2024  Brief Narrative:  53 y.o. female with medical history significant for anxiety and depression, SVT, complex regional pain syndrome and arthritis presented to [facility] emergency department with complaints of abdominal pain.  Upon evaluation in the emergency department CT imaging of the abdomen revealed evidence of small genial displacement into the right hemiabdomen with short segment small bowel intussusception but no bowel obstruction.  Hospitalist group was then called to assess patient for admission the hospital.  General surgery was consulted and recommended observation in the hospital with repeat CT imaging of the abdomen the following day.  Patient was placed on a clear liquid diet and as needed opiate-based analgesics for pain.   Assessment & Plan Small bowel intussusception [facility]) Patient reporting some improvement in her abdominal pain since yesterday CT imaging the abdomen pelvis with contrast revealing short segment small bowel intussusception in the left abdomen.  Considering location of the intussusception compared to the previous scan and the lack of correlation between the location of symptoms and CT findings the clinical significance is unclear. Case discussed with surgery as patient is currently requesting advancement of diet.  Surgery recommends advancing and monitoring overnight. As needed opiate-based analgesics for associated pain Atypical chest pain Patient experienced an episode of midsternal atypical chest discomfort this evening after advancing diet EKG unremarkable Review of records reveal that patient had an extensive workup for atypical chest pain in November 2024 including coronary CTA with clean coronary arteries. Maalox administered for suspected esophageal spasm Generalized anxiety  disorder Continue home regimen of duloxetine  and Xanax  Essential hypertension Continue home regimen of Cozaar  SVT (supraventricular tachycardia) (HCC) History of SVT Currently normal sinus rhythm Continue home regimen of diltiazem  Neurologic abnormality Patient complained of dizziness and numbness of the right side of her body on 2 separate occasions in the past week. Patient did not report the symptoms on her presentation to our emergency department on 2/7 nor did she report the symptoms when she presented to an [facility] urgent care clinic on 2/4 complaining of back pain.   Neurologically patient is unremarkable at this point We will obtain CT head, monitor overnight. Cyst of left ovary Longstanding history of known large cyst of left ovary Continue outpatient follow-up   Subjective: Patient currently complaining of abdominal pain, moderate intensity, located in the right upper quadrant, sharp in quality.   Physical Exam:  Vitals:   02/02/24 0357 02/02/24 0400 02/02/24 0615 02/02/24 0844  BP: 119/62 (!) 108/58 111/66 123/75  Pulse: 76 65 71 86  Resp: (!) 21 16 16 16   Temp: 98.7 F (37.1 C)   98.7 F (37.1 C)  TempSrc:    Oral  SpO2: 96% 95% 95% 95%  Weight:      Height:        Constitutional: Awake alert and oriented x3, no associated distress.   Skin: no rashes, no lesions, good skin turgor noted. Eyes: Pupils are equally reactive to light.  No evidence of scleral icterus or conjunctival pallor.  ENMT: Moist mucous membranes noted.  Posterior pharynx clear of any exudate or lesions.   Respiratory: clear to auscultation bilaterally, no wheezing, no crackles. Normal respiratory effort. No accessory muscle use.  Cardiovascular: Regular rate and rhythm, no murmurs / rubs / gallops. No extremity edema. 2+ pedal pulses. No carotid bruits.  Abdomen:  Generalized abdominal tenderness.  Abdomen is soft.  No evidence of intra-abdominal masses.  Positive bowel sounds noted in  all quadrants.   Musculoskeletal: No joint deformity upper and lower extremities. Good ROM, no contractures. Normal muscle tone.    Data Reviewed:  I have personally reviewed and interpreted labs, imaging.  Significant findings are   CBC: Recent Labs  Lab 02/01/24 0820  WBC 7.7  HGB 14.8  HCT 44.4  MCV 93.9  PLT 258   Basic Metabolic Panel: Recent Labs  Lab 02/01/24 0820  NA 138  K 4.0  CL 106  CO2 23  GLUCOSE 136*  BUN 11  CREATININE 0.77  CALCIUM 8.9   GFR: Estimated Creatinine Clearance: 99.2 mL/min (by C-G formula based on SCr of 0.77 mg/dL). Liver Function Tests: Recent Labs  Lab 02/01/24 0820  AST 18  ALT 15  ALKPHOS 144*  BILITOT 0.9  PROT 6.9  ALBUMIN 3.8    Coagulation Profile: No results for input(s): INR, PROTIME in the last 168 hours.   EKG/Telemetry: Personally reviewed.  Rhythm is normal sinus rhythm with heart rate of 96 bpm.  No dynamic ST segment changes appreciated.   Code Status:  Full code.  Code status decision has been confirmed with: patient Family Communication: Spouse and son are at the bedside and have been updated on plan of care.   Severity of Illness:  The appropriate patient status for this patient is OBSERVATION. Observation status is judged to be reasonable and necessary in order to provide the required intensity of service to ensure the patient's safety. The patient's presenting symptoms, physical exam findings, and initial radiographic and laboratory data in the context of their medical condition is felt to place them at decreased risk for further clinical deterioration. Furthermore, it is anticipated that the patient will be medically stable for discharge from the hospital within 2 midnights of admission.   Time spent:  50 minutes  Author:  Zachary JINNY Ba MD  02/02/2024 10:03 AM

## 2024-02-03 ENCOUNTER — Observation Stay (HOSPITAL_COMMUNITY): Payer: BC Managed Care – PPO

## 2024-02-03 DIAGNOSIS — M1990 Unspecified osteoarthritis, unspecified site: Secondary | ICD-10-CM | POA: Diagnosis present

## 2024-02-03 DIAGNOSIS — F411 Generalized anxiety disorder: Secondary | ICD-10-CM | POA: Diagnosis present

## 2024-02-03 DIAGNOSIS — K219 Gastro-esophageal reflux disease without esophagitis: Secondary | ICD-10-CM | POA: Diagnosis present

## 2024-02-03 DIAGNOSIS — Z91041 Radiographic dye allergy status: Secondary | ICD-10-CM | POA: Diagnosis not present

## 2024-02-03 DIAGNOSIS — I471 Supraventricular tachycardia, unspecified: Secondary | ICD-10-CM | POA: Diagnosis present

## 2024-02-03 DIAGNOSIS — F32A Depression, unspecified: Secondary | ICD-10-CM | POA: Diagnosis present

## 2024-02-03 DIAGNOSIS — Z6841 Body Mass Index (BMI) 40.0 and over, adult: Secondary | ICD-10-CM | POA: Diagnosis not present

## 2024-02-03 DIAGNOSIS — D351 Benign neoplasm of parathyroid gland: Secondary | ICD-10-CM | POA: Diagnosis present

## 2024-02-03 DIAGNOSIS — K561 Intussusception: Secondary | ICD-10-CM | POA: Diagnosis present

## 2024-02-03 DIAGNOSIS — N390 Urinary tract infection, site not specified: Secondary | ICD-10-CM | POA: Diagnosis present

## 2024-02-03 DIAGNOSIS — I10 Essential (primary) hypertension: Secondary | ICD-10-CM | POA: Diagnosis present

## 2024-02-03 DIAGNOSIS — Z806 Family history of leukemia: Secondary | ICD-10-CM | POA: Diagnosis not present

## 2024-02-03 DIAGNOSIS — Z8249 Family history of ischemic heart disease and other diseases of the circulatory system: Secondary | ICD-10-CM | POA: Diagnosis not present

## 2024-02-03 DIAGNOSIS — N83292 Other ovarian cyst, left side: Secondary | ICD-10-CM | POA: Diagnosis present

## 2024-02-03 DIAGNOSIS — R519 Headache, unspecified: Secondary | ICD-10-CM | POA: Diagnosis present

## 2024-02-03 DIAGNOSIS — E66813 Obesity, class 3: Secondary | ICD-10-CM | POA: Diagnosis present

## 2024-02-03 DIAGNOSIS — Z818 Family history of other mental and behavioral disorders: Secondary | ICD-10-CM | POA: Diagnosis not present

## 2024-02-03 DIAGNOSIS — R0789 Other chest pain: Secondary | ICD-10-CM | POA: Diagnosis present

## 2024-02-03 DIAGNOSIS — Z87891 Personal history of nicotine dependence: Secondary | ICD-10-CM | POA: Diagnosis not present

## 2024-02-03 DIAGNOSIS — R29818 Other symptoms and signs involving the nervous system: Secondary | ICD-10-CM | POA: Diagnosis present

## 2024-02-03 DIAGNOSIS — Z8051 Family history of malignant neoplasm of kidney: Secondary | ICD-10-CM | POA: Diagnosis not present

## 2024-02-03 DIAGNOSIS — N83202 Unspecified ovarian cyst, left side: Secondary | ICD-10-CM | POA: Diagnosis not present

## 2024-02-03 DIAGNOSIS — R823 Hemoglobinuria: Secondary | ICD-10-CM | POA: Diagnosis present

## 2024-02-03 DIAGNOSIS — E559 Vitamin D deficiency, unspecified: Secondary | ICD-10-CM | POA: Diagnosis present

## 2024-02-03 DIAGNOSIS — M67431 Ganglion, right wrist: Secondary | ICD-10-CM | POA: Diagnosis present

## 2024-02-03 LAB — COMPREHENSIVE METABOLIC PANEL
ALT: 30 U/L (ref 0–44)
AST: 19 U/L (ref 15–41)
Albumin: 3.5 g/dL (ref 3.5–5.0)
Alkaline Phosphatase: 154 U/L — ABNORMAL HIGH (ref 38–126)
Anion gap: 8 (ref 5–15)
BUN: 10 mg/dL (ref 6–20)
CO2: 25 mmol/L (ref 22–32)
Calcium: 8.8 mg/dL — ABNORMAL LOW (ref 8.9–10.3)
Chloride: 108 mmol/L (ref 98–111)
Creatinine, Ser: 0.72 mg/dL (ref 0.44–1.00)
GFR, Estimated: >60 mL/min (ref >60)
Glucose, Bld: 126 mg/dL — ABNORMAL HIGH (ref 70–99)
Potassium: 3.4 mmol/L — ABNORMAL LOW (ref 3.5–5.1)
Sodium: 141 mmol/L (ref 135–145)
Total Bilirubin: 0.5 mg/dL (ref 0.0–1.2)
Total Protein: 6.5 g/dL (ref 6.5–8.1)

## 2024-02-03 LAB — CBC WITH DIFFERENTIAL/PLATELET
Abs Immature Granulocytes: 0.14 10*3/uL — ABNORMAL HIGH (ref 0.00–0.07)
Basophils Absolute: 0 10*3/uL (ref 0.0–0.1)
Basophils Relative: 0 %
Eosinophils Absolute: 0 10*3/uL (ref 0.0–0.5)
Eosinophils Relative: 0 %
HCT: 43.3 % (ref 36.0–46.0)
Hemoglobin: 14.3 g/dL (ref 12.0–15.0)
Immature Granulocytes: 1 %
Lymphocytes Relative: 15 %
Lymphs Abs: 2.4 10*3/uL (ref 0.7–4.0)
MCH: 30.7 pg (ref 26.0–34.0)
MCHC: 33 g/dL (ref 30.0–36.0)
MCV: 92.9 fL (ref 80.0–100.0)
Monocytes Absolute: 0.9 10*3/uL (ref 0.1–1.0)
Monocytes Relative: 5 %
Neutro Abs: 12.9 10*3/uL — ABNORMAL HIGH (ref 1.7–7.7)
Neutrophils Relative %: 79 %
Platelets: 266 10*3/uL (ref 150–400)
RBC: 4.66 MIL/uL (ref 3.87–5.11)
RDW: 13.1 % (ref 11.5–15.5)
WBC: 16.4 10*3/uL — ABNORMAL HIGH (ref 4.0–10.5)
nRBC: 0 % (ref 0.0–0.2)

## 2024-02-03 LAB — URINALYSIS, W/ REFLEX TO CULTURE (INFECTION SUSPECTED)
Bilirubin Urine: NEGATIVE
Glucose, UA: NEGATIVE mg/dL
Ketones, ur: NEGATIVE mg/dL
Nitrite: NEGATIVE
Protein, ur: NEGATIVE mg/dL
Specific Gravity, Urine: 1.025 (ref 1.005–1.030)
WBC, UA: >50 WBC/hpf (ref 0–5)
pH: 5 (ref 5.0–8.0)

## 2024-02-03 LAB — LIPID PANEL
Cholesterol: 169 mg/dL (ref 0–200)
HDL: 43 mg/dL (ref >40)
LDL Cholesterol: 108 mg/dL — ABNORMAL HIGH (ref 0–99)
Total CHOL/HDL Ratio: 3.9 {ratio}
Triglycerides: 89 mg/dL (ref <150)
VLDL: 18 mg/dL (ref 0–40)

## 2024-02-03 LAB — MAGNESIUM: Magnesium: 2.4 mg/dL (ref 1.7–2.4)

## 2024-02-03 MED ORDER — PIPERACILLIN-TAZOBACTAM 3.375 G IVPB
3.3750 g | Freq: Three times a day (TID) | INTRAVENOUS | Status: DC
  Administered 2024-02-03 – 2024-02-05 (×6): 3.375 g via INTRAVENOUS
  Filled 2024-02-03 (×5): qty 50

## 2024-02-03 MED ORDER — POTASSIUM CHLORIDE 20 MEQ PO PACK
40.0000 meq | PACK | Freq: Once | ORAL | Status: AC
  Administered 2024-02-03: 40 meq via ORAL
  Filled 2024-02-03 (×2): qty 2

## 2024-02-03 NOTE — Plan of Care (Signed)

## 2024-02-03 NOTE — Assessment & Plan Note (Signed)
 Continue home regimen of duloxetine  and Xanax 

## 2024-02-03 NOTE — Assessment & Plan Note (Signed)
 History of SVT Currently normal sinus rhythm Continue home regimen of diltiazem 

## 2024-02-03 NOTE — Progress Notes (Addendum)
 PROGRESS NOTE   Tiffany [name]  FMW:[id number] DOB: 14-May-1971 DOA: 02/01/2024 PCP: Windle Farrel DEL, FNP   Date of Service: the patient was seen and examined on 02/03/2024  Brief Narrative:  53 y.o. female with medical history significant for anxiety and depression, SVT, complex regional pain syndrome and arthritis presented to [facility] emergency department with complaints of abdominal pain.  Upon evaluation in the emergency department CT imaging of the abdomen revealed evidence of small genial displacement into the right hemiabdomen with short segment small bowel intussusception but no bowel obstruction.  Hospitalist group was then called to assess patient for admission the hospital.  General surgery was consulted and recommended observation in the hospital with repeat CT imaging of the abdomen the following day.  Patient was placed on a clear liquid diet and as needed opiate-based analgesics for pain.  Hospital course has been complicated by bouts of atypical chest pain and complaints of intermittent right-sided neurologic complaints such as numbness and weakness that has been ongoing for over a week.   Assessment & Plan Small bowel intussusception [facility]) Patient reporting some improvement in her abdominal pain since yesterday however symptoms still persisting.   CT imaging the abdomen pelvis with contrast revealing short segment small bowel intussusception in the left abdomen.  Considering location of the intussusception compared to the previous scan and the lack of correlation between the location of symptoms and CT findings the clinical significance is unclear. 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 Jerrie as patient's MRI and EEG were unremarkable. Patient was to follow-up as an outpatient at that time Upon a lengthy discussion with the patient about her symptoms we agreed to obtain an MRI of the brain with and without contrast to identify any evidence of recent stroke or MS lesions and if this was negative patient is to follow-up with neurology. Atypical chest pain Patient experienced an episode of atypical midsternal chest discomfort evening of 2/8 EKG unremarkable Review of records reveal that patient had an extensive workup for atypical chest pain in November 2024 including coronary CTA with clean coronary arteries. Maalox administered for suspected esophageal spasm with resolution of symptoms Generalized anxiety disorder Continue home regimen of duloxetine  and Xanax  Essential hypertension Continue home regimen of Cozaar  SVT (supraventricular tachycardia) (HCC) History of SVT Currently normal sinus rhythm Continue home regimen of diltiazem  Cyst of left ovary Longstanding history of known large cyst of left ovary Continue outpatient follow-up   Subjective: Patient currently complaining of continued abdominal pain, moderate intensity, located in the right upper quadrant, sharp in quality.  Patient reports that her pain is worse whenever she tries to eat.   Physical Exam:  Vitals:   02/02/24 1950 02/02/24 2340 02/03/24 0509 02/03/24 0914  BP: 132/70 (!) 114/58 121/70 139/71  Pulse: 98 85 79 (!) 57  Resp: 19 17 17 18   Temp: 97.7 F (36.5 C) 97.9 F (36.6 C) 98 F (36.7 C) 98 F (36.7 C)  TempSrc: Oral Oral Oral Oral  SpO2: 98% 100% 98% 99%  Weight:  Height:        Constitutional:  Awake alert and oriented x3, no associated distress.   Skin: no rashes, no lesions, good skin turgor noted. Eyes: Pupils are equally reactive to light.  No evidence of scleral icterus or conjunctival pallor.  ENMT: Moist mucous membranes noted.  Posterior pharynx clear of any exudate or lesions.   Respiratory: clear to auscultation bilaterally, no wheezing, no crackles. Normal respiratory effort. No accessory muscle use.  Cardiovascular: Regular rate and rhythm, no murmurs / rubs / gallops. No extremity edema. 2+ pedal pulses. No carotid bruits.  Abdomen: Generalized abdominal tenderness.  Abdomen is soft.  No evidence of intra-abdominal masses.  Positive bowel sounds noted in all quadrants.   Musculoskeletal: No joint deformity upper and lower extremities. Good ROM, no contractures. Normal muscle tone.  Neuro:  Minimal weakness of the right upper and lower extremities although exam findings are not consistent. Reported right side numbness on sensory exam.  Following all commands.  All cranial nerves intact.     Data Reviewed:  I have personally reviewed and interpreted labs, imaging.  Significant findings are   CBC: Recent Labs  Lab 02/01/24 0820 02/03/24 0505  WBC 7.7 16.4*  NEUTROABS  --  12.9*  HGB 14.8 14.3  HCT 44.4 43.3  MCV 93.9 92.9  PLT 258 266   Basic Metabolic Panel: Recent Labs  Lab 02/01/24 0820 02/03/24 0505  NA 138 141  K 4.0 3.4*  CL 106 108  CO2 23 25  GLUCOSE 136* 126*  BUN 11 10  CREATININE 0.77 0.72  CALCIUM 8.9 8.8*  MG  --  2.4   GFR: Estimated Creatinine Clearance: 99.2 mL/min (by C-G formula based on SCr of 0.72 mg/dL). Liver Function Tests: Recent Labs  Lab 02/01/24 0820 02/03/24 0505  AST 18 19  ALT 15 30  ALKPHOS 144* 154*  BILITOT 0.9 0.5  PROT 6.9 6.5  ALBUMIN 3.8 3.5    Coagulation Profile: No results for input(s): INR, PROTIME in the last 168 hours.   EKG/Telemetry: Personally reviewed.  Rhythm is normal sinus rhythm  with heart rate of 96 bpm.  No dynamic ST segment changes appreciated.   Code Status:  Full code.  Code status decision has been confirmed with: patient Family Communication: Spouse is at bedside and hasbeen updated on plan of care.   Severity of Illness:  The appropriate patient status for this patient is OBSERVATION. Observation status is judged to be reasonable and necessary in order to provide the required intensity of service to ensure the patient's safety. The patient's presenting symptoms, physical exam findings, and initial radiographic and laboratory data in the context of their medical condition is felt to place them at decreased risk for further clinical deterioration. Furthermore, it is anticipated that the patient will be medically stable for discharge from the hospital within 2 midnights of admission.   Time spent:  52 minutes  Author:  Zachary JINNY Ba MD  02/03/2024 9:14 AM

## 2024-02-03 NOTE — Assessment & Plan Note (Addendum)
 Patient reporting some improvement in her abdominal pain since yesterday however symptoms still persisting.   CT imaging the abdomen pelvis with contrast revealing short segment small bowel intussusception in the left abdomen.  Considering location of the intussusception compared to the previous scan and the lack of correlation between the location of symptoms and CT findings the clinical significance is unclear. Recommending continued clear liquid diet for now with continued inpatient monitoring As needed opiate-based analgesics for associated pain

## 2024-02-03 NOTE — Assessment & Plan Note (Addendum)
 Patient experienced an episode of atypical midsternal chest discomfort evening of 2/8 EKG unremarkable Review of records reveal that patient had an extensive workup for atypical chest pain in November 2024 including coronary CTA with clean coronary arteries. Maalox administered for suspected esophageal spasm with resolution of symptoms

## 2024-02-03 NOTE — Plan of Care (Signed)
  Problem: Clinical Measurements: Goal: Ability to maintain clinical measurements within normal limits will improve Outcome: Progressing Goal: Diagnostic test results will improve Outcome: Progressing Goal: Respiratory complications will improve Outcome: Progressing Goal: Cardiovascular complication will be avoided Outcome: Progressing   Problem: Pain Managment: Goal: General experience of comfort will improve and/or be controlled Outcome: Progressing

## 2024-02-03 NOTE — Assessment & Plan Note (Signed)
 Longstanding history of known large cyst of left ovary Continue outpatient follow-up

## 2024-02-03 NOTE — Assessment & Plan Note (Deleted)
 Patient complained of dizziness and numbness of the right side of her body on 2 separate occasions in the past week. Patient did not report the symptoms on her presentation to our emergency department on 2/7 nor did she report the symptoms when she presented to an [facility] urgent care clinic on 2/4 complaining of back pain.   Neurologically patient is unremarkable at this point We will obtain CT head, monitor overnight.

## 2024-02-03 NOTE — Assessment & Plan Note (Signed)
 Continue home regimen of Cozaar 

## 2024-02-03 NOTE — Progress Notes (Addendum)
 Subjective/Chief Complaint: Abdominal pain seems better. She also reports recent episodes of right sided numbness and difficulty with vision. CT head last night neg   Objective: Vital signs in last 24 hours: Temp:  [97.7 F (36.5 C)-98.7 F (37.1 C)] 98 F (36.7 C) (02/09 0509) Pulse Rate:  [79-98] 79 (02/09 0509) Resp:  [16-19] 17 (02/09 0509) BP: (114-135)/(58-75) 121/70 (02/09 0509) SpO2:  [95 %-100 %] 98 % (02/09 0509) Last BM Date : 01/31/24  Intake/Output from previous day: 02/08 0701 - 02/09 0700 In: 120 [P.O.:120] Out: -  Intake/Output this shift: No intake/output data recorded.  General appearance: alert and cooperative Resp: clear to auscultation bilaterally Cardio: regular rate and rhythm GI: soft, mild upper abd tenderness. No guarding or peritonitis. No obstruction  Lab Results:  Recent Labs    02/01/24 0820 02/03/24 0505  WBC 7.7 16.4*  HGB 14.8 14.3  HCT 44.4 43.3  PLT 258 266   BMET Recent Labs    02/01/24 0820 02/03/24 0505  NA 138 141  K 4.0 3.4*  CL 106 108  CO2 23 25  GLUCOSE 136* 126*  BUN 11 10  CREATININE 0.77 0.72  CALCIUM 8.9 8.8*   PT/INR No results for input(s): LABPROT, INR in the last 72 hours. ABG No results for input(s): PHART, HCO3 in the last 72 hours.  Invalid input(s): PCO2, PO2  Studies/Results: CT HEAD WO CONTRAST ( ) Result Date: 02/03/2024 CLINICAL DATA:  Initial evaluation for acute TIA. EXAM: CT HEAD WITHOUT CONTRAST TECHNIQUE: Contiguous axial images were obtained from the base of the skull through the vertex without intravenous contrast. RADIATION DOSE REDUCTION: This exam was performed according to the departmental dose-optimization program which includes automated exposure control, adjustment of the mA and/or kV according to patient size and/or use of iterative reconstruction technique. COMPARISON:  None Available. FINDINGS: Brain: Cerebral volume within normal limits for patient age. No acute  intracranial hemorrhage. No acute large vessel territory infarct. No mass lesion, midline shift, or mass effect. Ventricles are normal in size without hydrocephalus. No extra-axial fluid collection. Mild 5 mm cerebellar tonsillar ectopia without overt Chiari malformation. Vascular: No abnormal hyperdense vessel. Skull: Scalp soft tissues demonstrate no acute abnormality. Calvarium intact. Sinuses/Orbits: Globes and orbital soft tissues within normal limits. Visualized paranasal sinuses are largely clear. No significant mastoid effusion. IMPRESSION: 1. No acute intracranial abnormality. 2. Mild 5 mm cerebellar tonsillar ectopia without frank Chiari malformation. Electronically Signed   By: Morene Hoard M.D.   On: 02/03/2024 00:49   CT ABDOMEN PELVIS W CONTRAST Result Date: 02/02/2024 CLINICAL DATA:  Follow-up small bowel intussusception, abdominal pain EXAM: CT ABDOMEN AND PELVIS WITH CONTRAST TECHNIQUE: Multidetector CT imaging of the abdomen and pelvis was performed using the standard protocol following bolus administration of intravenous contrast. RADIATION DOSE REDUCTION: This exam was performed according to the departmental dose-optimization program which includes automated exposure control, adjustment of the mA and/or kV according to patient size and/or use of iterative reconstruction technique. CONTRAST:  OMNIPAQUE  IOHEXOL  300 MG/ML  SOLN COMPARISON:  02/01/2024 CT abdomen/pelvis FINDINGS: Lower chest: No significant pulmonary nodules or acute consolidative airspace disease. Hepatobiliary: Normal liver size. No liver mass. Cholecystectomy. No biliary ductal dilatation. Pancreas: Normal, with no mass or duct dilation. Spleen: Normal size. No mass. Adrenals/Urinary Tract: Normal adrenals. Symmetric normal contrast nephrograms. No overt hydronephrosis. A few scattered subcentimeter hypodense upper left renal cortical lesions, too small to characterize, for which no follow-up imaging is  recommended. Normal bladder. Stomach/Bowel: Normal non-distended  stomach. Normal caliber small bowel loops with no small bowel wall thickening. There is a short segment small bowel intussusception in the left abdomen on today's scan (series 8/image 65), which is favored to represent a different site from the short segment small bowel intussusception identified in the right abdomen on the scan from 1 day prior (which appeared to be located in the proximal jejunum, compared to the suspected mid jejunal location on today's scan). No significant upstream small bowel dilatation. No discrete small bowel mass. Appendectomy. Scattered mild colonic diverticulosis with no large bowel wall thickening or significant pericolonic fat stranding. Vascular/Lymphatic: Normal caliber abdominal aorta. Patent portal, splenic, hepatic and renal veins. No pathologically enlarged lymph nodes in the abdomen or pelvis. Reproductive: No right adnexal mass. Left adnexal 7.9 x 7.8 cm cyst (series 2/image 65), stable. Grossly normal anteverted uterus. Other: No pneumoperitoneum, ascites or focal fluid collection. Musculoskeletal: No aggressive appearing focal osseous lesions. Moderate thoracolumbar spondylosis. IMPRESSION: 1. Short segment small bowel intussusception in the left abdomen on today's scan, which is favored to represent a different site from the short segment small bowel intussusception identified in the right abdomen on the scan from 1 day prior. No discrete small bowel mass. No evidence of small-bowel obstruction. As previously noted, short segment small bowel intussusceptions are generally transient and of no clinical significance. Any need for follow-up imaging should be based on clinical assessment. 2. Scattered mild colonic diverticulosis. 3. Left adnexal 7.9 cm cyst, stable. As previously recommended, further evaluation with pelvic ultrasound is advised on a short term outpatient basis. Electronically Signed   By: Selinda DELENA Blue M.D.   On: 02/02/2024 11:34   CT ABDOMEN PELVIS WO CONTRAST Result Date: 02/01/2024 CLINICAL DATA:  Two day history of right upper quadrant abdominal pain EXAM: CT ABDOMEN AND PELVIS WITHOUT CONTRAST TECHNIQUE: Multidetector CT imaging of the abdomen and pelvis was performed following the standard protocol without IV contrast. RADIATION DOSE REDUCTION: This exam was performed according to the departmental dose-optimization program which includes automated exposure control, adjustment of the mA and/or kV according to patient size and/or use of iterative reconstruction technique. COMPARISON:  CT abdomen and pelvis dated 05/02/2023 and multiple priors FINDINGS: Lower chest: No focal consolidation or pulmonary nodule in the lung bases. No pleural effusion or pneumothorax demonstrated. Partially imaged heart size is normal. Hepatobiliary: Subcentimeter segment 3 hypodensity (2:25), too small to characterize, but unchanged dating back to 12/08/2021, likely benign. No intra or extrahepatic biliary ductal dilation. Cholecystectomy. Pancreas: No focal lesions or main ductal dilation. Spleen: Normal in size without focal abnormality. Adrenals/Urinary Tract: No adrenal nodules. No suspicious renal mass on this noncontrast enhanced examination , calculi or hydronephrosis. No focal bladder wall thickening. Stomach/Bowel: Normal appearance of the stomach. When compared to multiple prior CT examinations, the proximal jejunum beyond the level of the duodenojejunal junction is displaced into the right hemiabdomen on today's examination (8:52) with short-segment small bowel telescoping (8:51). No evidence of bowel wall thickening, distention, or inflammatory changes. Appendectomy. Vascular/Lymphatic: No significant vascular findings are present. No enlarged abdominal or pelvic lymph nodes. Reproductive: Again seen is simple fluid attenuation left adnexal cyst measuring 7.9 x 7.6 cm, slightly decreased in size from 8.3 x 8.2 cm  on 05/02/2023. Along the superomedial aspect of this cyst, there is a 10 mm nodular focus (2:60). No right adnexal mass. Other: No free fluid, fluid collection, or free air. Musculoskeletal: No acute or abnormal lytic or blastic osseous lesions. Multilevel degenerative changes of the partially  imaged thoracic and lumbar spine. IMPRESSION: 1. When compared to multiple prior CT examinations, the proximal jejunum is displaced into the right hemiabdomen on today's examination with short-segment small bowel intussusception. No current evidence of bowel obstruction. Findings may reflect mesenteric laxity. Small-bowel intussusception is most often physiologic and transient. Consider follow-up nonemergent CT enterography to assess resolution and evaluate for possible lead point. 2. No CT findings of bile duct dilation. 3. Slightly decreased size of simple fluid attenuation left adnexal cyst measuring 7.9 x 7.6 cm, previously 8.3 x 8.2 cm on 05/02/2023. Along the superomedial aspect of this cyst, there is a 10 mm nodular focus, indeterminate. Recommend further evaluation with nonemergent pelvic ultrasound examination. Electronically Signed   By: Limin  Xu M.D.   On: 02/01/2024 12:16    Anti-infectives: Anti-infectives (From admission, onward)    Start     Dose/Rate Route Frequency Ordered Stop   02/03/24 0915  piperacillin -tazobactam (ZOSYN ) IVPB 3.375 g        3.375 g 12.5 mL/hr over 240 Minutes Intravenous Every 8 hours 02/03/24 0826         Assessment/Plan: s/p * No surgery found * Continue clears for now Abdominal pain improved althought wbc is elevated. Will start empirically on zosyn  CT yesterday shows a short segment intussusception with no sign of obstruction but seems to be different site and day before. Will monitor for now Right sided numbness. CT neg but might not show acute embolic process. Per medicine  LOS: 0 days    Deward Null III 02/03/2024

## 2024-02-04 ENCOUNTER — Encounter (HOSPITAL_COMMUNITY): Payer: Self-pay | Admitting: Internal Medicine

## 2024-02-04 ENCOUNTER — Inpatient Hospital Stay (HOSPITAL_COMMUNITY): Payer: BC Managed Care – PPO

## 2024-02-04 ENCOUNTER — Inpatient Hospital Stay (HOSPITAL_COMMUNITY): Payer: BC Managed Care – PPO | Admitting: Certified Registered Nurse Anesthetist

## 2024-02-04 ENCOUNTER — Encounter (HOSPITAL_COMMUNITY)
Admission: EM | Disposition: A | Discharge status: Home / Self Care | Payer: Self-pay | Source: Home / Self Care | Attending: Internal Medicine

## 2024-02-04 ENCOUNTER — Other Ambulatory Visit: Payer: Self-pay

## 2024-02-04 DIAGNOSIS — F444 Conversion disorder with motor symptom or deficit: Secondary | ICD-10-CM | POA: Insufficient documentation

## 2024-02-04 DIAGNOSIS — K561 Intussusception: Secondary | ICD-10-CM | POA: Diagnosis not present

## 2024-02-04 HISTORY — PX: LAPAROSCOPY: SHX197

## 2024-02-04 LAB — CBC WITH DIFFERENTIAL/PLATELET
Abs Immature Granulocytes: 0.04 10*3/uL (ref 0.00–0.07)
Basophils Absolute: 0.1 10*3/uL (ref 0.0–0.1)
Basophils Relative: 1 %
Eosinophils Absolute: 0.1 10*3/uL (ref 0.0–0.5)
Eosinophils Relative: 1 %
HCT: 42.2 % (ref 36.0–46.0)
Hemoglobin: 13.7 g/dL (ref 12.0–15.0)
Immature Granulocytes: 1 %
Lymphocytes Relative: 40 %
Lymphs Abs: 3.5 10*3/uL (ref 0.7–4.0)
MCH: 30.3 pg (ref 26.0–34.0)
MCHC: 32.5 g/dL (ref 30.0–36.0)
MCV: 93.4 fL (ref 80.0–100.0)
Monocytes Absolute: 0.5 10*3/uL (ref 0.1–1.0)
Monocytes Relative: 6 %
Neutro Abs: 4.5 10*3/uL (ref 1.7–7.7)
Neutrophils Relative %: 51 %
Platelets: 248 10*3/uL (ref 150–400)
RBC: 4.52 MIL/uL (ref 3.87–5.11)
RDW: 13 % (ref 11.5–15.5)
WBC: 8.7 10*3/uL (ref 4.0–10.5)
nRBC: 0 % (ref 0.0–0.2)

## 2024-02-04 LAB — MAGNESIUM: Magnesium: 2.5 mg/dL — ABNORMAL HIGH (ref 1.7–2.4)

## 2024-02-04 LAB — RENAL FUNCTION PANEL
Albumin: 3.4 g/dL — ABNORMAL LOW (ref 3.5–5.0)
Anion gap: 7 (ref 5–15)
BUN: 9 mg/dL (ref 6–20)
CO2: 26 mmol/L (ref 22–32)
Calcium: 8.4 mg/dL — ABNORMAL LOW (ref 8.9–10.3)
Chloride: 105 mmol/L (ref 98–111)
Creatinine, Ser: 0.92 mg/dL (ref 0.44–1.00)
GFR, Estimated: >60 mL/min (ref >60)
Glucose, Bld: 108 mg/dL — ABNORMAL HIGH (ref 70–99)
Phosphorus: 3.9 mg/dL (ref 2.5–4.6)
Potassium: 3.6 mmol/L (ref 3.5–5.1)
Sodium: 138 mmol/L (ref 135–145)

## 2024-02-04 LAB — TYPE AND SCREEN
ABO/RH(D): O POS
Antibody Screen: NEGATIVE

## 2024-02-04 LAB — ABO/RH: ABO/RH(D): O POS

## 2024-02-04 LAB — VITAMIN D 25 HYDROXY (VIT D DEFICIENCY, FRACTURES): Vit D, 25-Hydroxy: 19.6 ng/mL — ABNORMAL LOW (ref 30–100)

## 2024-02-04 LAB — VITAMIN B12: Vitamin B-12: 282 pg/mL (ref 180–914)

## 2024-02-04 SURGERY — LAPAROSCOPY, DIAGNOSTIC
Anesthesia: General | Site: Abdomen

## 2024-02-04 MED ORDER — METHOCARBAMOL 500 MG PO TABS: 500.0000 mg | ORAL_TABLET | Freq: Four times a day (QID) | ORAL | Status: DC | PRN

## 2024-02-04 MED ORDER — MEPERIDINE HCL 50 MG/ML IJ SOLN
12.5000 mg | Freq: Once | INTRAMUSCULAR | Status: AC
  Administered 2024-02-04: 12.5 mg via INTRAVENOUS

## 2024-02-04 MED ORDER — BUPIVACAINE-EPINEPHRINE 0.25% -1:200000 IJ SOLN
INTRAMUSCULAR | Status: AC
  Filled 2024-02-04: qty 1

## 2024-02-04 MED ORDER — HYDRALAZINE HCL 20 MG/ML IJ SOLN: 10.0000 mg | INTRAMUSCULAR | Status: DC | PRN

## 2024-02-04 MED ORDER — KETOROLAC TROMETHAMINE 15 MG/ML IJ SOLN
15.0000 mg | Freq: Four times a day (QID) | INTRAMUSCULAR | Status: DC | PRN
  Administered 2024-02-04 – 2024-02-05 (×3): 15 mg via INTRAVENOUS
  Filled 2024-02-04 (×3): qty 1

## 2024-02-04 MED ORDER — ACETAMINOPHEN 10 MG/ML IV SOLN: 1000.0000 mg | Freq: Once | INTRAVENOUS | Status: DC

## 2024-02-04 MED ORDER — HYDROMORPHONE HCL 1 MG/ML IJ SOLN: 1.0000 mg | Freq: Four times a day (QID) | INTRAMUSCULAR | Status: DC | PRN

## 2024-02-04 MED ORDER — 0.9 % SODIUM CHLORIDE (POUR BTL) OPTIME
TOPICAL | Status: DC | PRN
  Administered 2024-02-04: 1000 mL

## 2024-02-04 MED ORDER — OXYCODONE HCL 5 MG PO TABS
5.0000 mg | ORAL_TABLET | ORAL | Status: DC | PRN
  Administered 2024-02-04: 10 mg via ORAL
  Filled 2024-02-04: qty 2

## 2024-02-04 MED ORDER — MIDAZOLAM HCL 2 MG/2ML IJ SOLN
INTRAMUSCULAR | Status: DC | PRN
  Administered 2024-02-04: 2 mg via INTRAVENOUS

## 2024-02-04 MED ORDER — PROPOFOL 10 MG/ML IV BOLUS
INTRAVENOUS | Status: DC | PRN
  Administered 2024-02-04: 200 mg via INTRAVENOUS

## 2024-02-04 MED ORDER — FENTANYL CITRATE (PF) 250 MCG/5ML IJ SOLN
INTRAMUSCULAR | Status: AC
  Filled 2024-02-04: qty 5

## 2024-02-04 MED ORDER — PROPOFOL 10 MG/ML IV BOLUS
INTRAVENOUS | Status: AC
  Filled 2024-02-04: qty 20

## 2024-02-04 MED ORDER — FENTANYL CITRATE PF 50 MCG/ML IJ SOSY
PREFILLED_SYRINGE | INTRAMUSCULAR | Status: DC
Start: 2024-02-04 — End: 2024-02-04
  Filled 2024-02-04: qty 2

## 2024-02-04 MED ORDER — ROCURONIUM BROMIDE 10 MG/ML (PF) SYRINGE
PREFILLED_SYRINGE | INTRAVENOUS | Status: DC | PRN
  Administered 2024-02-04: 50 mg via INTRAVENOUS

## 2024-02-04 MED ORDER — MIDAZOLAM HCL 2 MG/2ML IJ SOLN
INTRAMUSCULAR | Status: AC
Start: 2024-02-04 — End: ?
  Filled 2024-02-04: qty 2

## 2024-02-04 MED ORDER — ONDANSETRON HCL 4 MG/2ML IJ SOLN
INTRAMUSCULAR | Status: DC | PRN
  Administered 2024-02-04: 4 mg via INTRAVENOUS

## 2024-02-04 MED ORDER — METOPROLOL TARTRATE 5 MG/5ML IV SOLN: 5.0000 mg | INTRAVENOUS | Status: DC | PRN

## 2024-02-04 MED ORDER — CHLORHEXIDINE GLUCONATE 0.12 % MT SOLN
15.0000 mL | Freq: Once | OROMUCOSAL | Status: AC
  Administered 2024-02-04: 15 mL via OROMUCOSAL

## 2024-02-04 MED ORDER — ACETAMINOPHEN 10 MG/ML IV SOLN
INTRAVENOUS | Status: DC | PRN
  Administered 2024-02-04: 1000 mg via INTRAVENOUS

## 2024-02-04 MED ORDER — SUGAMMADEX SODIUM 200 MG/2ML IV SOLN
INTRAVENOUS | Status: DC | PRN
  Administered 2024-02-04: 450 mg via INTRAVENOUS

## 2024-02-04 MED ORDER — FENTANYL CITRATE (PF) 250 MCG/5ML IJ SOLN
INTRAMUSCULAR | Status: DC | PRN
  Administered 2024-02-04 (×3): 50 ug via INTRAVENOUS

## 2024-02-04 MED ORDER — FENTANYL CITRATE PF 50 MCG/ML IJ SOSY: 25.0000 ug | PREFILLED_SYRINGE | INTRAMUSCULAR | Status: DC | PRN

## 2024-02-04 MED ORDER — FENTANYL CITRATE PF 50 MCG/ML IJ SOSY
PREFILLED_SYRINGE | INTRAMUSCULAR | Status: AC
  Administered 2024-02-04: 50 ug via INTRAVENOUS
  Filled 2024-02-04: qty 1

## 2024-02-04 MED ORDER — MEPERIDINE HCL 50 MG/ML IJ SOLN
INTRAMUSCULAR | Status: AC
  Filled 2024-02-04: qty 1

## 2024-02-04 MED ORDER — ENOXAPARIN SODIUM 40 MG/0.4ML IJ SOSY: 40.0000 mg | PREFILLED_SYRINGE | INTRAMUSCULAR | Status: DC

## 2024-02-04 MED ORDER — IPRATROPIUM-ALBUTEROL 0.5-2.5 (3) MG/3ML IN SOLN: 3.0000 mL | RESPIRATORY_TRACT | Status: DC | PRN

## 2024-02-04 MED ORDER — GADOBUTROL 1 MMOL/ML IV SOLN
10.0000 mL | Freq: Once | INTRAVENOUS | Status: AC | PRN
  Administered 2024-02-04: 10 mL via INTRAVENOUS

## 2024-02-04 MED ORDER — ACETAMINOPHEN 10 MG/ML IV SOLN
INTRAVENOUS | Status: AC
  Filled 2024-02-04: qty 100

## 2024-02-04 MED ORDER — DEXAMETHASONE SODIUM PHOSPHATE 10 MG/ML IJ SOLN
INTRAMUSCULAR | Status: DC | PRN
  Administered 2024-02-04: 10 mg via INTRAVENOUS

## 2024-02-04 MED ORDER — SUCCINYLCHOLINE CHLORIDE 200 MG/10ML IV SOSY
PREFILLED_SYRINGE | INTRAVENOUS | Status: DC | PRN
  Administered 2024-02-04: 100 mg via INTRAVENOUS

## 2024-02-04 MED ORDER — HYDROMORPHONE HCL 1 MG/ML IJ SOLN: 0.5000 mg | Freq: Four times a day (QID) | INTRAMUSCULAR | Status: DC | PRN

## 2024-02-04 MED ORDER — SENNOSIDES-DOCUSATE SODIUM 8.6-50 MG PO TABS
1.0000 | ORAL_TABLET | Freq: Two times a day (BID) | ORAL | Status: DC
  Administered 2024-02-04 – 2024-02-05 (×2): 1 via ORAL
  Filled 2024-02-04 (×2): qty 1

## 2024-02-04 MED ORDER — ACETAMINOPHEN 500 MG PO TABS
1000.0000 mg | ORAL_TABLET | Freq: Three times a day (TID) | ORAL | Status: DC
  Administered 2024-02-04 – 2024-02-05 (×2): 1000 mg via ORAL
  Filled 2024-02-04 (×2): qty 2

## 2024-02-04 MED ORDER — LIDOCAINE 2% (20 MG/ML) 5 ML SYRINGE
INTRAMUSCULAR | Status: DC | PRN
  Administered 2024-02-04: 60 mg via INTRAVENOUS

## 2024-02-04 MED ORDER — BISACODYL 10 MG RE SUPP: 10.0000 mg | Freq: Once | RECTAL | Status: DC

## 2024-02-04 MED ORDER — BUPIVACAINE-EPINEPHRINE 0.25% -1:200000 IJ SOLN
INTRAMUSCULAR | Status: DC | PRN
  Administered 2024-02-04: 30 mL

## 2024-02-04 MED ORDER — MAGNESIUM CITRATE PO SOLN
0.5000 | Freq: Once | ORAL | Status: AC
  Administered 2024-02-04: 0.5 via ORAL
  Filled 2024-02-04: qty 296

## 2024-02-04 MED ORDER — GUAIFENESIN 100 MG/5ML PO LIQD: 5.0000 mL | ORAL | Status: DC | PRN

## 2024-02-04 MED ORDER — LACTATED RINGERS IV SOLN: INTRAVENOUS | Status: DC

## 2024-02-04 MED ORDER — ACETAMINOPHEN 500 MG PO TABS: 1000.0000 mg | ORAL_TABLET | Freq: Once | ORAL | Status: DC

## 2024-02-04 SURGICAL SUPPLY — 52 items
APPLIER CLIP 5 13 M/L LIGAMAX5 (MISCELLANEOUS)
APPLIER CLIP ROT 10 11.4 M/L (STAPLE)
BAG COUNTER SPONGE SURGICOUNT (BAG) IMPLANT
BLADE EXTENDED COATED 6.5IN (ELECTRODE) IMPLANT
BLADE SURG SZ10 CARB STEEL (BLADE) IMPLANT
CELLS DAT CNTRL 66122 CELL SVR (MISCELLANEOUS) IMPLANT
CHLORAPREP W/TINT 26 (MISCELLANEOUS) ×1 IMPLANT
CLIP APPLIE 5 13 M/L LIGAMAX5 (MISCELLANEOUS) IMPLANT
CLIP APPLIE ROT 10 11.4 M/L (STAPLE) IMPLANT
COVER MAYO STAND STRL (DRAPES) IMPLANT
COVER SURGICAL LIGHT HANDLE (MISCELLANEOUS) ×1 IMPLANT
DERMABOND ADVANCED .7 DNX12 (GAUZE/BANDAGES/DRESSINGS) IMPLANT
DRAPE SHEET LG 3/4 BI-LAMINATE (DRAPES) IMPLANT
DRAPE WARM FLUID 44X44 (DRAPES) IMPLANT
ELECT REM PT RETURN 15FT ADLT (MISCELLANEOUS) ×1 IMPLANT
GAUZE SPONGE 4X4 12PLY STRL (GAUZE/BANDAGES/DRESSINGS) ×1 IMPLANT
GLOVE BIO SURGEON STRL SZ7.5 (GLOVE) ×1 IMPLANT
GLOVE INDICATOR 8.0 STRL GRN (GLOVE) ×1 IMPLANT
GLOVE PI ORTHO PRO STRL 7.5 (GLOVE) ×1 IMPLANT
GOWN STRL REUS W/ TWL XL LVL3 (GOWN DISPOSABLE) ×4 IMPLANT
HANDLE SUCTION POOLE (INSTRUMENTS) IMPLANT
IRRIG SUCT STRYKERFLOW 2 WTIP (MISCELLANEOUS)
IRRIGATION SUCT STRKRFLW 2 WTP (MISCELLANEOUS) IMPLANT
KIT BASIN OR (CUSTOM PROCEDURE TRAY) ×1 IMPLANT
KIT TURNOVER KIT A (KITS) IMPLANT
LEGGING LITHOTOMY PAIR STRL (DRAPES) IMPLANT
RETRACTOR WND ALEXIS 18 MED (MISCELLANEOUS) IMPLANT
RTRCTR WOUND ALEXIS 18CM MED (MISCELLANEOUS)
SCISSORS LAP 5X35 DISP (ENDOMECHANICALS) ×1 IMPLANT
SHEARS HARMONIC 36 ACE (MISCELLANEOUS) IMPLANT
SLEEVE Z-THREAD 5X100MM (TROCAR) ×1 IMPLANT
SPIKE FLUID TRANSFER (MISCELLANEOUS) ×1 IMPLANT
STAPLER SKIN PROX WIDE 3.9 (STAPLE) IMPLANT
STRIP CLOSURE SKIN 1/2X4 (GAUZE/BANDAGES/DRESSINGS) IMPLANT
SUCTION POOLE HANDLE (INSTRUMENTS)
SUT MNCRL AB 4-0 PS2 18 (SUTURE) IMPLANT
SUT PDS AB 1 TP1 96 (SUTURE) IMPLANT
SUT PROLENE 2 0 KS (SUTURE) IMPLANT
SUT PROLENE 2 0 SH DA (SUTURE) IMPLANT
SUT SILK 2 0 SH CR/8 (SUTURE) IMPLANT
SUT SILK 2-0 18XBRD TIE 12 (SUTURE) IMPLANT
SUT SILK 3 0 SH CR/8 (SUTURE) IMPLANT
SUT SILK 3-0 18XBRD TIE 12 (SUTURE) IMPLANT
SYR BULB IRRIG 60ML STRL (SYRINGE) IMPLANT
SYS LAPSCP GELPORT 120MM (MISCELLANEOUS)
SYSTEM LAPSCP GELPORT 120MM (MISCELLANEOUS) IMPLANT
TOWEL OR 17X26 10 PK STRL BLUE (TOWEL DISPOSABLE) ×1 IMPLANT
TRAY FOLEY MTR SLVR 16FR STAT (SET/KITS/TRAYS/PACK) ×1 IMPLANT
TRAY LAPAROSCOPIC (CUSTOM PROCEDURE TRAY) ×1 IMPLANT
TROCAR 11X100 Z THREAD (TROCAR) IMPLANT
TROCAR Z-THREAD OPTICAL 5X100M (TROCAR) ×1 IMPLANT
YANKAUER SUCT BULB TIP NO VENT (SUCTIONS) IMPLANT

## 2024-02-04 NOTE — Op Note (Signed)
   Patient: Tiffany [name] (06-06-1971, [id number])  Date of Surgery: 02/04/2024  Preoperative Diagnosis: Small bowel intussusception   Postoperative Diagnosis: Small bowel intussusception   Surgical Procedure: LAPAROSCOPY DIAGNOSTIC: 40981 (CPT)   Operative Team Members:  Surgeons and Role:    * Cassanda Walmer, Avon Boers, MD - Primary   Anesthesiologist: Ellena Gurney, MD CRNA: Virgil Griffiths, CRNA   Anesthesia: General   Fluids:  Total I/O In: 290 [P.O.:240; IV Piggyback:50] Out: 26 [Urine:1; Blood:25]  Complications: None  Drains:  none   Specimen: None  Disposition:  PACU - hemodynamically stable.  Plan of Care: Admit for overnight observation    Indications for Procedure: Tiffany [name] is a 53 y.o. female who presented with small bowel intussusception.  I was concerned for adhesive disease or other surgical explanation as her symptoms were not improving with conservative management and she had recurrent intussusception found on multiple CT scans.  I recommended diagnostic laparoscopy.  The procedure itself as well as its risks, benefits and alternatives were discussed.  The risks discussed included but were not limited to the risk of infection, bleeding, damage to nearby structures, and bowel injury.  After a full discussion and all questions answered the patient granted consent to proceed.  Findings: Ovarian cyst in the left lower quadrant.  No other abnormality noted within the abdomen   Description of Procedure:   On the date stated above the patient taken the operating suite and placed in supine position.  General endotracheal anesthesia was induced.  A timeout was completed verifying the correct patient, procedure, position, and equipment needed for the case.  The patient's abdomen was prepped and draped in usual sterile fashion.  I made three 5 mm incisions down the left abdomen.  I inserted a 5 mm trocar in the left subcostal region and inflated the abdomen to 15 mmHg  using Optiview approach.  2 additional 5 mm trocars were placed in the left abdomen.  Identified the cecum and the terminal ileum.  I ran the terminal ileum back to the ligament of Treitz.  There were no abnormalities noted in the small bowel.  There was no evidence of intussusception.  I then inspected the colon.  There did seem to be some hard stool throughout the colon.  The appendix staple line appeared normal.  There was no abnormality in the right upper quadrant other than the lack of a gallbladder.  There is no abnormality noted in the left upper quadrant.  There was a ovarian cyst located in the left lower quadrant which appeared benign and a photo was taken.  At this point the abdomen was deflated and the skin was closed using 4-0 Monocryl and Dermabond.  All sponge needle counts were correct at the end of this case.  At the end of the case we reviewed the infection status of the case. Patient: Tiffany [name] Emergency General Surgery Service Patient Case: Urgent Infection Present At Time Of Surgery (PATOS): None  Teddie Favre, MD General, Bariatric, & Minimally Invasive Surgery [facility], [place]

## 2024-02-04 NOTE — Anesthesia Postprocedure Evaluation (Signed)
 Anesthesia Post Note  Patient: Tiffany [name]  Procedure(s) Performed: LAPAROSCOPY DIAGNOSTIC (Abdomen)     Patient location during evaluation: PACU Anesthesia Type: General Level of consciousness: awake and alert Pain management: pain level controlled Vital Signs Assessment: post-procedure vital signs reviewed and stable Respiratory status: spontaneous breathing, nonlabored ventilation, respiratory function stable and patient connected to nasal cannula oxygen Cardiovascular status: blood pressure returned to baseline and stable Postop Assessment: no apparent nausea or vomiting Anesthetic complications: no   No notable events documented.  Last Vitals:  Vitals:   02/04/24 1645 02/04/24 1700  BP: (!) 143/79 (!) 151/86  Pulse: 68 70  Resp: 15 14  Temp:  36.4 C  SpO2: 100% 93%    Last Pain:  Vitals:   02/04/24 1700  TempSrc:   PainSc: 0-No pain                 Santi Troung L Effie Wahlert

## 2024-02-04 NOTE — Anesthesia Procedure Notes (Signed)
 Procedure Name: Intubation Date/Time: 02/04/2024 3:04 PM  Performed by: Virgil Griffiths, CRNAPre-anesthesia Checklist: Patient identified, Emergency Drugs available, Suction available and Patient being monitored Patient Re-evaluated:Patient Re-evaluated prior to induction Oxygen Delivery Method: Circle System Utilized Preoxygenation: Pre-oxygenation with 100% oxygen Induction Type: IV induction, Cricoid Pressure applied and Rapid sequence Ventilation: Mask ventilation without difficulty Laryngoscope Size: Mac and 3 Grade View: Grade I Tube type: Oral Tube size: 7.0 mm Number of attempts: 1 Airway Equipment and Method: Stylet and Oral airway Placement Confirmation: ETT inserted through vocal cords under direct vision, positive ETCO2 and breath sounds checked- equal and bilateral Secured at: 22 cm Tube secured with: Tape Dental Injury: Teeth and Oropharynx as per pre-operative assessment

## 2024-02-04 NOTE — Progress Notes (Addendum)
 Subjective/Chief Complaint:  Reports increased abdominal pain again, similar to when she presented to the ED. Last reported BM was 2/5. She is having flatus. She is not having nausea or vomiting. She is drinking very little clear liquids.   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    CTH negative for acute process, was ordered due to 2 weeks of headaches on the right side. MRI pending to r/o MS or recent stroke.  Objective: Vital signs in last 24 hours: Temp:  [97.6 F (36.4 C)-98 F (36.7 C)] 97.7 F (36.5 C) (02/10 0952) Pulse Rate:  [66-81] 66 (02/10 0952) Resp:  [16-18] 18 (02/10 0952) BP: (114-138)/(66-86) 138/86 (02/10 0956) SpO2:  [96 %-100 %] 98 % (02/10 0952) Last BM Date : 01/30/24 (per pt self-report)  Intake/Output from previous day: 02/09 0701 - 02/10 0700 In: 507.5 [P.O.:420; IV Piggyback:87.5] Out: -  Intake/Output this shift: Total I/O In: 240 [P.O.:240] Out: 1 [Urine:1]  General appearance: alert and cooperative Resp: clear to auscultation bilaterally Cardio: regular rate and rhythm GI: soft,mild distention, mild upper abd tenderness in the epigastric region and RUQ. No guarding or peritonitis.  Lab Results:  Recent Labs    02/03/24 0505 02/04/24 0417  WBC 16.4* 8.7  HGB 14.3 13.7  HCT 43.3 42.2  PLT 266 248   BMET Recent Labs    02/03/24 0505 02/04/24 0417  NA 141 138  K 3.4* 3.6  CL 108 105  CO2 25 26  GLUCOSE 126* 108*  BUN 10 9  CREATININE 0.72 0.92  CALCIUM 8.8* 8.4*   PT/INR No results for input(s): "LABPROT", "INR" in the last 72 hours. ABG No results for input(s): "PHART", "HCO3" in the last 72 hours.  Invalid input(s): "PCO2", "PO2"  Studies/Results: DG Chest 1 View Result Date: 02/03/2024 CLINICAL DATA:  Chest and abdominal pain EXAM: PORTABLE CHEST 1 VIEW COMPARISON:   11/15/2023 FINDINGS: Cardiac shadow is within normal limits. The lungs are well aerated bilaterally. No focal infiltrate or effusion is seen. IMPRESSION: No active disease. Electronically Signed   By: Violeta Grey M.D.   On: 02/03/2024 10:56   CT HEAD WO CONTRAST ( ) Result Date: 02/03/2024 CLINICAL DATA:  Initial evaluation for acute TIA. EXAM: CT HEAD WITHOUT CONTRAST TECHNIQUE: Contiguous axial images were obtained from the base of the skull through the vertex without intravenous contrast. RADIATION DOSE REDUCTION: This exam was performed according to the departmental dose-optimization program which includes automated exposure control, adjustment of the mA and/or kV according to patient size and/or use of iterative reconstruction technique. COMPARISON:  None Available. FINDINGS: Brain: Cerebral volume within normal limits for patient age. No acute intracranial hemorrhage. No acute large vessel territory infarct. No mass lesion, midline shift, or mass effect. Ventricles are normal in size without hydrocephalus. No extra-axial fluid collection. Mild 5 mm cerebellar tonsillar ectopia without overt Chiari malformation. Vascular: No abnormal hyperdense vessel. Skull: Scalp soft tissues demonstrate no acute abnormality. Calvarium intact. Sinuses/Orbits: Globes and orbital soft tissues within normal limits. Visualized paranasal sinuses are largely clear. No significant mastoid effusion. IMPRESSION: 1. No acute intracranial abnormality. 2. Mild 5 mm cerebellar tonsillar ectopia without frank Chiari malformation. Electronically Signed   By: Virgia Griffins M.D.   On: 02/03/2024 00:49    Anti-infectives: Anti-infectives (From admission, onward)    Start     Dose/Rate Route Frequency Ordered Stop  02/03/24 0900  piperacillin -tazobactam (ZOSYN ) IVPB 3.375 g        3.375 g 12.5 mL/hr over 240 Minutes Intravenous Every 8 hours 02/03/24 1610         Assessment/Plan: Intussusception  - afebrile, VSS -  clinically she has ongoing pain and minimal bowel function. Recommend diagnostic laparoscopy, possible bowel resection today to further evaluate.  - NPO, sips with meds ok  - no need for abx from CCS standpoint. Would recommend D/C Zosyn .   The operative and non-operative management of intussusception was discussed with the patient. Risks of surgery including bleeding, infection, damage to surrounding structures, conversion to open, drain placement, need for additional procedures, prolonged hospital stay, as well as the risks of general anesthesia were discussed with the patient and she would like to proceed with surgery. Questions were welcomed and answered.    HTN SVT - on dilt at home GAD Complex regional pain syndrome - cymbalta  at home    LOS: 1 day    Charlott Converse 02/04/2024

## 2024-02-04 NOTE — Transfer of Care (Signed)
 Immediate Anesthesia Transfer of Care Note  Patient: Tiffany [name]  Procedure(s) Performed: LAPAROSCOPY DIAGNOSTIC (Abdomen)  Patient Location: PACU  Anesthesia Type:General  Level of Consciousness: awake and alert   Airway & Oxygen Therapy: Patient Spontanous Breathing  Post-op Assessment: Report given to RN and Post -op Vital signs reviewed and stable  Post vital signs: Reviewed and stable  Last Vitals:  Vitals Value Taken Time  BP 144/115 02/04/24 1600  Temp    Pulse 70 02/04/24 1601  Resp    SpO2 96 % 02/04/24 1601  Vitals shown include unfiled device data.  Last Pain:  Vitals:   02/04/24 1300  TempSrc:   PainSc: 5       Patients Stated Pain Goal: 4 (02/04/24 1300)  Complications: No notable events documented.

## 2024-02-04 NOTE — Progress Notes (Signed)
 Received patient post surgery at 56

## 2024-02-04 NOTE — Plan of Care (Signed)

## 2024-02-04 NOTE — Assessment & Plan Note (Signed)
 Patient complaining of 1 week history of intermittent right-sided weakness numbness and right-sided headaches Upon patient's presentation to an urgent care clinic on 2/4 and presentation to our emergency department 2/6 the symptoms were not mentioned Of note, patient was hospitalized at [facility] 09/2022 with similar symptoms.  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 Cleone Dad as patient's MRI and EEG were unremarkable. Patient was to follow-up as an outpatient at that time Upon a lengthy discussion with the patient about her symptoms we agreed to obtain an MRI of the brain with and without contrast to identify any evidence of recent stroke or MS lesions and if this was negative patient is to follow-up with neurology.

## 2024-02-04 NOTE — Anesthesia Preprocedure Evaluation (Addendum)
 Anesthesia Evaluation  Patient identified by MRN, date of birth, ID band Patient awake    Reviewed: Allergy & Precautions, NPO status , Patient's Chart, lab work & pertinent test results  History of Anesthesia Complications (+) PONV and history of anesthetic complications  Airway Mallampati: II  TM Distance: >3 FB Neck ROM: Full    Dental no notable dental hx. (+) Teeth Intact, Dental Advisory Given   Pulmonary Patient abstained from smoking., former smoker   Pulmonary exam normal breath sounds clear to auscultation       Cardiovascular hypertension, Normal cardiovascular exam+ dysrhythmias Supra Ventricular Tachycardia  Rhythm:Regular Rate:Normal  10/2023 echo 1. Left ventricular ejection fraction, by estimation, is 60 to 65%. The left ventricle has normal function. Left ventricular endocardial border not optimally defined to evaluate regional wall motion. Left ventricular diastolic parameters were normal. 2. Right ventricular systolic function is normal. The right ventricular size is normal. Tricuspid regurgitation signal is inadequate for assessing PA pressure. 3. The mitral valve is normal in structure. Trivial mitral valve regurgitation. No evidence of mitral stenosis. 4. The aortic valve is grossly normal. Unable to determine aortic valve morphology due to image quality. Aortic valve regurgitation is trivial. No aortic stenosis is present. 5. The inferior vena cava is normal in size with greater than 50% respiratory variability, suggesting right atrial pressure of 3 mmHg.     Neuro/Psych  PSYCHIATRIC DISORDERS Anxiety Depression    negative neurological ROS     GI/Hepatic negative GI ROS, Neg liver ROS,,,  Endo/Other    Class 3 obesity (BMI 44)  Renal/GU negative Renal ROS  negative genitourinary   Musculoskeletal  (+) Arthritis ,    Abdominal   Peds  Hematology negative hematology ROS (+)   Anesthesia Other  Findings 53 y.o. female with medical history significant for anxiety and depression, SVT, complex regional pain syndrome and arthritis presented to [facility] emergency department with complaints of abdominal pain found to have small bowel intussusception  Reproductive/Obstetrics                             Anesthesia Physical Anesthesia Plan  ASA: 3  Anesthesia Plan: General   Post-op Pain Management: Ofirmev  IV (intra-op)*   Induction: Intravenous, Rapid sequence and Cricoid pressure planned  PONV Risk Score and Plan: 3 and Midazolam , Dexamethasone  and Ondansetron   Airway Management Planned: Oral ETT  Additional Equipment:   Intra-op Plan:   Post-operative Plan: Extubation in OR  Informed Consent: I have reviewed the patients History and Physical, chart, labs and discussed the procedure including the risks, benefits and alternatives for the proposed anesthesia with the patient or authorized representative who has indicated his/her understanding and acceptance.     Dental advisory given  Plan Discussed with: CRNA  Anesthesia Plan Comments:        Anesthesia Quick Evaluation

## 2024-02-04 NOTE — Progress Notes (Signed)
 PROGRESS NOTE    Tiffany [name]  ION:[id number] DOB: 07/16/1971 DOA: 02/01/2024 PCP: Luretha Salmon, FNP    Brief Narrative:   53 y.o. female with medical history significant for anxiety and depression, SVT, complex regional pain syndrome and arthritis presented to [facility] emergency department with complaints of abdominal pain.   Upon evaluation in the emergency department CT imaging of the abdomen revealed evidence of small genial displacement into the right hemiabdomen with short segment small bowel intussusception but no bowel obstruction.  Hospitalist group was then called to assess patient for admission the hospital.   General surgery was consulted and recommended observation in the hospital with repeat CT imaging of the abdomen the following day.  Patient was placed on a clear liquid diet and as needed opiate-based analgesics for pain.   Hospital course has been complicated by bouts of atypical chest pain and complaints of intermittent right-sided neurologic complaints such as numbness and weakness that has been ongoing for over a week.  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  General surgery is following recommending conservative management.  Diet as tolerated and advance slowly.On IV zosyn .    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  Previous provider discussed case with neurology.  CT scan is negative.  Prior MRI and EEG have been unremarkable. Mri Brain again this time is negative -Will check B12, folate  Urinary tract infection - Cultures not sent.  Already on  Zosyn .  Atypical chest pain Received Maalox and resolved her symptoms  Generalized anxiety disorder Continue home regimen of duloxetine  and Xanax   Essential hypertension Continue home regimen of Cozaar  and Cardizem  IV as needed  SVT (supraventricular tachycardia) (HCC) Cardizem .  Currently normal sinus rhythm  Cyst of left ovary Longstanding history of known large cyst of left ovary Continue outpatient follow-up  DVT prophylaxis: enoxaparin  (LOVENOX ) injection 40 mg Start: 02/01/24 2200    Code Status: Full Code Family Communication:   Status is: Inpatient Remains inpatient appropriate because: Ongoing laparoscopy planned today    Subjective:  Plan laparoscopy later today. Does not have any other complaints.  Examination:  General exam: Appears calm and comfortable  Respiratory system: Clear to auscultation. Respiratory effort normal. Cardiovascular system: S1 & S2 heard, RRR. No JVD, murmurs, rubs, gallops or clicks. No pedal edema. Gastrointestinal system: Abdomen is nondistended, soft and nontender. No organomegaly or masses felt. Normal bowel sounds heard. Central nervous system: Alert and oriented.  Slight weak as of her right upper extremity. Extremities: Symmetric 5 x 5 power. Skin: No rashes, lesions or ulcers Psychiatry: Judgement and insight appear normal. Mood & affect appropriate.                Diet Orders (From admission, onward)     Start     Ordered   02/04/24 1025  Diet NPO time specified Except for: Sips with Meds  Diet effective now       Question:  Except for  Answer:  Sips with Meds   02/04/24 1024            Objective: Vitals:   02/04/24 0512 02/04/24  6213 02/04/24 0956 02/04/24 1205  BP: 114/86 138/86 138/86 (!) 135/98  Pulse: 81 66  70  Resp: 16 18  16   Temp: 97.9 F (36.6 C) 97.7 F (36.5 C)  98.4 F (36.9 C)  TempSrc:  Oral    SpO2: 96% 98%  98%  Weight:      Height:        Intake/Output Summary (Last 24  hours) at 02/04/2024 1325 Last data filed at 02/04/2024 0800 Gross per 24 hour  Intake 637.46 ml  Output 1 ml  Net 636.46 ml   Filed Weights   02/01/24 0810  Weight: 112.5 kg    Scheduled Meds:  [MAR Hold] ALPRAZolam   0.5 mg Oral QHS   [MAR Hold] diltiazem   120 mg Oral Daily   [MAR Hold] DULoxetine   60 mg Oral QHS   [MAR Hold] enoxaparin  (LOVENOX ) injection  40 mg Subcutaneous Q24H   [MAR Hold] losartan   25 mg Oral Daily   [MAR Hold] pantoprazole   40 mg Oral Daily   Continuous Infusions:  [MAR Hold] piperacillin -tazobactam (ZOSYN )  IV 3.375 g (02/04/24 1000)    Nutritional status     Body mass index is 43.93 kg/m.  Data Reviewed:   CBC: Recent Labs  Lab 02/01/24 0820 02/03/24 0505 02/04/24 0417  WBC 7.7 16.4* 8.7  NEUTROABS  --  12.9* 4.5  HGB 14.8 14.3 13.7  HCT 44.4 43.3 42.2  MCV 93.9 92.9 93.4  PLT 258 266 248   Basic Metabolic Panel: Recent Labs  Lab 02/01/24 0820 02/03/24 0505 02/04/24 0417  NA 138 141 138  K 4.0 3.4* 3.6  CL 106 108 105  CO2 23 25 26   GLUCOSE 136* 126* 108*  BUN 11 10 9   CREATININE 0.77 0.72 0.92  CALCIUM 8.9 8.8* 8.4*  MG  --  2.4 2.5*  PHOS  --   --  3.9   GFR: Estimated Creatinine Clearance: 86.3 mL/min (by C-G formula based on SCr of 0.92 mg/dL). Liver Function Tests: Recent Labs  Lab 02/01/24 0820 02/03/24 0505 02/04/24 0417  AST 18 19  --   ALT 15 30  --   ALKPHOS 144* 154*  --   BILITOT 0.9 0.5  --   PROT 6.9 6.5  --   ALBUMIN 3.8 3.5 3.4*   Recent Labs  Lab 02/01/24 0820  LIPASE 37   No results for input(s): "AMMONIA" in the last 168 hours. Coagulation Profile: No results for input(s): "INR", "PROTIME" in the last 168 hours. Cardiac Enzymes: No results for input(s): "CKTOTAL", "CKMB", "CKMBINDEX", "TROPONINI" in the last 168 hours. BNP (last 3 results) No results for input(s): "PROBNP" in the last 8760 hours. HbA1C: No results for input(s): "HGBA1C" in the last 72 hours. CBG: No results for  input(s): "GLUCAP" in the last 168 hours. Lipid Profile: Recent Labs    02/03/24 0505  CHOL 169  HDL 43  LDLCALC 108*  TRIG 89  CHOLHDL 3.9   Thyroid Function Tests: No results for input(s): "TSH", "T4TOTAL", "FREET4", "T3FREE", "THYROIDAB" in the last 72 hours. Anemia Panel: Recent Labs    02/04/24 0415  VITAMINB12 282   Sepsis Labs: No results for input(s): "PROCALCITON", "LATICACIDVEN" in the last 168 hours.  No results found for this or any previous visit (from the past 240 hours).       Radiology Studies: MR BRAIN W WO CONTRAST Result Date: 02/04/2024 CLINICAL DATA:  Waxing and waning neurologic deficits EXAM: MRI HEAD WITHOUT AND WITH CONTRAST TECHNIQUE: Multiplanar,  multiecho pulse sequences of the brain and surrounding structures were obtained without and with intravenous contrast. CONTRAST:  10mL GADAVIST  GADOBUTROL  1 MMOL/ML IV SOLN COMPARISON:  10/04/2022 MRI head, correlation is made with 02/02/2024 CT head FINDINGS: Brain: No restricted diffusion to suggest acute or subacute infarct. No abnormal parenchymal or meningeal enhancement. Redemonstrated scattered tiny foci of T2 hyperintense signal in the supratentorial white matter; these are not particularly radially oriented to the ventricles, nor are there lesions involving the corpus callosum. These appear unchanged from 10/04/2022. No acute hemorrhage, mass, mass effect, or midline shift. No hydrocephalus or extra-axial collection. Pituitary within normal limits. Low lying cerebellar tonsils, which extend 4 mm below the foramen magnum and do not meet criteria for Chiari I malformation. No hemosiderin deposition to suggest remote hemorrhage. Normal cerebral volume for age. Vascular: Normal arterial flow voids. Normal arterial and venous enhancement. Skull and upper cervical spine: Normal marrow signal. Sinuses/Orbits: Mild mucosal thickening in the maxillary sinuses. No acute finding in the orbits. Other: The mastoid air  cells are well aerated. IMPRESSION: 1. No acute intracranial process. No evidence of acute or subacute infarct. 2. Redemonstrated scattered tiny foci of T2 hyperintense signal in the supratentorial white matter, which are unchanged from 10/04/2022 and are not characteristic of demyelinating disease. No abnormal enhancement. Electronically Signed   By: Zoila Hines M.D.   On: 02/04/2024 12:38   DG Chest 1 View Result Date: 02/03/2024 CLINICAL DATA:  Chest and abdominal pain EXAM: PORTABLE CHEST 1 VIEW COMPARISON:  11/15/2023 FINDINGS: Cardiac shadow is within normal limits. The lungs are well aerated bilaterally. No focal infiltrate or effusion is seen. IMPRESSION: No active disease. Electronically Signed   By: Violeta Grey M.D.   On: 02/03/2024 10:56   CT HEAD WO CONTRAST ( ) Result Date: 02/03/2024 CLINICAL DATA:  Initial evaluation for acute TIA. EXAM: CT HEAD WITHOUT CONTRAST TECHNIQUE: Contiguous axial images were obtained from the base of the skull through the vertex without intravenous contrast. RADIATION DOSE REDUCTION: This exam was performed according to the departmental dose-optimization program which includes automated exposure control, adjustment of the mA and/or kV according to patient size and/or use of iterative reconstruction technique. COMPARISON:  None Available. FINDINGS: Brain: Cerebral volume within normal limits for patient age. No acute intracranial hemorrhage. No acute large vessel territory infarct. No mass lesion, midline shift, or mass effect. Ventricles are normal in size without hydrocephalus. No extra-axial fluid collection. Mild 5 mm cerebellar tonsillar ectopia without overt Chiari malformation. Vascular: No abnormal hyperdense vessel. Skull: Scalp soft tissues demonstrate no acute abnormality. Calvarium intact. Sinuses/Orbits: Globes and orbital soft tissues within normal limits. Visualized paranasal sinuses are largely clear. No significant mastoid effusion. IMPRESSION: 1. No  acute intracranial abnormality. 2. Mild 5 mm cerebellar tonsillar ectopia without frank Chiari malformation. Electronically Signed   By: Virgia Griffins M.D.   On: 02/03/2024 00:49           LOS: 1 day   Time spent= 35 mins    Maggie Schooner, MD [facility]  If 7PM-7AM, please contact night-coverage  02/04/2024, 1:25 PM

## 2024-02-05 ENCOUNTER — Encounter (HOSPITAL_COMMUNITY): Payer: Self-pay | Admitting: Surgery

## 2024-02-05 DIAGNOSIS — K561 Intussusception: Secondary | ICD-10-CM | POA: Diagnosis not present

## 2024-02-05 LAB — BASIC METABOLIC PANEL
Anion gap: 7 (ref 5–15)
BUN: 9 mg/dL (ref 6–20)
CO2: 27 mmol/L (ref 22–32)
Calcium: 8.8 mg/dL — ABNORMAL LOW (ref 8.9–10.3)
Chloride: 101 mmol/L (ref 98–111)
Creatinine, Ser: 0.85 mg/dL (ref 0.44–1.00)
GFR, Estimated: >60 mL/min (ref >60)
Glucose, Bld: 183 mg/dL — ABNORMAL HIGH (ref 70–99)
Potassium: 4.1 mmol/L (ref 3.5–5.1)
Sodium: 135 mmol/L (ref 135–145)

## 2024-02-05 LAB — CBC
HCT: 44.6 % (ref 36.0–46.0)
Hemoglobin: 14.7 g/dL (ref 12.0–15.0)
MCH: 30.6 pg (ref 26.0–34.0)
MCHC: 33 g/dL (ref 30.0–36.0)
MCV: 92.7 fL (ref 80.0–100.0)
Platelets: 265 10*3/uL (ref 150–400)
RBC: 4.81 MIL/uL (ref 3.87–5.11)
RDW: 12.5 % (ref 11.5–15.5)
WBC: 11.7 10*3/uL — ABNORMAL HIGH (ref 4.0–10.5)
nRBC: 0 % (ref 0.0–0.2)

## 2024-02-05 LAB — MAGNESIUM: Magnesium: 2.9 mg/dL — ABNORMAL HIGH (ref 1.7–2.4)

## 2024-02-05 LAB — FOLATE: Folate: 10.2 ng/mL (ref >5.9)

## 2024-02-05 MED ORDER — CEPHALEXIN 500 MG PO CAPS: 500.0000 mg | ORAL_CAPSULE | Freq: Three times a day (TID) | ORAL | 0 refills | Status: AC

## 2024-02-05 MED ORDER — VITAMIN D (ERGOCALCIFEROL) 1.25 MG (50000 UNIT) PO CAPS: 50000.0000 [IU] | ORAL_CAPSULE | ORAL | 0 refills | Status: AC

## 2024-02-05 MED ORDER — OXYCODONE HCL 5 MG PO TABS: 5.0000 mg | ORAL_TABLET | Freq: Four times a day (QID) | ORAL | 0 refills | Status: DC | PRN

## 2024-02-05 MED ORDER — SENNOSIDES-DOCUSATE SODIUM 8.6-50 MG PO TABS: 1.0000 | ORAL_TABLET | Freq: Two times a day (BID) | ORAL | Status: DC

## 2024-02-05 MED ORDER — CEPHALEXIN 500 MG PO CAPS
500.0000 mg | ORAL_CAPSULE | Freq: Three times a day (TID) | ORAL | Status: DC
  Administered 2024-02-05: 500 mg via ORAL
  Filled 2024-02-05: qty 1

## 2024-02-05 MED ORDER — ACETAMINOPHEN 500 MG PO TABS: 1000.0000 mg | ORAL_TABLET | Freq: Three times a day (TID) | ORAL | Status: AC | PRN

## 2024-02-05 MED ORDER — VITAMIN D (ERGOCALCIFEROL) 1.25 MG (50000 UNIT) PO CAPS
50000.0000 [IU] | ORAL_CAPSULE | ORAL | Status: DC
Start: 2024-02-05 — End: 2024-02-05
  Filled 2024-02-05: qty 1

## 2024-02-05 NOTE — Discharge Summary (Signed)
Physician Discharge Summary  NOVALI VOLLMAN UJW:[id number] DOB: 10-Apr-1971 DOA: 02/01/2024  PCP: Alda Ponder, FNP  Admit date: 02/01/2024 Discharge date: 02/05/2024  Admitted From: Home Disposition: Home  Recommendations for Outpatient Follow-up:  Follow up with PCP in 1-2 weeks Please obtain BMP/CBC in one week your next doctors visit.  3 more days of p.o. Keflex Pain medication with bowel regimen prescribed Vitamin D 50,000 units weekly.  Repeat outpatient levels in about 4 to 6 weeks Should follow-up with outpatient neurology   Discharge Condition: Stable CODE STATUS: Full code Diet recommendation: Regular  Brief/Interim Summary: Brief Narrative:   53 y.o. female with medical history significant for anxiety and depression, SVT, complex regional pain syndrome and arthritis presented to [facility] emergency department with complaints of abdominal pain.   Upon evaluation in the emergency department CT imaging of the abdomen revealed evidence of small genial displacement into the right hemiabdomen with short segment small bowel intussusception but no bowel obstruction.  Hospitalist group was then called to assess patient for admission the hospital.   General surgery was consulted and recommended observation in the hospital with repeat CT imaging of the abdomen the following day.  Patient was placed on a clear liquid diet and as needed opiate-based analgesics for pain.   Hospital course has been complicated by bouts of atypical chest pain and complaints of intermittent right-sided neurologic complaints such as numbness and weakness that has been ongoing for over a week.  Patient went diagnostic laparoscopy which showed left-sided ovarian cyst otherwise unremarkable.  Antibiotics were discontinued.  For atypical headache, she underwent MRI which was negative.  B12, folate was normal.  Vitamin D deficiency therefore started on supplements  Stable for discharge  today.  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  General surgery ended up performing diagnostic laparoscopy which showed ovarian cyst in the left lower quadrant otherwise no other acute findings.   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  Previous provider discussed case with neurology.  CT scan is negative.  Prior MRI and EEG have been unremarkable. Mri Brain again this time is negative -B12, folate= normal.   Urinary tract infection - Cultures not sent.  Already on Zosyn.  Vitamin D deficiency - Supplements started  Atypical chest pain Received Maalox and resolved her symptoms  Generalized anxiety disorder Continue home regimen of duloxetine and Xanax  Essential hypertension Continue home regimen of Cozaar and Cardizem IV as needed  SVT (supraventricular tachycardia) (HCC) Cardizem.  Currently normal sinus rhythm  Cyst of left ovary Longstanding history of known large cyst of left ovary Continue outpatient follow-up  DVT prophylaxis: enoxaparin (LOVENOX) injection 40 mg Start: 02/01/24 2200    Code Status: Full Code Family Communication:   Status is: Inpatient Remains inpatient appropriate because: Discharge to  Subjective: Seen at bedside no complaints.  Wishing to go home today.  Examination:  General exam: Appears calm and comfortable  Respiratory system: Clear to auscultation. Respiratory effort normal. Cardiovascular system: S1 & S2 heard, RRR. No JVD, murmurs, rubs, gallops or clicks. No pedal edema. Gastrointestinal system: Abdomen is nondistended, soft  and nontender.  No organomegaly or masses felt. Normal bowel sounds heard. Central nervous system: Alert and oriented.  Slight weak as of her right upper extremity. Extremities: Symmetric 5 x 5 power. Skin: No rashes, lesions or ulcers Psychiatry: Judgement and insight appear normal. Mood & affect appropriate.    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      Discharge Exam: Vitals:   02/05/24 1022 02/05/24 1036  BP: 131/86 131/86  Pulse:  93  Resp:  18  Temp:  97.9 F (36.6 C)  SpO2:  97%   Vitals:   02/05/24 0208 02/05/24 0630 02/05/24 1022 02/05/24 1036  BP: (!) 141/85 122/75 131/86 131/86  Pulse: 92 72  93  Resp: 15 18  18   Temp: 98.2 F (36.8 C) 98.2 F (36.8 C)  97.9 F (36.6 C)  TempSrc: Oral Oral    SpO2: 93% 92%  97%  Weight:      Height:          Discharge Instructions  Discharge Instructions     Ambulatory referral to Neurology   Complete by: As directed    An appointment is requested in approximately: 2 weeks DX: CRPS      Allergies as of 02/05/2024       Reactions   Contrast Media [iodinated Contrast Media] Shortness Of Breath   Stadol [butorphanol] Shortness Of Breath   Beta Adrenergic Blockers Other (See Comments)   Wheezing        Medication List     TAKE these medications    acetaminophen 500 MG tablet Commonly known as: TYLENOL Take 2 tablets (1,000 mg total) by mouth every 8 (eight) hours as needed.   ALPRAZolam 0.5 MG tablet Commonly known as: XANAX Take 1 tablet (0.5 mg total) by mouth daily as needed for anxiety. What changed:  when to take this additional instructions   cephALEXin 500 MG capsule Commonly known as: KEFLEX Take 1 capsule (500 mg  total) by mouth every 8 (eight) hours for 3 days.   diltiazem 120 MG 24 hr capsule Commonly known as: CARDIZEM CD Take 120 mg by mouth daily.   DULoxetine 60 MG capsule Commonly known as: CYMBALTA Take 1 capsule (60 mg total) by mouth daily. What changed: when to take this   losartan 25 MG tablet Commonly known as: COZAAR Take 25 mg by mouth daily.   oxyCODONE 5 MG immediate release tablet Commonly known as: Oxy IR/ROXICODONE Take 1 tablet (5 mg total) by mouth every 6 (six) hours as needed for severe pain (pain score 7-10) (not releived by tylenol).   senna-docusate 8.6-50 MG tablet Commonly known as: Senokot-S Take 1 tablet by mouth 2 (two) times daily.   Vitamin D (Ergocalciferol) 1.25 MG (50000 UNIT) Caps capsule Commonly known as: DRISDOL Take 1 capsule (50,000 Units total) by mouth every 7 (seven) days.        Follow-up Information     Maczis, Hedda Slade, PA-C Follow up.   Specialty: General Surgery Why: our office is scheduling you for post-operative follow up after abdominal surgery. call to confirm appointment date/time. Contact information: [address] [place] [address] [phone]                Allergies  Allergen Reactions   Contrast Media [Iodinated Contrast Media] Shortness Of Breath   Stadol [Butorphanol] Shortness Of Breath   Beta Adrenergic Blockers Other (See Comments)    Wheezing     You were cared for by a hospitalist during your hospital stay. If you have any questions about your discharge medications or the care you received while you were in the hospital after you are discharged, you can call the unit and asked to speak with the hospitalist on call if the hospitalist that took care of you is not available. Once you are discharged, your primary care physician will handle any further medical issues. Please note that no refills for any discharge medications will be authorized once you are discharged, as it is imperative  that you return to your primary care physician (or establish a relationship with a primary care physician if you do not have one) for your aftercare needs so that they can reassess your need for medications and monitor your lab values.  You were cared for by a hospitalist during your hospital stay. If you have any questions about your discharge medications or the care you received while you were in the hospital after you are discharged, you can call the unit and asked to speak with the hospitalist on call if the hospitalist that took care of you is not available. Once you are discharged, your primary care physician will handle any further medical issues. Please note that NO REFILLS for any discharge medications will be authorized once you are discharged, as it is imperative that you return to your primary care physician (or establish a relationship with a primary care physician if you do not have one) for your aftercare needs so that they can reassess your need for medications and monitor your lab values.  Please request your Prim.MD to go over all Hospital Tests and Procedure/Radiological results at the follow up, please get all Hospital records sent to your Prim MD by signing hospital release before you go home.  Get CBC, CMP, 2 view Chest X ray checked  by Primary MD during your next visit or SNF MD in 5-7 days ( we routinely change or add medications that can affect your baseline labs and fluid status, therefore we recommend that you get the mentioned basic workup next visit with your PCP, your PCP may decide not to get them or add new tests based on their clinical decision)  On your next visit with your primary care physician please Get Medicines reviewed and adjusted.  If you experience worsening of your admission symptoms, develop shortness of breath, life threatening emergency, suicidal or homicidal thoughts you must seek medical attention immediately by calling 911 or calling your MD immediately   if symptoms less severe.  You Must read complete instructions/literature along with all the possible adverse reactions/side effects for all the Medicines you take and that have been prescribed to you. Take any new Medicines after you have completely understood and accpet all the possible adverse reactions/side effects.   Do not drive, operate heavy machinery, perform activities at heights, swimming or participation in water activities or provide baby sitting services if your were admitted for syncope or siezures until you have seen by Primary MD or a Neurologist and advised to do so again.  Do not drive when taking Pain medications.   Procedures/Studies: MR BRAIN W WO CONTRAST Result Date: 02/04/2024 CLINICAL DATA:  Waxing and waning neurologic deficits EXAM: MRI HEAD WITHOUT AND WITH CONTRAST TECHNIQUE: Multiplanar, multiecho pulse  sequences of the brain and surrounding structures were obtained without and with intravenous contrast. CONTRAST:  10mL GADAVIST GADOBUTROL 1 MMOL/ML IV SOLN COMPARISON:  10/04/2022 MRI head, correlation is made with 02/02/2024 CT head FINDINGS: Brain: No restricted diffusion to suggest acute or subacute infarct. No abnormal parenchymal or meningeal enhancement. Redemonstrated scattered tiny foci of T2 hyperintense signal in the supratentorial white matter; these are not particularly radially oriented to the ventricles, nor are there lesions involving the corpus callosum. These appear unchanged from 10/04/2022. No acute hemorrhage, mass, mass effect, or midline shift. No hydrocephalus or extra-axial collection. Pituitary within normal limits. Low lying cerebellar tonsils, which extend 4 mm below the foramen magnum and do not meet criteria for Chiari I malformation. No hemosiderin deposition to suggest remote hemorrhage. Normal cerebral volume for age. Vascular: Normal arterial flow voids. Normal arterial and venous enhancement. Skull and upper cervical spine: Normal marrow  signal. Sinuses/Orbits: Mild mucosal thickening in the maxillary sinuses. No acute finding in the orbits. Other: The mastoid air cells are well aerated. IMPRESSION: 1. No acute intracranial process. No evidence of acute or subacute infarct. 2. Redemonstrated scattered tiny foci of T2 hyperintense signal in the supratentorial white matter, which are unchanged from 10/04/2022 and are not characteristic of demyelinating disease. No abnormal enhancement. Electronically Signed   By: Wiliam Ke M.D.   On: 02/04/2024 12:38   DG Chest 1 View Result Date: 02/03/2024 CLINICAL DATA:  Chest and abdominal pain EXAM: PORTABLE CHEST 1 VIEW COMPARISON:  11/15/2023 FINDINGS: Cardiac shadow is within normal limits. The lungs are well aerated bilaterally. No focal infiltrate or effusion is seen. IMPRESSION: No active disease. Electronically Signed   By: Alcide Clever M.D.   On: 02/03/2024 10:56   CT HEAD WO CONTRAST ( ) Result Date: 02/03/2024 CLINICAL DATA:  Initial evaluation for acute TIA. EXAM: CT HEAD WITHOUT CONTRAST TECHNIQUE: Contiguous axial images were obtained from the base of the skull through the vertex without intravenous contrast. RADIATION DOSE REDUCTION: This exam was performed according to the departmental dose-optimization program which includes automated exposure control, adjustment of the mA and/or kV according to patient size and/or use of iterative reconstruction technique. COMPARISON:  None Available. FINDINGS: Brain: Cerebral volume within normal limits for patient age. No acute intracranial hemorrhage. No acute large vessel territory infarct. No mass lesion, midline shift, or mass effect. Ventricles are normal in size without hydrocephalus. No extra-axial fluid collection. Mild 5 mm cerebellar tonsillar ectopia without overt Chiari malformation. Vascular: No abnormal hyperdense vessel. Skull: Scalp soft tissues demonstrate no acute abnormality. Calvarium intact. Sinuses/Orbits: Globes and orbital soft  tissues within normal limits. Visualized paranasal sinuses are largely clear. No significant mastoid effusion. IMPRESSION: 1. No acute intracranial abnormality. 2. Mild 5 mm cerebellar tonsillar ectopia without frank Chiari malformation. Electronically Signed   By: Rise Mu M.D.   On: 02/03/2024 00:49   CT ABDOMEN PELVIS W CONTRAST Result Date: 02/02/2024 CLINICAL DATA:  Follow-up small bowel intussusception, abdominal pain EXAM: CT ABDOMEN AND PELVIS WITH CONTRAST TECHNIQUE: Multidetector CT imaging of the abdomen and pelvis was performed using the standard protocol following bolus administration of intravenous contrast. RADIATION DOSE REDUCTION: This exam was performed according to the departmental dose-optimization program which includes automated exposure control, adjustment of the mA and/or kV according to patient size and/or use of iterative reconstruction technique. CONTRAST:  OMNIPAQUE IOHEXOL 300 MG/ML  SOLN COMPARISON:  02/01/2024 CT abdomen/pelvis FINDINGS: Lower chest: No significant pulmonary nodules or acute consolidative airspace disease. Hepatobiliary: Normal  liver size. No liver mass. Cholecystectomy. No biliary ductal dilatation. Pancreas: Normal, with no mass or duct dilation. Spleen: Normal size. No mass. Adrenals/Urinary Tract: Normal adrenals. Symmetric normal contrast nephrograms. No overt hydronephrosis. A few scattered subcentimeter hypodense upper left renal cortical lesions, too small to characterize, for which no follow-up imaging is recommended. Normal bladder. Stomach/Bowel: Normal non-distended stomach. Normal caliber small bowel loops with no small bowel wall thickening. There is a short segment small bowel intussusception in the left abdomen on today's scan (series 8/image 65), which is favored to represent a different site from the short segment small bowel intussusception identified in the right abdomen on the scan from 1 day prior (which appeared to be located  in the proximal jejunum, compared to the suspected mid jejunal location on today's scan). No significant upstream small bowel dilatation. No discrete small bowel mass. Appendectomy. Scattered mild colonic diverticulosis with no large bowel wall thickening or significant pericolonic fat stranding. Vascular/Lymphatic: Normal caliber abdominal aorta. Patent portal, splenic, hepatic and renal veins. No pathologically enlarged lymph nodes in the abdomen or pelvis. Reproductive: No right adnexal mass. Left adnexal 7.9 x 7.8 cm cyst (series 2/image 65), stable. Grossly normal anteverted uterus. Other: No pneumoperitoneum, ascites or focal fluid collection. Musculoskeletal: No aggressive appearing focal osseous lesions. Moderate thoracolumbar spondylosis. IMPRESSION: 1. Short segment small bowel intussusception in the left abdomen on today's scan, which is favored to represent a different site from the short segment small bowel intussusception identified in the right abdomen on the scan from 1 day prior. No discrete small bowel mass. No evidence of small-bowel obstruction. As previously noted, short segment small bowel intussusceptions are generally transient and of no clinical significance. Any need for follow-up imaging should be based on clinical assessment. 2. Scattered mild colonic diverticulosis. 3. Left adnexal 7.9 cm cyst, stable. As previously recommended, further evaluation with pelvic ultrasound is advised on a short term outpatient basis. Electronically Signed   By: Delbert Phenix M.D.   On: 02/02/2024 11:34   CT ABDOMEN PELVIS WO CONTRAST Result Date: 02/01/2024 CLINICAL DATA:  Two day history of right upper quadrant abdominal pain EXAM: CT ABDOMEN AND PELVIS WITHOUT CONTRAST TECHNIQUE: Multidetector CT imaging of the abdomen and pelvis was performed following the standard protocol without IV contrast. RADIATION DOSE REDUCTION: This exam was performed according to the departmental dose-optimization program  which includes automated exposure control, adjustment of the mA and/or kV according to patient size and/or use of iterative reconstruction technique. COMPARISON:  CT abdomen and pelvis dated 05/02/2023 and multiple priors FINDINGS: Lower chest: No focal consolidation or pulmonary nodule in the lung bases. No pleural effusion or pneumothorax demonstrated. Partially imaged heart size is normal. Hepatobiliary: Subcentimeter segment 3 hypodensity (2:25), too small to characterize, but unchanged dating back to 12/08/2021, likely benign. No intra or extrahepatic biliary ductal dilation. Cholecystectomy. Pancreas: No focal lesions or main ductal dilation. Spleen: Normal in size without focal abnormality. Adrenals/Urinary Tract: No adrenal nodules. No suspicious renal mass on this noncontrast enhanced examination , calculi or hydronephrosis. No focal bladder wall thickening. Stomach/Bowel: Normal appearance of the stomach. When compared to multiple prior CT examinations, the proximal jejunum beyond the level of the duodenojejunal junction is displaced into the right hemiabdomen on today's examination (8:52) with short-segment small bowel telescoping (8:51). No evidence of bowel wall thickening, distention, or inflammatory changes. Appendectomy. Vascular/Lymphatic: No significant vascular findings are present. No enlarged abdominal or pelvic lymph nodes. Reproductive: Again seen is simple fluid attenuation left adnexal cyst  measuring 7.9 x 7.6 cm, slightly decreased in size from 8.3 x 8.2 cm on 05/02/2023. Along the superomedial aspect of this cyst, there is a 10 mm nodular focus (2:60). No right adnexal mass. Other: No free fluid, fluid collection, or free air. Musculoskeletal: No acute or abnormal lytic or blastic osseous lesions. Multilevel degenerative changes of the partially imaged thoracic and lumbar spine. IMPRESSION: 1. When compared to multiple prior CT examinations, the proximal jejunum is displaced into the right  hemiabdomen on today's examination with short-segment small bowel intussusception. No current evidence of bowel obstruction. Findings may reflect mesenteric laxity. Small-bowel intussusception is most often physiologic and transient. Consider follow-up nonemergent CT enterography to assess resolution and evaluate for possible lead point. 2. No CT findings of bile duct dilation. 3. Slightly decreased size of simple fluid attenuation left adnexal cyst measuring 7.9 x 7.6 cm, previously 8.3 x 8.2 cm on 05/02/2023. Along the superomedial aspect of this cyst, there is a 10 mm nodular focus, indeterminate. Recommend further evaluation with nonemergent pelvic ultrasound examination. Electronically Signed   By: Agustin Cree M.D.   On: 02/01/2024 12:16     The results of significant diagnostics from this hospitalization (including imaging, microbiology, ancillary and laboratory) are listed below for reference.     Microbiology: No results found for this or any previous visit (from the past 240 hours).   Labs: BNP (last 3 results) No results for input(s): "BNP" in the last 8760 hours. Basic Metabolic Panel: Recent Labs  Lab 02/01/24 0820 02/03/24 0505 02/04/24 0417 02/05/24 0438  NA 138 141 138 135  K 4.0 3.4* 3.6 4.1  CL 106 108 105 101  CO2 23 25 26 27   GLUCOSE 136* 126* 108* 183*  BUN 11 10 9 9   CREATININE 0.77 0.72 0.92 0.85  CALCIUM 8.9 8.8* 8.4* 8.8*  MG  --  2.4 2.5* 2.9*  PHOS  --   --  3.9  --    Liver Function Tests: Recent Labs  Lab 02/01/24 0820 02/03/24 0505 02/04/24 0417  AST 18 19  --   ALT 15 30  --   ALKPHOS 144* 154*  --   BILITOT 0.9 0.5  --   PROT 6.9 6.5  --   ALBUMIN 3.8 3.5 3.4*   Recent Labs  Lab 02/01/24 0820  LIPASE 37   No results for input(s): "AMMONIA" in the last 168 hours. CBC: Recent Labs  Lab 02/01/24 0820 02/03/24 0505 02/04/24 0417 02/05/24 0438  WBC 7.7 16.4* 8.7 11.7*  NEUTROABS  --  12.9* 4.5  --   HGB 14.8 14.3 13.7 14.7  HCT 44.4  43.3 42.2 44.6  MCV 93.9 92.9 93.4 92.7  PLT 258 266 248 265   Cardiac Enzymes: No results for input(s): "CKTOTAL", "CKMB", "CKMBINDEX", "TROPONINI" in the last 168 hours. BNP: Invalid input(s): "POCBNP" CBG: No results for input(s): "GLUCAP" in the last 168 hours. D-Dimer No results for input(s): "DDIMER" in the last 72 hours. Hgb A1c No results for input(s): "HGBA1C" in the last 72 hours. Lipid Profile Recent Labs    02/03/24 0505  CHOL 169  HDL 43  LDLCALC 108*  TRIG 89  CHOLHDL 3.9   Thyroid function studies No results for input(s): "TSH", "T4TOTAL", "T3FREE", "THYROIDAB" in the last 72 hours.  Invalid input(s): "FREET3" Anemia work up Recent Labs    02/04/24 0415 02/05/24 0438  VITAMINB12 282  --   FOLATE  --  10.2   Urinalysis    Component Value  Date/Time   COLORURINE YELLOW 02/03/2024 0918   APPEARANCEUR CLOUDY (A) 02/03/2024 0918   LABSPEC 1.025 02/03/2024 0918   PHURINE 5.0 02/03/2024 0918   GLUCOSEU NEGATIVE 02/03/2024 0918   HGBUR SMALL (A) 02/03/2024 0918   BILIRUBINUR NEGATIVE 02/03/2024 0918   BILIRUBINUR neg 11/10/2013 1415   KETONESUR NEGATIVE 02/03/2024 0918   PROTEINUR NEGATIVE 02/03/2024 0918   UROBILINOGEN 0.2 11/10/2013 1415   NITRITE NEGATIVE 02/03/2024 0918   LEUKOCYTESUR LARGE (A) 02/03/2024 0918   Sepsis Labs Recent Labs  Lab 02/01/24 0820 02/03/24 0505 02/04/24 0417 02/05/24 0438  WBC 7.7 16.4* 8.7 11.7*   Microbiology No results found for this or any previous visit (from the past 240 hours).   Time coordinating discharge:  I have spent 35 minutes face to face with the patient and on the ward discussing the patients care, assessment, plan and disposition with other care givers. >50% of the time was devoted counseling the patient about the risks and benefits of treatment/Discharge disposition and coordinating care.   SIGNED:   Miguel Rota, MD  [facility] 02/05/2024, 12:14 PM   If 7PM-7AM, please contact  night-coverage

## 2024-02-05 NOTE — Plan of Care (Signed)
Problem: Education: Goal: Knowledge of General Education information will improve Description: Including pain rating scale, medication(s)/side effects and non-pharmacologic comfort measures Outcome: Progressing   Problem: Health Behavior/Discharge Planning: Goal: Ability to manage health-related needs will improve Outcome: Progressing   Problem: Activity: Goal: Risk for activity intolerance will decrease Outcome: Progressing   Problem: Coping: Goal: Level of anxiety will decrease Outcome: Progressing   Problem: Pain Managment: Goal: General experience of comfort will improve and/or be controlled Outcome: Progressing

## 2024-02-05 NOTE — TOC Transition Note (Signed)
Transition of Care [facility]) - Discharge Note   Patient Details  Name: Tiffany [name] MRN: [id number] Date of Birth: [birth year]  Transition of Care [facility]) CM/SW Contact:  Lanier Clam, RN Phone Number: 02/05/2024, 10:23 AM   Clinical Narrative:  d/c home.     Final next level of care: Home/Self Care Barriers to Discharge: No Barriers Identified   Patient Goals and CMS Choice            Discharge Placement                       Discharge Plan and Services Additional resources added to the After Visit Summary for                                       Social Drivers of Health (SDOH) Interventions SDOH Screenings   Food Insecurity: No Food Insecurity (02/02/2024)  Housing: Low Risk  (02/02/2024)  Transportation Needs: No Transportation Needs (02/02/2024)  Utilities: Not At Risk (02/02/2024)  Depression (PHQ2-9): Low Risk  (06/11/2023)  Financial Resource Strain: Low Risk  (11/12/2023)   Received from [facility]  Physical Activity: Unknown (11/12/2023)   Received from [facility]  Social Connections: Moderately Integrated (02/02/2024)  Stress: No Stress Concern Present (11/12/2023)   Received from [facility]  Tobacco Use: Medium Risk (02/01/2024)     Readmission Risk Interventions     No data to display

## 2024-02-05 NOTE — Progress Notes (Signed)
1 Day Post-Op   Subjective/Chief Complaint:  Abdominal pain improving. Tolerating PO. Having BMs after mag citrate. She does express concern about her electrolytes, vit D levels, and a bump on her right forearm. Reports recent diagnosis of ganglion cyst as well as parathyroid adenoma. Family history of leukemia and renal cell cancer. Also says she lost her daughter due to cancer.   Objective: Vital signs in last 24 hours: Temp:  [97.6 F (36.4 C)-98.4 F (36.9 C)] 98.2 F (36.8 C) (02/11 0630) Pulse Rate:  [59-92] 72 (02/11 0630) Resp:  [12-18] 18 (02/11 0630) BP: (122-207)/(67-115) 122/75 (02/11 0630) SpO2:  [92 %-100 %] 92 % (02/11 0630) Last BM Date : 02/05/24  Intake/Output from previous day: 02/10 0701 - 02/11 0700 In: 752 [P.O.:240; I.V.:450; IV Piggyback:62] Out: 26 [Urine:1; Blood:25] Intake/Output this shift: No intake/output data recorded.  General appearance: alert and cooperative Resp: clear to auscultation bilaterally Cardio: regular rate and rhythm GI: soft,mild distention, minimally tender, no guarding or peritonitis. Incisions are c/d/I with mild surrounding ecchymosis.  MSK: volar aspect of right  foream with a <1cm palpable knot. There is no bruising or cellulitis.   Lab Results:  Recent Labs    02/04/24 0417 02/05/24 0438  WBC 8.7 11.7*  HGB 13.7 14.7  HCT 42.2 44.6  PLT 248 265   BMET Recent Labs    02/04/24 0417 02/05/24 0438  NA 138 135  K 3.6 4.1  CL 105 101  CO2 26 27  GLUCOSE 108* 183*  BUN 9 9  CREATININE 0.92 0.85  CALCIUM 8.4* 8.8*   PT/INR No results for input(s): "LABPROT", "INR" in the last 72 hours. ABG No results for input(s): "PHART", "HCO3" in the last 72 hours.  Invalid input(s): "PCO2", "PO2"  Studies/Results: MR BRAIN W WO CONTRAST Result Date: 02/04/2024 CLINICAL DATA:  Waxing and waning neurologic deficits EXAM: MRI HEAD WITHOUT AND WITH CONTRAST TECHNIQUE: Multiplanar, multiecho pulse sequences of the brain and  surrounding structures were obtained without and with intravenous contrast. CONTRAST:  10mL GADAVIST GADOBUTROL 1 MMOL/ML IV SOLN COMPARISON:  10/04/2022 MRI head, correlation is made with 02/02/2024 CT head FINDINGS: Brain: No restricted diffusion to suggest acute or subacute infarct. No abnormal parenchymal or meningeal enhancement. Redemonstrated scattered tiny foci of T2 hyperintense signal in the supratentorial white matter; these are not particularly radially oriented to the ventricles, nor are there lesions involving the corpus callosum. These appear unchanged from 10/04/2022. No acute hemorrhage, mass, mass effect, or midline shift. No hydrocephalus or extra-axial collection. Pituitary within normal limits. Low lying cerebellar tonsils, which extend 4 mm below the foramen magnum and do not meet criteria for Chiari I malformation. No hemosiderin deposition to suggest remote hemorrhage. Normal cerebral volume for age. Vascular: Normal arterial flow voids. Normal arterial and venous enhancement. Skull and upper cervical spine: Normal marrow signal. Sinuses/Orbits: Mild mucosal thickening in the maxillary sinuses. No acute finding in the orbits. Other: The mastoid air cells are well aerated. IMPRESSION: 1. No acute intracranial process. No evidence of acute or subacute infarct. 2. Redemonstrated scattered tiny foci of T2 hyperintense signal in the supratentorial white matter, which are unchanged from 10/04/2022 and are not characteristic of demyelinating disease. No abnormal enhancement. Electronically Signed   By: Wiliam Ke M.D.   On: 02/04/2024 12:38   DG Chest 1 View Result Date: 02/03/2024 CLINICAL DATA:  Chest and abdominal pain EXAM: PORTABLE CHEST 1 VIEW COMPARISON:  11/15/2023 FINDINGS: Cardiac shadow is within normal limits. The lungs are  well aerated bilaterally. No focal infiltrate or effusion is seen. IMPRESSION: No active disease. Electronically Signed   By: Alcide Clever M.D.   On: 02/03/2024  10:56    Anti-infectives: Anti-infectives (From admission, onward)    Start     Dose/Rate Route Frequency Ordered Stop   02/05/24 1000  cephALEXin (KEFLEX) capsule 500 mg        500 mg Oral Every 8 hours 02/05/24 0911 02/08/24 1359   02/03/24 0900  piperacillin-tazobactam (ZOSYN) IVPB 3.375 g  Status:  Discontinued        3.375 g 12.5 mL/hr over 240 Minutes Intravenous Every 8 hours 02/03/24 0826 02/05/24 0911       Assessment/Plan: Intussusception  - afebrile, VSS - s/p diagnostic laparoscopy yesterday 2/10 by Dr. Dossie Der. Negative for acute abnormality. There was a left ovarian cyst. Some hard stool in transverse colon. - pain controlled, toelrating PO, having bowel function. Stable for discharge from our perspective. I will order pain Rx and arrange outpatient follow up in our office in 3-4 weeks.   Defer need for vit D/Ca supplementation to medical team.  HTN SVT - on dilt at home GAD Complex regional pain syndrome - cymbalta at home    LOS: 2 days    Adam Phenix 02/05/2024

## 2024-02-05 NOTE — Discharge Instructions (Signed)

## 2024-02-08 ENCOUNTER — Ambulatory Visit: Payer: BC Managed Care – PPO | Admitting: Adult Health

## 2024-02-13 NOTE — Progress Notes (Deleted)
 Cardiology Office Note    Date:  02/13/2024  ID:  Tiffany [name], DOB Aug 23, 1971, MRN [id number] PCP:  Alda Ponder, FNP  Cardiologist:  Parke Poisson, MD  Electrophysiologist:  None   Chief Complaint: ***  History of Present Illness: .    Tiffany [name] is a 53 y.o. female with visit-pertinent history of SVT, anxiety, hypertension  First evaluated by Dr. Doreene Adas on 11/14/2023 at the request of her PCP.  Patient was noted to have been diagnosed with SVT in her 38s, maintained on Cardizem 120 mg daily.  In June 2024 she was evaluated for lymphadenopathy and reports of night sweats for 1 month prior.  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  CT chest 06/14/2023 negative for lymphadenopathy and no evidence of intrathoracic malignancy.  It was noted that aortic atherosclerosis was mention.  On 11/12/2023 she saw her PCP with complaints of chest pain.  She noted chest pain radiated to her shoulders and back and stated it felt like SVT pain but had not had SVT.  Isosorbide had not relieved her chest pain.  On 11/15/2023 she presented to [facility] ED with 1 month of exertional left-sided chest pain.  It was noted chest pain was accompanied by shortness of breath, diaphoresis and nausea.  She also noted intermittent palpitations.  On initial evaluation troponin was negative x 2, EKG nonischemic, D-dimer was negative.  She was admitted to medicine service and cardiology was consulted.  Patient reported that she was having chest pain 1-3 times weekly and was relieved when she lay down on her left side.  She noted past pain had progressed in intensity and frequency.  She fell up to episodes of severe left-sided chest pain that radiated to both shoulders, her back, associate with headache, shortness of breath, diaphoresis and nausea.  Echocardiogram indicated LVEF of 60 to 65%, wall motion was unable to be reviewed given endocardial border not optimally defined, diastolic parameters were  normal.  There were no significant valvular abnormalities.  CRP was mildly elevated at 1.2 and sed rate was 11.  Is recommended that she undergo outpatient coronary CTA and start colchicine 0.6 mg total twice daily for 15 days. Coronary CTA on 01/04/2024 indicated a coronary artery calcium score of 0.  On 02/05/2024 she presented to the ED with complaints of right upper abdominal pain.  CT imaging of the abdomen revealed evidence of small genial displacement into the right hemiabdomen with short segment small bowel intussusception but no bowel obstruction.  General surgery was consulted and recommended observation in the hospital with repeat CT imaging of the abdomen the following day.  She was started on clear liquid diet.  Hospital course was complicated by bouts of atypical chest pain and complaints of intermittent right sided neurologic complaints such as numbness and weakness, not felt to be related to CVA.  CT scan was negative and prior MRI and EEG was unremarkable.  Brain MRI was negative.  Patient underwent diagnostic laparoscopy which showed left-sided ovarian cyst and was otherwise unremarkable.  Patient was discharged on 02/05/2024 in stable condition.  Today patient presents for follow-up regarding her coronary CTA.  Today she reports  Chest pain:  Paroxysmal SVT: Hypertension: Vaping:   Labwork independently reviewed:   ROS: .    Please see the history of present illness. Otherwise, review of systems is positive for ***.  All other systems are reviewed and otherwise negative.  Studies Reviewed: Marland Kitchen    EKG:  EKG is ordered today, personally reviewed, demonstrating ***  CV Studies: Cardiac studies reviewed are outlined and summarized above. Otherwise please see EMR for full report.   Current Reported Medications:.    No outpatient medications have been marked as taking for the 02/14/24 encounter (Appointment) with Rip Harbour, NP.    Physical Exam:    VS:  There were no  vitals taken for this visit.   Wt Readings from Last 3 Encounters:  02/01/24 248 lb (112.5 kg)  11/16/23 254 lb 8 oz (115.4 kg)  09/14/23 247 lb (112 kg)    GEN: Well nourished, well developed in no acute distress NECK: No JVD; No carotid bruits CARDIAC: ***RRR, no murmurs, rubs, gallops RESPIRATORY:  Clear to auscultation without rales, wheezing or rhonchi  ABDOMEN: Soft, non-tender, non-distended EXTREMITIES:  No edema; No acute deformity   Asessement and Plan:.     ***     Disposition: F/u with ***  Signed, Rip Harbour, NP

## 2024-02-14 ENCOUNTER — Ambulatory Visit: Payer: BC Managed Care – PPO | Admitting: Cardiology

## 2024-02-14 DIAGNOSIS — I471 Supraventricular tachycardia, unspecified: Secondary | ICD-10-CM

## 2024-02-14 DIAGNOSIS — R072 Precordial pain: Secondary | ICD-10-CM

## 2024-02-14 DIAGNOSIS — I10 Essential (primary) hypertension: Secondary | ICD-10-CM

## 2024-02-17 NOTE — Progress Notes (Unsigned)
 Cardiology Office Note    Date:  02/19/2024  ID:  Tiffany [name], DOB 11-12-1971, MRN [id number] PCP:  Alda Ponder, FNP  Cardiologist:  Parke Poisson, MD  Electrophysiologist:  None   Chief Complaint: Follow up regarding coronary CTA   History of Present Illness: .    Tiffany [name] is a 53 y.o. female with visit-pertinent history of SVT, anxiety, hypertension.   First evaluated by Dr. Jacques Navy on 11/14/2023 at the request of her PCP.  Patient was noted to have been diagnosed with SVT in her 84s, maintained on Cardizem 120 mg daily.  In June 2024 she was evaluated for lymphadenopathy and reports of night sweats for 1 month prior.  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  CT chest 06/14/2023 negative for lymphadenopathy and no evidence of intrathoracic malignancy.  It was noted that aortic atherosclerosis was mention.  On 11/12/2023 she saw her PCP with complaints of chest pain.  She noted chest pain radiated to her shoulders and back and stated it felt like SVT pain but had not had SVT.  Isosorbide had not relieved her chest pain.  On 11/15/2023 she presented to [facility] ED with 1 month of exertional left-sided chest pain.  It was noted chest pain was accompanied by shortness of breath, diaphoresis and nausea.  She also noted intermittent palpitations.  On initial evaluation troponin was negative x 2, EKG nonischemic, D-dimer was negative.  She was admitted to medicine service and cardiology was consulted.  Patient reported that she was having chest pain 1-3 times weekly and was relieved when she lay down on her left side.  She noted past pain had progressed in intensity and frequency.  She fell up to episodes of severe left-sided chest pain that radiated to both shoulders, her back, associate with headache, shortness of breath, diaphoresis and nausea.  Echocardiogram indicated LVEF of 60 to 65%, wall motion was unable to be reviewed given endocardial border not optimally  defined, diastolic parameters were normal.  There were no significant valvular abnormalities.  CRP was mildly elevated at 1.2 and sed rate was 11.  It was recommended that she undergo outpatient coronary CTA and start colchicine 0.6 mg total twice daily for 15 days. Coronary CTA on 01/04/2024 indicated a coronary artery calcium score of 0.  On 02/05/2024 she presented to the ED with complaints of right upper abdominal pain.  CT imaging of the abdomen revealed evidence of small genial displacement into the right hemiabdomen with short segment small bowel intussusception but no bowel obstruction.  General surgery was consulted and recommended observation in the hospital with repeat CT imaging of the abdomen the following day.  She was started on clear liquid diet.  Hospital course was complicated by bouts of atypical chest pain and complaints of intermittent right sided neurologic complaints such as numbness and weakness, not felt to be related to CVA.  CT scan was negative and prior MRI and EEG was unremarkable.  Brain MRI was negative.  Patient underwent diagnostic laparoscopy which showed left-sided ovarian cyst and was otherwise unremarkable.  Patient was discharged on 02/05/2024 in stable condition.  Today patient presents for follow-up regarding her coronary CTA.  Today she reports she is doing ok. Notes she continues to have occasional chest discomfort that she feels is related to her SVT. She notes that she started having chest discomfort with her SVT and PVCs in 2013. She reports a significant number of family members have history of  SVT and PVCs.  She notes that her palpitations recently have been overall infrequent.  She notes that she continues to have dyspnea with exertion, we reviewed her recent echocardiogram and her coronary CTA results, all questions were answered.  Patient notes that her daughter and other family members have history of cancer.  Patient reports that she was previously worked up by  hematology last year.  She reports that recently she has started noticing increased swollen and hard and lymph nodes, she is planning to potentially follow-up with them regarding symptoms and ongoing fatigue.  ROS: .   Today she denies lower extremity edema, melena, hematuria, hemoptysis, diaphoresis, weakness, presyncope, syncope, orthopnea, and PND.  All other systems are reviewed and otherwise negative. Studies Reviewed: Marland Kitchen    EKG:  EKG is not ordered today.  CV Studies:  Cardiac Studies & Procedures   ______________________________________________________________________________________________     ECHOCARDIOGRAM  ECHOCARDIOGRAM COMPLETE 11/16/2023  Narrative ECHOCARDIOGRAM REPORT    Patient Name:   Tiffany [name] Date of Exam: 11/16/2023 Medical Rec #:  [id number]   Height:       63.0 in Accession #:    [id number]  Weight:       254.5 lb Date of Birth:  [birth year]    BSA:          2.142 m Patient Age:    52 years    BP:           126/57 mmHg Patient Gender: F           HR:           58 bpm. Exam Location:  Inpatient  Procedure: 2D Echo, Color Doppler and Cardiac Doppler  Indications:    Chest Pain  History:        Patient has no prior history of Echocardiogram examinations. Arrythmias:h/o SVT; Signs/Symptoms:Chest Pain.  Sonographer:    Milbert Coulter Referring Phys: [id number] VISHAL R PATEL   Sonographer Comments: Technically difficult study due to poor echo windows. IMPRESSIONS   1. Left ventricular ejection fraction, by estimation, is 60 to 65%. The left ventricle has normal function. Left ventricular endocardial border not optimally defined to evaluate regional wall motion. Left ventricular diastolic parameters were normal. 2. Right ventricular systolic function is normal. The right ventricular size is normal. Tricuspid regurgitation signal is inadequate for assessing PA pressure. 3. The mitral valve is normal in structure. Trivial mitral valve regurgitation. No  evidence of mitral stenosis. 4. The aortic valve is grossly normal. Unable to determine aortic valve morphology due to image quality. Aortic valve regurgitation is trivial. No aortic stenosis is present. 5. The inferior vena cava is normal in size with greater than 50% respiratory variability, suggesting right atrial pressure of 3 mmHg.  FINDINGS Left Ventricle: Left ventricular ejection fraction, by estimation, is 60 to 65%. The left ventricle has normal function. Left ventricular endocardial border not optimally defined to evaluate regional wall motion. The left ventricular internal cavity size was normal in size. There is no left ventricular hypertrophy. Left ventricular diastolic parameters were normal.  Right Ventricle: The right ventricular size is normal. No increase in right ventricular wall thickness. Right ventricular systolic function is normal. Tricuspid regurgitation signal is inadequate for assessing PA pressure.  Left Atrium: Left atrial size was normal in size.  Right Atrium: Right atrial size was normal in size.  Pericardium: There is no evidence of pericardial effusion.  Mitral Valve: The mitral valve is normal in structure. Trivial mitral valve regurgitation. No  evidence of mitral valve stenosis.  Tricuspid Valve: The tricuspid valve is normal in structure. Tricuspid valve regurgitation is not demonstrated. No evidence of tricuspid stenosis.  Aortic Valve: The aortic valve is grossly normal. Aortic valve regurgitation is trivial. No aortic stenosis is present. Aortic valve mean gradient measures 4.0 mmHg. Aortic valve peak gradient measures 7.6 mmHg. Aortic valve area, by VTI measures 2.43 cm.  Pulmonic Valve: The pulmonic valve was normal in structure. Pulmonic valve regurgitation is not visualized. No evidence of pulmonic stenosis.  Aorta: The aortic root is normal in size and structure.  Venous: The inferior vena cava is normal in size with greater than 50%  respiratory variability, suggesting right atrial pressure of 3 mmHg.  IAS/Shunts: No atrial level shunt detected by color flow Doppler.   LEFT VENTRICLE PLAX 2D LVIDd:         5.00 cm   Diastology LVIDs:         2.80 cm   LV e' medial:    8.92 cm/s LV PW:         0.80 cm   LV E/e' medial:  9.9 LV IVS:        1.20 cm   LV e' lateral:   12.00 cm/s LVOT diam:     1.90 cm   LV E/e' lateral: 7.3 LV SV:         81 LV SV Index:   38 LVOT Area:     2.84 cm   RIGHT VENTRICLE RV Basal diam:  3.20 cm RV Mid diam:    2.50 cm RV S prime:     15.90 cm/s TAPSE (M-mode): 2.8 cm  LEFT ATRIUM             Index        RIGHT ATRIUM           Index LA diam:        3.60 cm 1.68 cm/m   RA Area:     12.10 cm LA Vol (A2C):   39.7 ml 18.53 ml/m  RA Volume:   27.30 ml  12.74 ml/m LA Vol (A4C):   41.2 ml 19.23 ml/m LA Biplane Vol: 40.9 ml 19.09 ml/m AORTIC VALVE AV Area (Vmax):    2.57 cm AV Area (Vmean):   2.52 cm AV Area (VTI):     2.43 cm AV Vmax:           138.00 cm/s AV Vmean:          94.100 cm/s AV VTI:            0.335 m AV Peak Grad:      7.6 mmHg AV Mean Grad:      4.0 mmHg LVOT Vmax:         125.00 cm/s LVOT Vmean:        83.800 cm/s LVOT VTI:          0.287 m LVOT/AV VTI ratio: 0.86  AORTA Ao Root diam: 3.00 cm Ao Asc diam:  3.00 cm  MITRAL VALVE MV Area (PHT): 3.27 cm    SHUNTS MV Decel Time: 232 msec    Systemic VTI:  0.29 m MV E velocity: 88.00 cm/s  Systemic Diam: 1.90 cm MV A velocity: 88.00 cm/s MV E/A ratio:  1.00  Weston Brass MD Electronically signed by Weston Brass MD Signature Date/Time: 11/16/2023/5:34:59 PM    Final      CT SCANS  CT CORONARY MORPH W/CTA COR W/SCORE 01/04/2024  Addendum 01/13/2024  11:46 AM ADDENDUM REPORT: 01/13/2024 11:43  EXAM: OVER-READ INTERPRETATION  CT CHEST  The following report is an over-read performed by radiologist Dr. Narda Rutherford of [facility] on 01/13/2024. This over-read does not  include interpretation of cardiac or coronary anatomy or pathology. The coronary CTA interpretation by the cardiologist is attached.  COMPARISON:  Chest CT 06/18/2023  FINDINGS: Vascular: No aortic atherosclerosis. The included aorta is normal in caliber.  Mediastinum/nodes: No adenopathy or mass. Unremarkable esophagus.  Lungs: No focal airspace disease. No pulmonary nodule. No pleural fluid. The included airways are patent.  Upper abdomen: No acute or unexpected findings.  Musculoskeletal: There are no acute or suspicious osseous abnormalities. Mild thoracic spondylosis.  IMPRESSION: No acute or unexpected extracardiac findings.   Electronically Signed By: Narda Rutherford M.D. On: 01/13/2024 11:43  Narrative CLINICAL DATA:  Chest pain  EXAM: Cardiac CTA  MEDICATIONS: Sub lingual nitro. 4mg  x 2  TECHNIQUE: The patient was scanned on a Siemens 192 slice scanner. Gantry rotation speed was 250 msecs. Collimation was 0.6 mm. A 100 kV prospective scan was triggered in the ascending thoracic aorta at 35-75% of the R-R interval. Average HR during the scan was 60 bpm. The 3D data set was interpreted on a dedicated work station using MPR, MIP and VRT modes. A total of 80cc of contrast was used.  FINDINGS: Non-cardiac: See separate report from [facility].  No LA appendage thrombus. Pulmonary veins drain normally to the left atrium.  Calcium Score: 0 Agatston units.  Coronary Arteries: Right dominant with no anomalies  LM: No plaque or stenosis.  LAD system:  No plaque or stenosis.  Circumflex system: No plaque or stenosis. Difficult images of distal LCx due to artifact.  RCA system: No plaque or stenosis. Difficult images of distal RCA due to artifact.  IMPRESSION: 1. Coronary artery calcium score 0 Agatston units. This suggests low risk for future cardiac events.  2. No plaque or stenosis noted in the coronary tree though motion artifact led to  difficult distal RCA and LCx images.  [employer] account executive  Electronically Signed: By: Marca Ancona M.D. On: 01/04/2024 14:32     ______________________________________________________________________________________________       Current Reported Medications:.    Current Meds  Medication Sig   acetaminophen (TYLENOL) 500 MG tablet Take 2 tablets (1,000 mg total) by mouth every 8 (eight) hours as needed.   ALPRAZolam (XANAX) 0.5 MG tablet Take 1 tablet (0.5 mg total) by mouth daily as needed for anxiety. (Patient taking differently: Take 0.5 mg by mouth See admin instructions. Take 0.5 mg by mouth at bedtime and an additional 0.5 mg once a day as needed for panic or anxiety)   diltiazem (CARDIZEM CD) 120 MG 24 hr capsule Take 120 mg by mouth daily.   DULoxetine (CYMBALTA) 60 MG capsule Take 1 capsule (60 mg total) by mouth daily. (Patient taking differently: Take 60 mg by mouth at bedtime.)   losartan (COZAAR) 25 MG tablet Take 25 mg by mouth daily.   senna-docusate (SENOKOT-S) 8.6-50 MG tablet Take 1 tablet by mouth 2 (two) times daily.   Vitamin D, Ergocalciferol, (DRISDOL) 1.25 MG (50000 UNIT) CAPS capsule Take 1 capsule (50,000 Units total) by mouth every 7 (seven) days.   Physical Exam:    VS:  BP 128/84   Pulse 88   Ht 5\' 3"  (1.6 m)   Wt 251 lb 12.8 oz (114.2 kg)   SpO2 95%   BMI 44.60 kg/m  Wt Readings from Last 3 Encounters:  02/19/24 251 lb 12.8 oz (114.2 kg)  02/01/24 248 lb (112.5 kg)  11/16/23 254 lb 8 oz (115.4 kg)    GEN: Well nourished, well developed in no acute distress NECK: No JVD; No carotid bruits CARDIAC: RRR, no murmurs, rubs, gallops RESPIRATORY:  Clear to auscultation without rales, wheezing or rhonchi  ABDOMEN: Soft, non-tender, non-distended EXTREMITIES:  No edema; No acute deformity   Asessement and Plan:.    Chest pain: Echocardiogram in 10/2023 indicated LVEF of 60 to 65%, wall motion was unable to be reviewed given endocardial border not  optimally defined, diastolic parameters were normal.  There were no significant valvular abnormalities.  CRP was mildly elevated at 1.2 and sed rate was 11. Coronary CTA on 01/04/2024 indicated a coronary artery calcium score of 0.  Today patient notes that she continues to have intermittent chest discomfort that she associates with SVT and PVCs.  Discussed having patient wear a cardiac monitor however she notes that her symptoms are overall infrequent, deferred at this time.  Encouraged patient to have sleep study, as noted below. Reviewed ED precautions. Check CBC and CMET.   Paroxysmal SVT: Diagnosed with SVT in her 20s, maintained on Cardizem 120 mg daily.  Patient notes that she does have occasional episodes of what she feels is SVT and PVCs.  She does not feel that this is overall changed since 2013, she can have some associated chest discomfort and shortness of breath with symptoms.  Discussed having patient wear cardiac monitor, deferred as patient notes that her episodes have been overall infrequent recently.  Continue Cardizem 120 mg daily.  Hypertension: Blood pressure today 137/85, on recheck was 128/84.  Continue losartan.  Vaping: Patient reports that she currently vapes daily, discussed that this could be contributing to her shortness of breath.  Cessation encouraged.  Sleep disordered breathing: Patient with STOP-BANG of 7.  Patient deferred in lab sleep study, and was agreeable to [facility] sleep study.     Disposition: F/u with Reather Littler, NP in 2-3 months or sooner if needed.   Signed, Rip Harbour, NP

## 2024-02-19 ENCOUNTER — Ambulatory Visit: Payer: BC Managed Care – PPO | Attending: Cardiology | Admitting: Cardiology

## 2024-02-19 ENCOUNTER — Encounter: Payer: Self-pay | Admitting: Cardiology

## 2024-02-19 VITALS — BP 128/84 | HR 88 | Ht 63.0 in | Wt 251.8 lb

## 2024-02-19 DIAGNOSIS — I471 Supraventricular tachycardia, unspecified: Secondary | ICD-10-CM | POA: Diagnosis not present

## 2024-02-19 DIAGNOSIS — R072 Precordial pain: Secondary | ICD-10-CM | POA: Diagnosis not present

## 2024-02-19 DIAGNOSIS — G4730 Sleep apnea, unspecified: Secondary | ICD-10-CM

## 2024-02-19 DIAGNOSIS — I10 Essential (primary) hypertension: Secondary | ICD-10-CM

## 2024-02-19 DIAGNOSIS — Z720 Tobacco use: Secondary | ICD-10-CM

## 2024-02-19 LAB — CBC

## 2024-02-19 NOTE — Patient Instructions (Signed)
 Medication Instructions:  No changes *If you need a refill on your cardiac medications before your next appointment, please call your pharmacy*  Lab Work: Today we are going to draw Cmet and CBC If you have labs (blood work) drawn today and your tests are completely normal, you will receive your results only by: MyChart Message (if you have MyChart) OR A paper copy in the mail If you have any lab test that is abnormal or we need to change your treatment, we will call you to review the results.  Testing/Procedures: WatchPAT? is an FDA-cleared portable home sleep study test that uses a watch and 3 points of contact to monitor 7 different channels, including your heart rate, oxygen saturation, body position, snoring, and chest motion.  The study is easy to use from the comfort of your own home and accurately detect sleep apnea.  Before bed, you attach the chest sensor, attached the sleep apnea bracelet to your nondominant hand, and attach the finger probe.  After the study, the raw data is downloaded from the watch and scored for apnea events.   For more information: [email or web address]  Patient Testing Instructions:  Once our office has received insurance approval for you to complete this test, we will contact you with a PIN to activate the device.  This typically takes 2-3 weeks.  Please do not open the box until approved.   Do not put battery into the device until bedtime when you are ready to begin the test. Please call the support number if you need assistance after following the instructions below: 24 hour support line- [phone] or [facility] support at [phone] (option 2)  Download the [facility] One" app through the [employer] or [employer]. Be sure to turn on or enable access to Bluetooth in settings on your smartphone. Make sure no other Bluetooth devices are on and within the vicinity of your smartphone and WatchPAT watch during testing.  Make  sure to leave your smart phone plugged in and charging all night.  When ready for bed:  Follow the instructions step by step in the WatchPAT One app to activate the testing device. For additional instructions, including video instruction, visit the WatchPAT One video on Youtube. You can search for WatchPAT One within Youtube (video is 4 minutes and 18 seconds) or enter: https://youtube/watch?[email or web address]_vbiwxE Please note: You will be prompted to enter a PIN to connect via Bluetooth when starting the test. The PIN will be assigned to you after insurance has approved the test.  The device is disposable, but it recommended that you retain the device until you receive a call letting you know the study has been received and the results have been interpreted.  We will let you know if the study did not transmit to [place] properly after the test is completed. You do not need to call us to confirm the receipt of the test.  Please complete the test within 48 hours of receiving PIN.   Frequently Asked Questions:  What is Watch PAT One?  A single use, fully disposable home sleep apnea testing device and will not need to be returned after completion.  What are the requirements to use WatchPAT One?  A successful WatchPAT One sleep study requires a WatchPAT One device, your smart phone, WatchPAT One app, your PIN number, and internet access. What type of phone do I need?  You should have a smart phone that uses Android 5.1 and above or any iPhone with IOS 10 and  above. How can I download the WatchPAT one app?  Based on your device type search for WatchPAT One app either in [employer] for [employer] or [employer] for [employer]. Where will I get my PIN for the study?  Your PIN will be provided by your physician's office after insurance has approved the test. This process typically takes 2-3 weeks. It is used for authentication and if you lose/forget your PIN, please reach out to your provider's office.  I  do not have internet at home. Can I still complete a WatchPAT One study?  WatchPAT One needs internet connection throughout the night to be able to transmit the sleep data. You can use your home/local internet or your cellular data package. However, it is always recommended to use home/local internet. It is estimated that between 20MB-30MB of data will be used with each study, but the application will be looking for space in the phone to start the study.  What happens if I lose internet or Bluetooth connection?  During the internet disconnection, your phone will not be able to transmit the sleep data.  All the data, will be stored in your phone.  As soon as the internet connection is back on, the phone will resume sending the sleep data. During the Bluetooth disconnection, WatchPAT One will not be able to to send the sleep data to your phone.  Data will be kept in the WatchPAT One until both devices have Bluetooth connection back on.  As soon as the connection is back on, WatchPAT one will send the sleep data to the phone.  How long do I need to wear the WatchPAT one?  After you start the study, you should wear the device at least 6 hours.  How far should I keep my phone from the device?  During the night, your phone should remain within 15 feet of where you sleep.  What happens if I leave the room for restroom or other reasons?  Leaving the room for any reason will not cause any problem. As soon as your get back to the room, both devices will reconnect and will continue to send the sleep data. Can I use my phone during the sleep study?  Yes, you can use your phone as usual during the study. But it is recommended to put your WatchPAT One on when you are ready to go to bed.  How will I get my study results?  A soon as you completed your study, your sleep data will be sent to the provider. They will then share the results with you when they are ready.   Follow-Up: At [facility], you  and your health needs are our priority.  As part of our continuing mission to provide you with exceptional heart care, we have created designated Provider Care Teams.  These Care Teams include your primary Cardiologist (physician) and Advanced Practice Providers (APPs -  Physician Assistants and Nurse Practitioners) who all work together to provide you with the care you need, when you need it.  We recommend signing up for the patient portal called "MyChart".  Sign up information is provided on this After Visit Summary.  MyChart is used to connect with patients for Virtual Visits (Telemedicine).  Patients are able to view lab/test results, encounter notes, upcoming appointments, etc.  Non-urgent messages can be sent to your provider as well.   To learn more about what you can do with MyChart, go to [email or web address].  Your next appointment:   2 month(s)  Provider:   Parke Poisson, MD  or Reather Littler, NP

## 2024-02-20 LAB — CBC
Hematocrit: 43.2 % (ref 34.0–46.6)
Hemoglobin: 14.3 g/dL (ref 11.1–15.9)
MCH: 31 pg (ref 26.6–33.0)
MCHC: 33.1 g/dL (ref 31.5–35.7)
MCV: 94 fL (ref 79–97)
Platelets: 263 10*3/uL (ref 150–450)
RBC: 4.62 x10E6/uL (ref 3.77–5.28)
RDW: 12.8 % (ref 11.7–15.4)
WBC: 6.3 10*3/uL (ref 3.4–10.8)

## 2024-02-20 LAB — COMPREHENSIVE METABOLIC PANEL
ALT: 21 [IU]/L (ref 0–32)
AST: 14 [IU]/L (ref 0–40)
Albumin: 4.1 g/dL (ref 3.8–4.9)
Alkaline Phosphatase: 208 [IU]/L — ABNORMAL HIGH (ref 44–121)
BUN/Creatinine Ratio: 10 (ref 9–23)
Bilirubin Total: 0.5 mg/dL (ref 0.0–1.2)
CO2: 23 mmol/L (ref 20–29)
Calcium: 9.5 mg/dL (ref 8.7–10.2)
Chloride: 103 mmol/L (ref 96–106)
Creatinine, Ser: 0.81 mg/dL (ref 0.57–1.00)
Globulin, Total: 2.3 g/dL (ref 1.5–4.5)
Globulin, Total: 8 mg/dL (ref 6–4.5)
Glucose: 118 mg/dL — ABNORMAL HIGH (ref 70–99)
Potassium: 4.1 mmol/L (ref 3.5–5.2)
Sodium: 140 mmol/L (ref 134–144)
Total Protein: 6.4 g/dL (ref 6.0–8.5)
eGFR: 87 mL/min/{1.73_m2} (ref >59)

## 2024-02-26 ENCOUNTER — Telehealth: Payer: Self-pay

## 2024-02-26 NOTE — Telephone Encounter (Signed)
 Patient phone number is a work phone. Left message to call back

## 2024-02-26 NOTE — Telephone Encounter (Signed)
-----   Message from Rip Harbour sent at 02/20/2024 11:59 AM EST ----- Please let Tiffany [name] know that her CBC shows no evidence of anemia or infection. Her kidney function and electrolytes are normal. Her alkaline phosphatase is elevated, it appears to have been previously elevated, recent elevation could be related to her recent procedure. Recommend she follow up with her PCP for ongoing monitoring.

## 2024-02-27 ENCOUNTER — Telehealth: Payer: Self-pay

## 2024-02-27 NOTE — Telephone Encounter (Signed)
 Left message to call back on spouses phone.

## 2024-02-27 NOTE — Telephone Encounter (Signed)
 This RN returned voicemail from Ms.Urich today requesting to schedule appt with Benedict Needy at [facility]. This RN spoke with Ms. Shea Evans. Per the Ms.Devera, PCP was placing referral for hematology d/t worsening lymph node swelling, extreme fatigue, constant sweats, and malaise. She called wanting schedule an appt with Benedict Needy. This RN informed Ms. Chervenak that I would let Jae Dire know, but that once the referral was received she would be scheduled appropriately. Pt verbalized understanding.

## 2024-02-28 ENCOUNTER — Telehealth: Payer: Self-pay | Admitting: Medical Oncology

## 2024-02-28 ENCOUNTER — Encounter: Payer: Self-pay | Admitting: Medical Oncology

## 2024-02-28 NOTE — Progress Notes (Signed)
 Outgoing call:  LVMOM with patient regarding her vm, for a referral, left with Vista Deck, RN.  My direct call back number provided.   Rexene Edison, RN, BSN, [facility] Nurse Navigator, [facility] 02/28/2024 9:53 AM

## 2024-02-28 NOTE — Telephone Encounter (Signed)
 Outgoing call: Spoke with patient regarding request to be seen at clinic.  Patient returned my call and stated that she feels her symptoms had worsened, states an "increase in supraclavicular nodes since this morning, under jawline, right forearm and under right arm swelling." Patient also states that she has "extreme fatigue," which she reports has worsened and reports sweats without fever. I reviewed new information with Karie Fetch. PA-C. Patient reports a referral has been made from her PCP to hematology. I informed patient that we have not received a referral and suggested for her to reach out to her PCP regarding referral.  Patient was informed that there are two pending referrals, one for neurology and the other is a sleep study.  Patient gave her verbal understanding and states she will reach out to her PCP to confirm a referral was made. Patient thanked and encouraged to call with questions. Gregary Cromer, RN, BSN, [facility] Nurse Navigator, [facility] 02/28/2024 12:38 PM

## 2024-03-03 NOTE — Telephone Encounter (Signed)
 Called patient advised of below they verbalized understanding Patient has not yet received their pin for the monitor will reach out to [facility] on this.

## 2024-03-13 NOTE — Telephone Encounter (Signed)
 Ordering provider: K. West Associated diagnoses: Precordial Chest Pain, Essential Hypertension, SVT, Sleep-disordered Breathing WatchPAT PA obtained on 03/13/2024 by Brunetta Genera, LPN. Authorization: Yes; tracking ID No PA Required Patient notified of PIN ([id number]) on 03/13/2024 via Notification Method: phone.

## 2024-04-14 IMAGING — DX DG CHEST 2V
2 series · 2 of 2 positions shown · non-contrast
Comparison: Chest x-ray 11/25/2021.

CLINICAL DATA: Chest pain and shortness of breath.

EXAM:
CHEST - 2 VIEW

[chest pa]
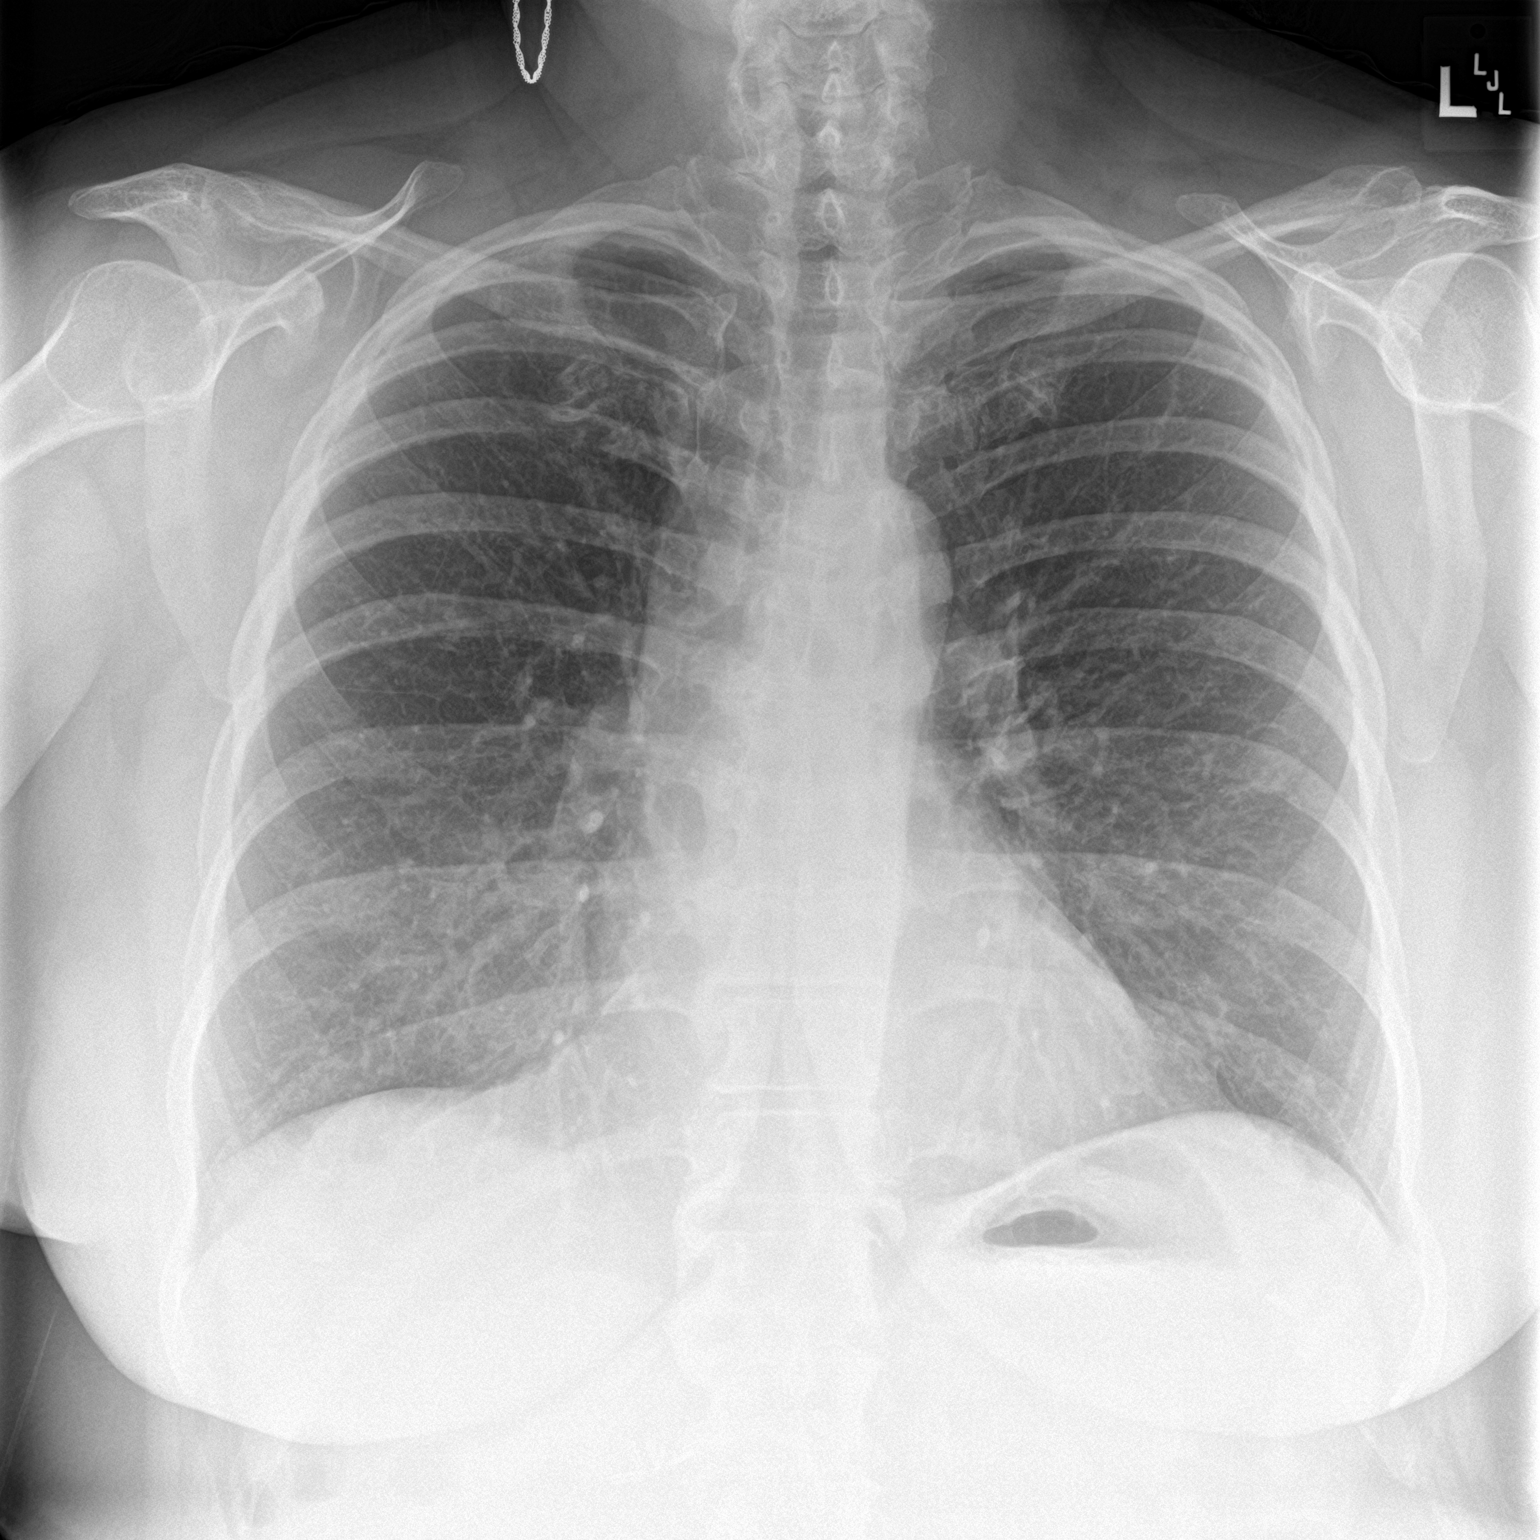

[chest lat]
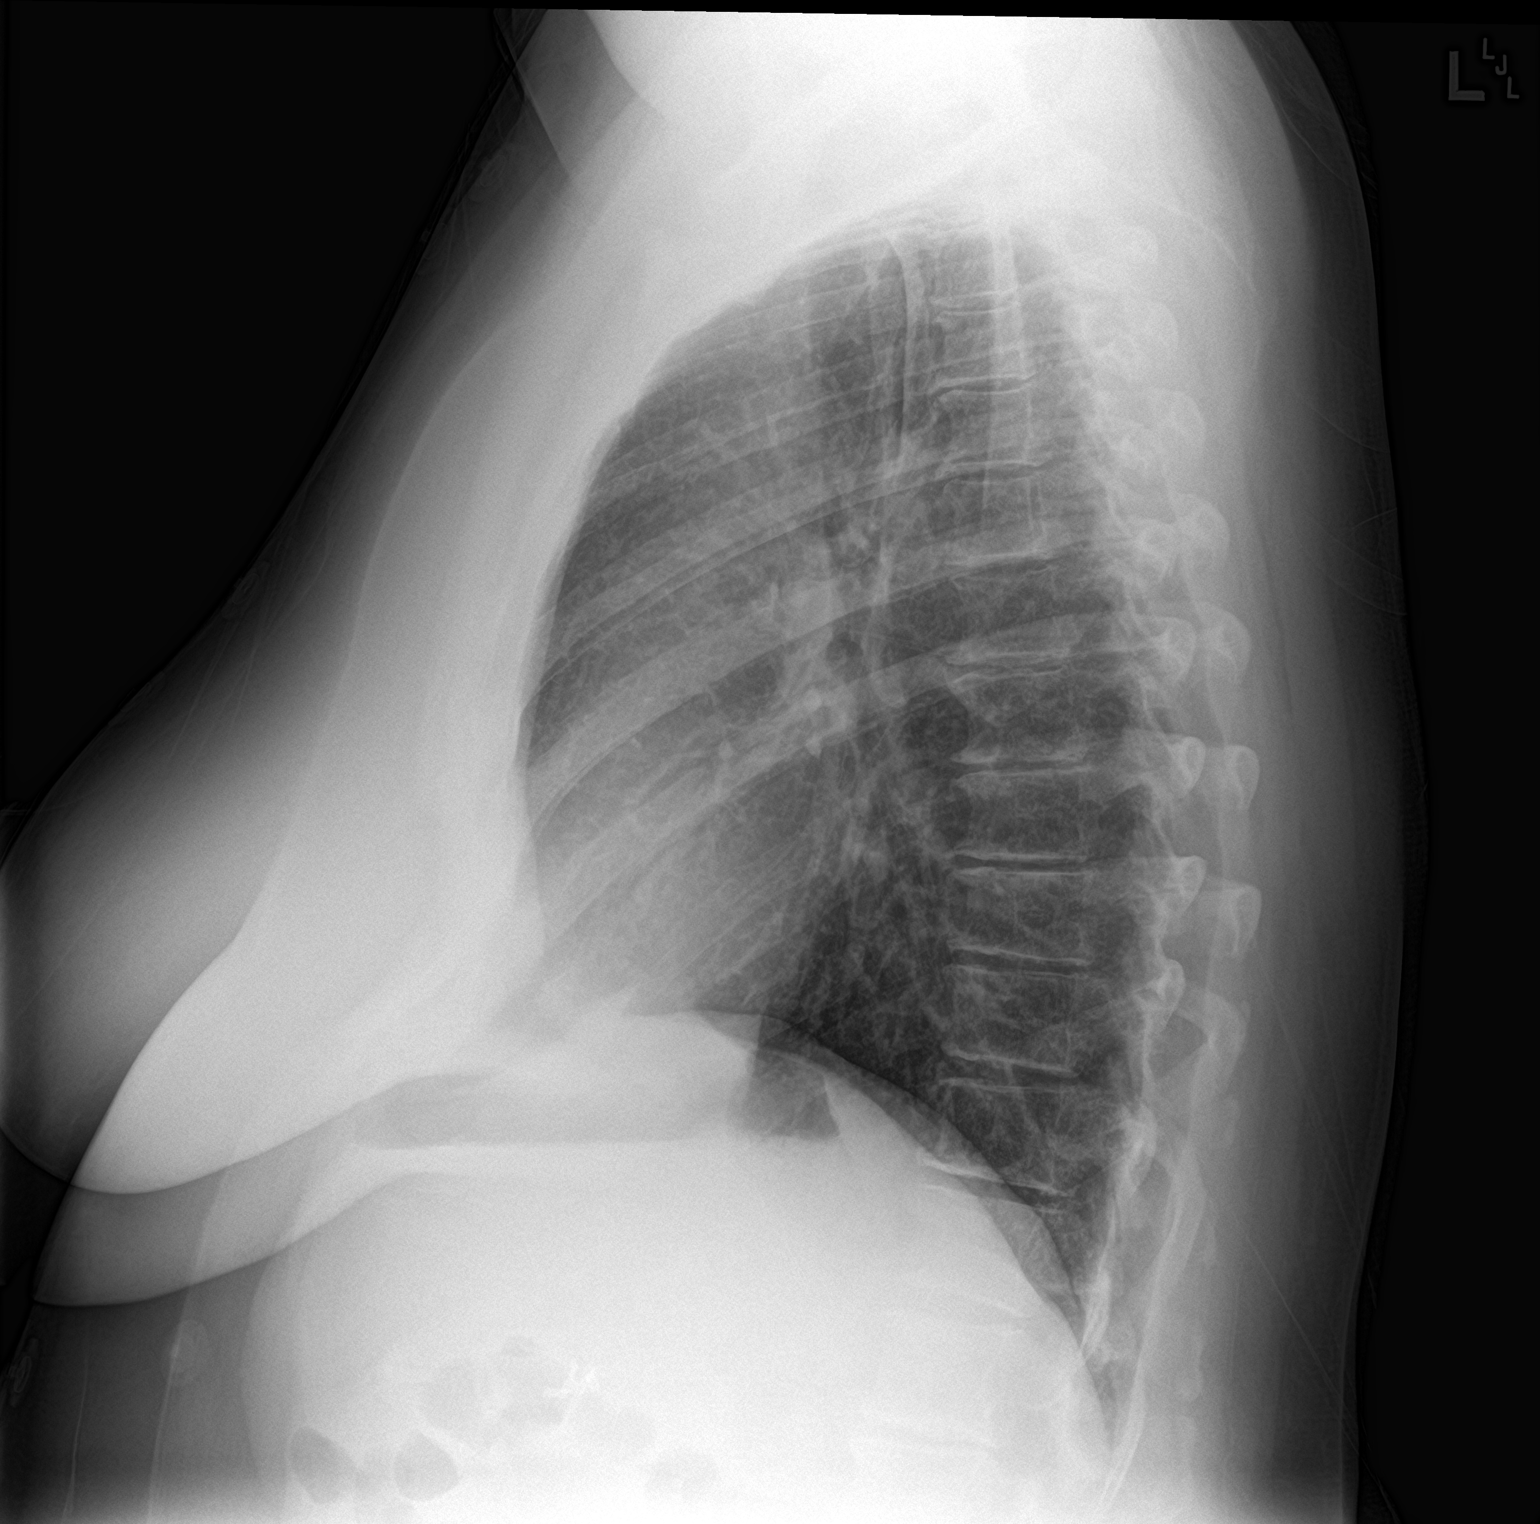

[2 of 2 positions shown; findings below may reference images not displayed]

FINDINGS: The heart size and mediastinal contours are within normal limits.
Both lungs are clear. The visualized skeletal structures are
unremarkable.
IMPRESSION: No active cardiopulmonary disease.

## 2024-04-14 NOTE — Progress Notes (Deleted)
 Cardiology Office Note    Date:  04/16/2024  ID:  Tiffany [name], DOB 12/13/1971, MRN [id number] PCP:  Tiffany Salmon, FNP  Cardiologist:  Tiffany Herrlich, MD  Electrophysiologist:  None   Chief Complaint: ***  History of Present Illness: .    Tiffany [name] is a 53 y.o. female with visit-pertinent history of SVT, anxiety, hypertension.   First evaluated by Tiffany [name] on 11/14/2023 at the request of her PCP.  Patient was noted to have been diagnosed with SVT in her 59s, maintained on Cardizem  120 mg daily.  In June 2024 she was evaluated for lymphadenopathy and reports of night sweats for 1 month prior.  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  CT chest 06/14/2023 negative for lymphadenopathy and no evidence of intrathoracic malignancy.  It was noted that aortic atherosclerosis was mention.  On 11/12/2023 she saw her PCP with complaints of chest pain.  She noted chest pain radiated to her shoulders and back and stated it felt like SVT pain but had not had SVT.  Isosorbide had not relieved her chest pain.  On 11/15/2023 she presented to [facility] ED with 1 month of exertional left-sided chest pain.  It was noted chest pain was accompanied by shortness of breath, diaphoresis and nausea.  She also noted intermittent palpitations.  On initial evaluation troponin was negative x 2, EKG nonischemic, D-dimer was negative.  She was admitted to medicine service and cardiology was consulted.  Patient reported that she was having chest pain 1-3 times weekly and was relieved when she lay down on her left side.  She noted past pain had progressed in intensity and frequency.  She fell up to episodes of severe left-sided chest pain that radiated to both shoulders, her back, associate with headache, shortness of breath, diaphoresis and nausea.  Echocardiogram indicated LVEF of 60 to 65%, wall motion was unable to be reviewed given endocardial border not optimally defined, diastolic parameters were  normal.  There were no significant valvular abnormalities.  CRP was mildly elevated at 1.2 and sed rate was 11.  It was recommended that she undergo outpatient coronary CTA and start colchicine  0.6 mg total twice daily for 15 days. Coronary CTA on 01/04/2024 indicated a coronary artery calcium score of 0.  On 02/05/2024 she presented to the ED with complaints of right upper abdominal pain.  CT imaging of the abdomen revealed evidence of small genial displacement into the right hemiabdomen with short segment small bowel intussusception but no bowel obstruction.  General surgery was consulted and recommended observation in the hospital with repeat CT imaging of the abdomen the following day.  She was started on clear liquid diet.  Hospital course was complicated by bouts of atypical chest pain and complaints of intermittent right sided neurologic complaints such as numbness and weakness, not felt to be related to CVA.  CT scan was negative and prior MRI and EEG was unremarkable.  Brain MRI was negative.  Patient underwent diagnostic laparoscopy which showed left-sided ovarian cyst and was otherwise unremarkable.  Patient was discharged on 02/05/2024 in stable condition.  Patient was last seen in clinic on 02/19/2024.  She reports that she was overall doing okay, reported that she continue to have occasional chest discomfort that she feels is related to her SVT.  Patient noted history of chest discomfort with her SVT and PVCs since 2013.  Chest discomfort: Echocardiogram in 10/2023 indicated LVEF of 60 to 65%, wall motion was unable  to be reviewed given endocardial border not optimally defined, diastolic parameters were normal.  There were no significant valvular abnormalities.  CRP was mildly elevated at 1.2 and sed rate was 11. Coronary CTA on 01/04/2024 indicated a coronary artery calcium score of 0.  Today she reports  Cardiac monitor?   Paroxysmal SVT: Diagnosed with SVT in her 20s, maintained on Cardizem  120  mg daily.  Today she reports   Hypertension: Blood pressure today   Vaping: Patient reports that she currently   Sleep disordered breathing: [facility] sleep study pending.   Labwork independently reviewed:   ROS: .   *** denies chest pain, shortness of breath, lower extremity edema, fatigue, palpitations, melena, hematuria, hemoptysis, diaphoresis, weakness, presyncope, syncope, orthopnea, and PND.  All other systems are reviewed and otherwise negative.  Studies Reviewed: Tiffany [name]    EKG:  EKG is ordered today, personally reviewed, demonstrating ***     CV Studies: Cardiac studies reviewed are outlined and summarized above. Otherwise please see EMR for full report. Cardiac Studies & Procedures   ______________________________________________________________________________________________     ECHOCARDIOGRAM  ECHOCARDIOGRAM COMPLETE 11/16/2023  Narrative ECHOCARDIOGRAM REPORT    Patient Name:   Tiffany [name] Date of Exam: 11/16/2023 Medical Rec #:  [id number]   Height:       63.0 in Accession #:    [id number]  Weight:       254.5 lb Date of Birth:  February 22, 1971    BSA:          2.142 m Patient Age:    52 years    BP:           126/57 mmHg Patient Gender: F           HR:           58 bpm. Exam Location:  Inpatient  Procedure: 2D Echo, Color Doppler and Cardiac Doppler  Indications:    Chest Pain  History:        Patient has no prior history of Echocardiogram examinations. Arrythmias:h/o SVT; Signs/Symptoms:Chest Pain.  Sonographer:    Tiffany [name] Referring Phys: [id number] Tiffany [name]   Sonographer Comments: Technically difficult study due to poor echo windows. IMPRESSIONS   1. Left ventricular ejection fraction, by estimation, is 60 to 65%. The left ventricle has normal function. Left ventricular endocardial border not optimally defined to evaluate regional wall motion. Left ventricular diastolic parameters were normal. 2. Right ventricular systolic function is normal. The  right ventricular size is normal. Tricuspid regurgitation signal is inadequate for assessing PA pressure. 3. The mitral valve is normal in structure. Trivial mitral valve regurgitation. No evidence of mitral stenosis. 4. The aortic valve is grossly normal. Unable to determine aortic valve morphology due to image quality. Aortic valve regurgitation is trivial. No aortic stenosis is present. 5. The inferior vena cava is normal in size with greater than 50% respiratory variability, suggesting right atrial pressure of 3 mmHg.  FINDINGS Left Ventricle: Left ventricular ejection fraction, by estimation, is 60 to 65%. The left ventricle has normal function. Left ventricular endocardial border not optimally defined to evaluate regional wall motion. The left ventricular internal cavity size was normal in size. There is no left ventricular hypertrophy. Left ventricular diastolic parameters were normal.  Right Ventricle: The right ventricular size is normal. No increase in right ventricular wall thickness. Right ventricular systolic function is normal. Tricuspid regurgitation signal is inadequate for assessing PA pressure.  Left Atrium: Left atrial size was normal in size.  Right  Atrium: Right atrial size was normal in size.  Pericardium: There is no evidence of pericardial effusion.  Mitral Valve: The mitral valve is normal in structure. Trivial mitral valve regurgitation. No evidence of mitral valve stenosis.  Tricuspid Valve: The tricuspid valve is normal in structure. Tricuspid valve regurgitation is not demonstrated. No evidence of tricuspid stenosis.  Aortic Valve: The aortic valve is grossly normal. Aortic valve regurgitation is trivial. No aortic stenosis is present. Aortic valve mean gradient measures 4.0 mmHg. Aortic valve peak gradient measures 7.6 mmHg. Aortic valve area, by VTI measures 2.43 cm.  Pulmonic Valve: The pulmonic valve was normal in structure. Pulmonic valve regurgitation is  not visualized. No evidence of pulmonic stenosis.  Aorta: The aortic root is normal in size and structure.  Venous: The inferior vena cava is normal in size with greater than 50% respiratory variability, suggesting right atrial pressure of 3 mmHg.  IAS/Shunts: No atrial level shunt detected by color flow Doppler.   LEFT VENTRICLE PLAX 2D LVIDd:         5.00 cm   Diastology LVIDs:         2.80 cm   LV e' medial:    8.92 cm/s LV PW:         0.80 cm   LV E/e' medial:  9.9 LV IVS:        1.20 cm   LV e' lateral:   12.00 cm/s LVOT diam:     1.90 cm   LV E/e' lateral: 7.3 LV SV:         81 LV SV Index:   38 LVOT Area:     2.84 cm   RIGHT VENTRICLE RV Basal diam:  3.20 cm RV Mid diam:    2.50 cm RV S prime:     15.90 cm/s TAPSE (M-mode): 2.8 cm  LEFT ATRIUM             Index        RIGHT ATRIUM           Index LA diam:        3.60 cm 1.68 cm/m   RA Area:     12.10 cm LA Vol (A2C):   39.7 ml 18.53 ml/m  RA Volume:   27.30 ml  12.74 ml/m LA Vol (A4C):   41.2 ml 19.23 ml/m LA Biplane Vol: 40.9 ml 19.09 ml/m AORTIC VALVE AV Area (Vmax):    2.57 cm AV Area (Vmean):   2.52 cm AV Area (VTI):     2.43 cm AV Vmax:           138.00 cm/s AV Vmean:          94.100 cm/s AV VTI:            0.335 m AV Peak Grad:      7.6 mmHg AV Mean Grad:      4.0 mmHg LVOT Vmax:         125.00 cm/s LVOT Vmean:        83.800 cm/s LVOT VTI:          0.287 m LVOT/AV VTI ratio: 0.86  AORTA Ao Root diam: 3.00 cm Ao Asc diam:  3.00 cm  MITRAL VALVE MV Area (PHT): 3.27 cm    SHUNTS MV Decel Time: 232 msec    Systemic VTI:  0.29 m MV E velocity: 88.00 cm/s  Systemic Diam: 1.90 cm MV A velocity: 88.00 cm/s MV E/A ratio:  1.00  Grady Lawman MD  Electronically signed by Grady Lawman MD Signature Date/Time: 11/16/2023/5:34:59 PM    Final      CT SCANS  CT CORONARY MORPH W/CTA COR W/SCORE 01/04/2024  Addendum 01/13/2024 11:46 AM ADDENDUM REPORT: 01/13/2024  11:43  EXAM: OVER-READ INTERPRETATION  CT CHEST  The following report is an over-read performed by radiologist Dr. Chadwick Colonel of [facility] on 01/13/2024. This over-read does not include interpretation of cardiac or coronary anatomy or pathology. The coronary CTA interpretation by the cardiologist is attached.  COMPARISON:  Chest CT 06/18/2023  FINDINGS: Vascular: No aortic atherosclerosis. The included aorta is normal in caliber.  Mediastinum/nodes: No adenopathy or mass. Unremarkable esophagus.  Lungs: No focal airspace disease. No pulmonary nodule. No pleural fluid. The included airways are patent.  Upper abdomen: No acute or unexpected findings.  Musculoskeletal: There are no acute or suspicious osseous abnormalities. Mild thoracic spondylosis.  IMPRESSION: No acute or unexpected extracardiac findings.   Electronically Signed By: Chadwick Colonel M.D. On: 01/13/2024 11:43  Narrative CLINICAL DATA:  Chest pain  EXAM: Cardiac CTA  MEDICATIONS: Sub lingual nitro. 4mg  x 2  TECHNIQUE: The patient was scanned on a Siemens 192 slice scanner. Gantry rotation speed was 250 msecs. Collimation was 0.6 mm. A 100 kV prospective scan was triggered in the ascending thoracic aorta at 35-75% of the R-R interval. Average HR during the scan was 60 bpm. The 3D data set was interpreted on a dedicated work station using MPR, MIP and VRT modes. A total of 80cc of contrast was used.  FINDINGS: Non-cardiac: See separate report from [facility].  No LA appendage thrombus. Pulmonary veins drain normally to the left atrium.  Calcium Score: 0 Agatston units.  Coronary Arteries: Right dominant with no anomalies  LM: No plaque or stenosis.  LAD system:  No plaque or stenosis.  Circumflex system: No plaque or stenosis. Difficult images of distal LCx due to artifact.  RCA system: No plaque or stenosis. Difficult images of distal RCA due to  artifact.  IMPRESSION: 1. Coronary artery calcium score 0 Agatston units. This suggests low risk for future cardiac events.  2. No plaque or stenosis noted in the coronary tree though motion artifact led to difficult distal RCA and LCx images.  [employer] account executive  Electronically Signed: By: Peder Bourdon M.D. On: 01/04/2024 14:32     ______________________________________________________________________________________________       Current Reported Medications:.    No outpatient medications have been marked as taking for the 04/17/24 encounter (Appointment) with Shequita Peplinski D, NP.    Physical Exam:    VS:  There were no vitals taken for this visit.   Wt Readings from Last 3 Encounters:  02/19/24 251 lb 12.8 oz (114.2 kg)  02/01/24 248 lb (112.5 kg)  11/16/23 254 lb 8 oz (115.4 kg)    GEN: Well nourished, well developed in no acute distress NECK: No JVD; No carotid bruits CARDIAC: ***RRR, no murmurs, rubs, gallops RESPIRATORY:  Clear to auscultation without rales, wheezing or rhonchi  ABDOMEN: Soft, non-tender, non-distended EXTREMITIES:  No edema; No acute deformity     Asessement and Plan:.     ***     Disposition: F/u with ***  Signed, Itzayanna Kaster D Braydyn Schultes, NP

## 2024-04-17 ENCOUNTER — Ambulatory Visit: Payer: BC Managed Care – PPO | Attending: Cardiology | Admitting: Cardiology

## 2024-04-17 DIAGNOSIS — I471 Supraventricular tachycardia, unspecified: Secondary | ICD-10-CM

## 2024-04-17 DIAGNOSIS — Z720 Tobacco use: Secondary | ICD-10-CM

## 2024-04-17 DIAGNOSIS — I10 Essential (primary) hypertension: Secondary | ICD-10-CM

## 2024-04-17 DIAGNOSIS — G4730 Sleep apnea, unspecified: Secondary | ICD-10-CM

## 2024-04-17 DIAGNOSIS — R072 Precordial pain: Secondary | ICD-10-CM

## 2024-05-01 ENCOUNTER — Telehealth: Payer: Self-pay

## 2024-05-01 NOTE — Telephone Encounter (Signed)
**Note De-Identified Seaira Byus Obfuscation** Ordering provider: Katlyn West, NP Associated diagnoses: Snoring-R06.83 and Fatigue-R53.83  WatchPAT PA obtained on 05/01/2024 by Araeya Lamb, Isabella Mao, LPN. Authorization: Per the [employer] provider portal: The following solutions for the service date entered do not require Pre-Authorization by [employer]: CPT Code: 28413 ([facility]-HST).  Patient notified of PIN ([id number]) on 05/01/2024 Jelena Malicoat Notification Method: phone.  Phone note routed to covering staff for follow-up.

## 2024-05-13 ENCOUNTER — Emergency Department (HOSPITAL_COMMUNITY)
Admission: EM | Admit: 2024-05-13 | Discharge: 2024-05-13 | Disposition: A | Discharge status: Home / Self Care | Attending: Emergency Medicine | Admitting: Emergency Medicine

## 2024-05-13 ENCOUNTER — Other Ambulatory Visit: Payer: Self-pay

## 2024-05-13 ENCOUNTER — Encounter (HOSPITAL_COMMUNITY): Payer: Self-pay

## 2024-05-13 DIAGNOSIS — H7291 Unspecified perforation of tympanic membrane, right ear: Secondary | ICD-10-CM | POA: Insufficient documentation

## 2024-05-13 DIAGNOSIS — H9211 Otorrhea, right ear: Secondary | ICD-10-CM | POA: Diagnosis present

## 2024-05-13 LAB — CBC WITH DIFFERENTIAL/PLATELET
Abs Immature Granulocytes: 0.04 10*3/uL (ref 0.00–0.07)
Basophils Absolute: 0 10*3/uL (ref 0.0–0.1)
Basophils Relative: 1 %
Eosinophils Absolute: 0.2 10*3/uL (ref 0.0–0.5)
Eosinophils Relative: 2 %
HCT: 43.9 % (ref 36.0–46.0)
Hemoglobin: 14.9 g/dL (ref 12.0–15.0)
Immature Granulocytes: 1 %
Lymphocytes Relative: 25 %
Lymphs Abs: 1.9 10*3/uL (ref 0.7–4.0)
MCH: 31 pg (ref 26.0–34.0)
MCHC: 33.9 g/dL (ref 30.0–36.0)
MCV: 91.5 fL (ref 80.0–100.0)
Monocytes Absolute: 0.4 10*3/uL (ref 0.1–1.0)
Monocytes Relative: 5 %
Neutro Abs: 5.2 10*3/uL (ref 1.7–7.7)
Neutrophils Relative %: 66 %
Platelets: 232 10*3/uL (ref 150–400)
RBC: 4.8 MIL/uL (ref 3.87–5.11)
RDW: 12.2 % (ref 11.5–15.5)
WBC: 7.8 10*3/uL (ref 4.0–10.5)
nRBC: 0 % (ref 0.0–0.2)

## 2024-05-13 LAB — PROTIME-INR
INR: 0.9 (ref 0.8–1.2)
Prothrombin Time: 12.7 s (ref 11.4–15.2)

## 2024-05-13 MED ORDER — GABAPENTIN 300 MG PO CAPS: 300.0000 mg | ORAL_CAPSULE | Freq: Three times a day (TID) | ORAL | 0 refills | Status: DC | PRN

## 2024-05-13 NOTE — ED Triage Notes (Addendum)
 Pt arrives via POV. Pt reports she noticed bleeding in her right ear this morning, no bleeding at this time. Denies any injury to ear or foreign bodies.  She states for the past week she has been experiencing right sided facial pain and intermittent nose bleeds. Pt states she is seeing an ENT oncologist at [facility], but has not had any imaging completed yet. Pt is AxOx4.

## 2024-05-13 NOTE — ED Provider Notes (Signed)
Tiffany [name] Provider Note   CSN: [id number] Arrival date & time: 05/13/24  1019     History  Chief Complaint  Patient presents with   Facial Pain    Tiffany [name] is a 53 y.o. female presenting today with concern for diminished hearing and bleeding from her right ear as well as recurring bleeds from the right side of her nose.  The patient is being worked up by an ENT oncologist at [facility] for swelling of the lymph nodes and pain on the right side of her face ongoing for some time.  She has had recurring spontaneous bleeds from the right side of her nose persistently, and has seen ENT for this and reports she had a cauterization done in the past.  He says she has had about 4 bleeds over the past couple days on the right side of her nose.  They tend to stop on their own.  She was concerned because she had bleeding from her right ear earlier today as well.  She reports she has had intermittent loss of hearing from the right ear where "sometimes it will be normal and sometimes it suddenly cuts out".  Denies specific pain in her ear but reports that she has significant pain in the entire right half of her face that has been an ongoing issue for some time.  She took an oxycodone  from home and said it did nothing for her pain.  She is on Cymbalta  for chronic pain.  HPI     Home Medications Prior to Admission medications   Medication Sig Start Date End Date Taking? Authorizing Provider  gabapentin (NEURONTIN) 300 MG capsule Take 1 capsule (300 mg total) by mouth 3 (three) times daily as needed for up to 30 doses. 05/13/24  Yes Lyliana Dicenso, Janalyn Me, MD  acetaminophen  (TYLENOL ) 500 MG tablet Take 2 tablets (1,000 mg total) by mouth every 8 (eight) hours as needed. 02/05/24   Charlott Converse, PA-C  ALPRAZolam  (XANAX ) 0.5 MG tablet Take 1 tablet (0.5 mg total) by mouth daily as needed for anxiety. Patient taking differently: Take 0.5 mg by mouth See admin  instructions. Take 0.5 mg by mouth at bedtime and an additional 0.5 mg once a day as needed for panic or anxiety 10/03/16   English, Stephanie D, PA  diltiazem  (CARDIZEM  CD) 120 MG 24 hr capsule Take 120 mg by mouth daily.    [provider]  DULoxetine  (CYMBALTA ) 60 MG capsule Take 1 capsule (60 mg total) by mouth daily. Patient taking differently: Take 60 mg by mouth at bedtime. 10/03/16   Vivica Grounds D, PA  losartan  (COZAAR ) 25 MG tablet Take 25 mg by mouth daily.    [provider]  oxyCODONE  (OXY IR/ROXICODONE ) 5 MG immediate release tablet Take 1 tablet (5 mg total) by mouth every 6 (six) hours as needed for severe pain (pain score 7-10) (not releived by tylenol ). Patient not taking: Reported on 02/19/2024 02/05/24   Charlott Converse, PA-C  senna-docusate (SENOKOT-S) 8.6-50 MG tablet Take 1 tablet by mouth 2 (two) times daily. 02/05/24   Charlott Converse, PA-C      Allergies    Contrast media [iodinated contrast media], Stadol [butorphanol], and Beta adrenergic blockers    Review of Systems   Review of Systems  Physical Exam Updated Vital Signs BP (!) 171/105 (BP Location: Right Arm)   Pulse 90   Temp 97.8 F (36.6 C) (Oral)   Resp  18   Ht 5\' 3"  (1.6 m)   Wt 112 kg   SpO2 96%   BMI 43.75 kg/m  Physical Exam Constitutional:      General: She is not in acute distress. HENT:     Head: Normocephalic and atraumatic.     Comments: Perforated tympanic membrane of the right ear with small amount of bleeding; diminished hearing right ear Left TM intact, unremarkable No active bleeding from nares Eyes:     Conjunctiva/sclera: Conjunctivae normal.     Pupils: Pupils are equal, round, and reactive to light.  Cardiovascular:     Rate and Rhythm: Normal rate and regular rhythm.  Pulmonary:     Effort: Pulmonary effort is normal. No respiratory distress.  Skin:    General: Skin is warm and dry.  Neurological:     General: No focal deficit present.      Mental Status: She is alert. Mental status is at baseline.  Psychiatric:        Mood and Affect: Mood normal.        Behavior: Behavior normal.     ED Results / Procedures / Treatments   Labs (all labs ordered are listed, but only abnormal results are displayed) Labs Reviewed  CBC WITH DIFFERENTIAL/PLATELET  PROTIME-INR    EKG None  Radiology No results found.  Procedures Procedures    Medications Ordered in ED Medications - No data to display  ED Course/ Medical Decision Making/ A&P                                 Medical Decision Making Amount and/or Complexity of Data Reviewed Labs: ordered.   Patient is presenting with concern for bleeding from the right nose as well as the right ear.  Appears that she has a perforated TM of the right ear drum.  There was no clear traumatic nidus for this, but I wonder whether this may have been related to middle ear effusion.  I do not see clear evidence of infection or otitis externa.  Her hearing appears to be at least partially preserved on the right side.  She is not actively having nosebleeding at this time.  I will check her platelets and INR level to see if there is some reason that she may be hypercoagulable or higher bleeding risk at this time.  I reviewed these labs - no emergent findings  She is otherwise being worked up as an outpatient for her facial pain and neck swelling.  I reviewed her external records and she had an MRI of the brain performed in February which showed no acute intracranial process and scattered foci of T2 hyperintensity signals unchanged from prior imaging and not characteristic of demyelinating disease.  She will need further workup as an outpatient.  I did consider starting Neurontin or gabapentin 300 mg for potential trigeminal neuralgia (?) and pain in right side of the face, which she would like to try.         Final Clinical Impression(s) / ED Diagnoses Final diagnoses:  Perforation of  right tympanic membrane    Rx / DC Orders ED Discharge Orders          Ordered    gabapentin (NEURONTIN) 300 MG capsule  3 times daily PRN        05/13/24 1236              Arvilla Birmingham, MD 05/13/24  1236  

## 2024-06-15 ENCOUNTER — Emergency Department (HOSPITAL_COMMUNITY)

## 2024-06-15 ENCOUNTER — Emergency Department (HOSPITAL_COMMUNITY)
Admission: EM | Admit: 2024-06-15 | Discharge: 2024-06-15 | Disposition: A | Discharge status: Home / Self Care | Attending: Emergency Medicine | Admitting: Emergency Medicine

## 2024-06-15 ENCOUNTER — Encounter (HOSPITAL_COMMUNITY): Payer: Self-pay | Admitting: Emergency Medicine

## 2024-06-15 ENCOUNTER — Other Ambulatory Visit: Payer: Self-pay

## 2024-06-15 DIAGNOSIS — E876 Hypokalemia: Secondary | ICD-10-CM | POA: Insufficient documentation

## 2024-06-15 DIAGNOSIS — M25562 Pain in left knee: Secondary | ICD-10-CM | POA: Diagnosis present

## 2024-06-15 DIAGNOSIS — R000 Tachycardia, unspecified: Secondary | ICD-10-CM | POA: Diagnosis not present

## 2024-06-15 DIAGNOSIS — S8992XA Unspecified injury of left lower leg, initial encounter: Secondary | ICD-10-CM | POA: Insufficient documentation

## 2024-06-15 DIAGNOSIS — W010XXA Fall on same level from slipping, tripping and stumbling without subsequent striking against object, initial encounter: Secondary | ICD-10-CM | POA: Diagnosis not present

## 2024-06-15 DIAGNOSIS — R739 Hyperglycemia, unspecified: Secondary | ICD-10-CM | POA: Diagnosis not present

## 2024-06-15 DIAGNOSIS — D72829 Elevated white blood cell count, unspecified: Secondary | ICD-10-CM | POA: Insufficient documentation

## 2024-06-15 DIAGNOSIS — W19XXXA Unspecified fall, initial encounter: Secondary | ICD-10-CM

## 2024-06-15 LAB — CBC WITH DIFFERENTIAL/PLATELET
Abs Immature Granulocytes: 0.07 10*3/uL (ref 0.00–0.07)
Basophils Absolute: 0 10*3/uL (ref 0.0–0.1)
Basophils Relative: 0 %
Eosinophils Absolute: 0.1 10*3/uL (ref 0.0–0.5)
Eosinophils Relative: 1 %
HCT: 43.3 % (ref 36.0–46.0)
Hemoglobin: 14.4 g/dL (ref 12.0–15.0)
Immature Granulocytes: 1 %
Lymphocytes Relative: 10 %
Lymphs Abs: 1.3 10*3/uL (ref 0.7–4.0)
MCH: 30.3 pg (ref 26.0–34.0)
MCHC: 33.3 g/dL (ref 30.0–36.0)
MCV: 91 fL (ref 80.0–100.0)
Monocytes Absolute: 0.7 10*3/uL (ref 0.1–1.0)
Monocytes Relative: 6 %
Neutro Abs: 10.3 10*3/uL — ABNORMAL HIGH (ref 1.7–7.7)
Neutrophils Relative %: 82 %
Platelets: 248 10*3/uL (ref 150–400)
RBC: 4.76 MIL/uL (ref 3.87–5.11)
RDW: 12.6 % (ref 11.5–15.5)
WBC: 12.5 10*3/uL — ABNORMAL HIGH (ref 4.0–10.5)
nRBC: 0 % (ref 0.0–0.2)

## 2024-06-15 LAB — BASIC METABOLIC PANEL WITH GFR
Anion gap: 15 (ref 5–15)
BUN: 14 mg/dL (ref 6–20)
CO2: 18 mmol/L — ABNORMAL LOW (ref 22–32)
Calcium: 9.4 mg/dL (ref 8.9–10.3)
Chloride: 105 mmol/L (ref 98–111)
Creatinine, Ser: 1.08 mg/dL — ABNORMAL HIGH (ref 0.44–1.00)
GFR, Estimated: >60 mL/min (ref >60)
Glucose, Bld: 118 mg/dL — ABNORMAL HIGH (ref 70–99)
Potassium: 3.4 mmol/L — ABNORMAL LOW (ref 3.5–5.1)
Sodium: 138 mmol/L (ref 135–145)

## 2024-06-15 MED ORDER — OXYCODONE-ACETAMINOPHEN 5-325 MG PO TABS: 1.0000 | ORAL_TABLET | Freq: Four times a day (QID) | ORAL | 0 refills | Status: DC | PRN

## 2024-06-15 MED ORDER — HYDROMORPHONE HCL 1 MG/ML IJ SOLN
1.0000 mg | Freq: Once | INTRAMUSCULAR | Status: AC
  Administered 2024-06-15: 1 mg via INTRAVENOUS
  Filled 2024-06-15: qty 1

## 2024-06-15 NOTE — ED Triage Notes (Signed)
 Pt was in a pool with her kids playing and she slipped and fell. Pt states when she went down she twisted weird and hurt her left lower extremity. Pt then fell again.

## 2024-06-15 NOTE — ED Notes (Signed)
 Ortho tech at bedside

## 2024-06-15 NOTE — ED Provider Notes (Signed)
  EMERGENCY DEPARTMENT AT [facility] Provider Note   CSN: [id number] Arrival date & time: 06/15/24  1936     Patient presents with: Tiffany [name] is a 53 y.o. female.  She is here with severe left leg pain after falling twice.  She said she slipped in the pool and her leg bent in an abnormal direction of the knee.  She fell 1 more time attempting to ambulate.  No injuries to head neck or back.  No loss of consciousness.  Unable to ambulate due to pain.  {Add pertinent medical, surgical, social history, OB history to YEP:67052} The history is provided by the patient.  Fall This is a new problem. The current episode started 1 to 2 hours ago. The problem has not changed since onset.Pertinent negatives include no chest pain, no abdominal pain, no headaches and no shortness of breath. The symptoms are aggravated by bending, twisting and walking. Nothing relieves the symptoms. She has tried rest for the symptoms. The treatment provided no relief.  Knee Pain Location:  Knee Time since incident:  2 hours Injury: yes   Mechanism of injury: fall   Fall:    Fall occurred:  Standing Knee location:  L knee Pain details:    Quality:  Throbbing   Radiates to:  L leg   Severity:  Severe   Onset quality:  Sudden   Timing:  Constant   Progression:  Unchanged Chronicity:  New Associated symptoms: no back pain and no neck pain        Prior to Admission medications   Medication Sig Start Date End Date Taking? Authorizing Provider  acetaminophen  (TYLENOL ) 500 MG tablet Take 2 tablets (1,000 mg total) by mouth every 8 (eight) hours as needed. 02/05/24   Augustus Almarie RAMAN, PA-C  ALPRAZolam  (XANAX ) 0.5 MG tablet Take 1 tablet (0.5 mg total) by mouth daily as needed for anxiety. Patient taking differently: Take 0.5 mg by mouth See admin instructions. Take 0.5 mg by mouth at bedtime and an additional 0.5 mg once a day as needed for panic or anxiety 10/03/16   Isadora Krabbe D, PA  diltiazem  (CARDIZEM  CD) 120 MG 24 hr capsule Take 120 mg by mouth daily.    [provider]  DULoxetine  (CYMBALTA ) 60 MG capsule Take 1 capsule (60 mg total) by mouth daily. Patient taking differently: Take 60 mg by mouth at bedtime. 10/03/16   Isadora Krabbe D, PA  gabapentin  (NEURONTIN ) 300 MG capsule Take 1 capsule (300 mg total) by mouth 3 (three) times daily as needed for up to 30 doses. 05/13/24   Cottie Donnice PARAS, MD  losartan  (COZAAR ) 25 MG tablet Take 25 mg by mouth daily.    [provider]  oxyCODONE  (OXY IR/ROXICODONE ) 5 MG immediate release tablet Take 1 tablet (5 mg total) by mouth every 6 (six) hours as needed for severe pain (pain score 7-10) (not releived by tylenol ). Patient not taking: Reported on 02/19/2024 02/05/24   Augustus Almarie RAMAN, PA-C  senna-docusate (SENOKOT-S) 8.6-50 MG tablet Take 1 tablet by mouth 2 (two) times daily. 02/05/24   Simaan, Elizabeth S, PA-C    Allergies: Contrast media [iodinated contrast media], Stadol [butorphanol], and Beta adrenergic blockers    Review of Systems  Respiratory:  Negative for shortness of breath.   Cardiovascular:  Negative for chest pain.  Gastrointestinal:  Negative for abdominal pain.  Musculoskeletal:  Negative for back pain and neck pain.  Neurological:  Negative  for headaches.    Updated Vital Signs BP (!) 162/65 (BP Location: Right Arm)   Pulse 99   Temp 99 F (37.2 C)   Resp 18   SpO2 100%   Physical Exam Vitals and nursing note reviewed.  Constitutional:      General: She is not in acute distress.    Appearance: Normal appearance. She is well-developed.  HENT:     Head: Normocephalic and atraumatic.   Eyes:     Conjunctiva/sclera: Conjunctivae normal.    Cardiovascular:     Rate and Rhythm: Regular rhythm. Tachycardia present.     Heart sounds: No murmur heard. Pulmonary:     Effort: Pulmonary effort is normal. No respiratory distress.     Breath sounds: Normal  breath sounds. No stridor. No wheezing.  Abdominal:     Palpations: Abdomen is soft.     Tenderness: There is no abdominal tenderness. There is no guarding or rebound.   Musculoskeletal:        General: Tenderness and signs of injury present. Normal range of motion.     Cervical back: Neck supple.     Comments: Bilateral upper extremities and right lower extremity full range of motion without any pain or limitations.  Nontender left ankle and foot.  Severe tenderness diffusely around left knee upper thigh into left hip.  No shortening or rotation.  Distal motor or sensory and pulses intact.   Skin:    General: Skin is warm and dry.   Neurological:     General: No focal deficit present.     Mental Status: She is alert.     GCS: GCS eye subscore is 4. GCS verbal subscore is 5. GCS motor subscore is 6.     Sensory: No sensory deficit.     Motor: No weakness.     (all labs ordered are listed, but only abnormal results are displayed) Labs Reviewed  BASIC METABOLIC PANEL WITH GFR  CBC WITH DIFFERENTIAL/PLATELET    EKG: None  Radiology: No results found.  {Document cardiac monitor, telemetry assessment procedure when appropriate:32947} Procedures   Medications Ordered in the ED  HYDROmorphone  (DILAUDID ) injection 1 mg (has no administration in time range)      {Click here for ABCD2, HEART and other calculators REFRESH Note before signing:1}                              Medical Decision Making Amount and/or Complexity of Data Reviewed Labs: ordered. Radiology: ordered.  Risk Prescription drug management.   This patient complains of ***; this involves an extensive number of treatment Options and is a complaint that carries with it a high risk of complications and morbidity. The differential includes ***  I ordered, reviewed and interpreted labs, which included *** I ordered medication *** and reviewed PMP when indicated. I ordered imaging studies which included ***  and I independently    visualized and interpreted imaging which showed *** Additional history obtained from *** Previous records obtained and reviewed *** I consulted *** and discussed lab and imaging findings and discussed disposition.  Cardiac monitoring reviewed, *** Social determinants considered, *** Critical Interventions: ***  After the interventions stated above, I reevaluated the patient and found *** Admission and further testing considered, ***   {Document critical care time when appropriate  Document review of labs and clinical decision tools ie CHADS2VASC2, etc  Document your independent review of radiology images and any  outside records  Document your discussion with family members, caretakers and with consultants  Document social determinants of health affecting pt's care  Document your decision making why or why not admission, treatments were needed:32947:::1}   Final diagnoses:  None    ED Discharge Orders     None

## 2024-06-15 NOTE — ED Notes (Signed)
Pt discharged. Pt given discharge papers and papers explained. Pt in NAD at this time

## 2024-06-15 NOTE — ED Notes (Signed)
 Ortho tech called and will be down to apply knee immoblizer

## 2024-06-15 NOTE — Progress Notes (Signed)
 Orthopedic Tech Progress Note Patient Details:  ARLAYNE LIGGINS [birth year] [id number]  Ortho Devices Type of Ortho Device: Knee Immobilizer, Crutches Ortho Device/Splint Location: lle Ortho Device/Splint Interventions: Ordered, Application, Adjustment   Post Interventions Patient Tolerated: Well Instructions Provided: Care of device, Adjustment of device  Chandra Dorn PARAS 06/15/2024, 10:55 PM

## 2024-06-15 NOTE — Discharge Instructions (Addendum)
 You were seen in the emergency department for left knee injury.  You had x-rays of your knee and hip and a CAT scan of your knee that did not show any obvious fracture or dislocation.  Please use ice to the affected area and Tylenol  and ibuprofen  for pain.  We are sending a prescription for a short course of pain medicine to your pharmacy for worsening pain.  Follow-up with Dr. Ernie orthopedics.  Knee immobilizer for safety.  Crutches.  Return if any worsening or concerning symptoms

## 2024-06-15 NOTE — ED Notes (Signed)
 Patient transported to X-ray

## 2024-06-17 ENCOUNTER — Encounter: Payer: Self-pay | Admitting: Neurology

## 2024-06-20 ENCOUNTER — Telehealth: Payer: Self-pay | Admitting: *Deleted

## 2024-06-20 ENCOUNTER — Telehealth: Payer: Self-pay | Admitting: Cardiology

## 2024-06-20 NOTE — Telephone Encounter (Signed)
 Pt has been scheduled tele preop appt 06/26/24. Med rec and consent are done.     Patient Consent for Virtual Visit        Tiffany [name] has provided verbal consent on 06/20/2024 for a virtual visit (video or telephone).   CONSENT FOR VIRTUAL VISIT FOR:  Tiffany [name] Bring  By participating in this virtual visit I agree to the following:  I hereby voluntarily request, consent and authorize [facility] and its employed or contracted physicians, physician assistants, nurse practitioners or other licensed health care professionals (the Practitioner), to provide me with telemedicine health care services (the "Services) as deemed necessary by the treating Practitioner. I acknowledge and consent to receive the Services by the Practitioner via telemedicine. I understand that the telemedicine visit will involve communicating with the Practitioner through live audiovisual communication technology and the disclosure of certain medical information by electronic transmission. I acknowledge that I have been given the opportunity to request an in-person assessment or other available alternative prior to the telemedicine visit and am voluntarily participating in the telemedicine visit.  I understand that I have the right to withhold or withdraw my consent to the use of telemedicine in the course of my care at any time, without affecting my right to future care or treatment, and that the Practitioner or I may terminate the telemedicine visit at any time. I understand that I have the right to inspect all information obtained and/or recorded in the course of the telemedicine visit and may receive copies of available information for a reasonable fee.  I understand that some of the potential risks of receiving the Services via telemedicine include:  Delay or interruption in medical evaluation due to technological equipment failure or disruption; Information transmitted may not be sufficient (e.g. poor resolution of  images) to allow for appropriate medical decision making by the Practitioner; and/or  In rare instances, security protocols could fail, causing a breach of personal health information.  Furthermore, I acknowledge that it is my responsibility to provide information about my medical history, conditions and care that is complete and accurate to the best of my ability. I acknowledge that Practitioner's advice, recommendations, and/or decision may be based on factors not within their control, such as incomplete or inaccurate data provided by me or distortions of diagnostic images or specimens that may result from electronic transmissions. I understand that the practice of medicine is not an exact science and that Practitioner makes no warranties or guarantees regarding treatment outcomes. I acknowledge that a copy of this consent can be made available to me via my patient portal [facility] MyChart), or I can request a printed copy by calling the office of [facility].    I understand that my insurance will be billed for this visit.   I have read or had this consent read to me. I understand the contents of this consent, which adequately explains the benefits and risks of the Services being provided via telemedicine.  I have been provided ample opportunity to ask questions regarding this consent and the Services and have had my questions answered to my satisfaction. I give my informed consent for the services to be provided through the use of telemedicine in my medical care

## 2024-06-20 NOTE — Telephone Encounter (Signed)
 Pt has been scheduled tele preop appt 06/26/24. Med rec and consent are done.

## 2024-06-20 NOTE — Telephone Encounter (Signed)
   Name: Tiffany [name]  DOB: 07/06/1971  MRN: [id number]  Primary Cardiologist: Soyla DELENA Merck, MD   Preoperative team, please contact this patient and set up a phone call appointment for further preoperative risk assessment. Please obtain consent and complete medication review. Last seen by Katlyn West, NP on 02/19/2024. Thank you for your help.  I confirm that guidance regarding antiplatelet and oral anticoagulation therapy has been completed and, if necessary, noted below. Not on anticoagulation or antiplatelet therapy.  I also confirmed the patient resides in the state of [place] . As per [facility] [employer] telemedicine laws, the patient must reside in the state in which the provider is licensed.   Lamarr Satterfield, NP 06/20/2024, 3:01 PM [facility]

## 2024-06-20 NOTE — Telephone Encounter (Signed)
   [facility] Pre-operative Risk Assessment    Request for surgical clearance:  What type of surgery is being performed?  Left Knee Scope w/ ACL Reconstruction   When is this surgery scheduled?  TBD   What type of clearance is required (medical clearance vs. Pharmacy clearance to hold med vs. Both)?  Both   Are there any medications that need to be held prior to surgery and how long? Please advise on medications.  Practice name and name of physician performing surgery?  [facility]  Dr. Norleen Gavel   What is your office phone number? [phone]    7.   What is your office fax number? [phone]  8.   Anesthesia type (None, local, MAC, general)? General    Tiffany [name] 06/20/2024, 12:05 PM

## 2024-06-26 ENCOUNTER — Ambulatory Visit: Attending: Cardiology | Admitting: Emergency Medicine

## 2024-06-26 DIAGNOSIS — Z01810 Encounter for preprocedural cardiovascular examination: Secondary | ICD-10-CM

## 2024-06-26 NOTE — Progress Notes (Signed)
 Virtual Visit via Telephone Note   Because of Tiffany [name] co-morbid illnesses, she is at least at moderate risk for complications without adequate follow up.  This format is felt to be most appropriate for this patient at this time.  Due to technical limitations with video connection (technology), today's appointment will be conducted as an audio only telehealth visit, and Tiffany [name] verbally agreed to proceed in this manner.   All issues noted in this document were discussed and addressed.  No physical exam could be performed with this format.  Evaluation Performed:  Preoperative cardiovascular risk assessment _____________   Date:  06/26/2024   Patient ID:  Tiffany [name], DOB 05-Oct-1971, MRN [id number] Patient Location:  Home Provider location:   Office  Primary Care Provider:  Windle Farrel DEL, FNP Primary Cardiologist:  Soyla DELENA Merck, MD  Chief Complaint / Patient Profile   53 y.o. y/o female with a h/o SVT, anxiety, hypertension who is pending left knee scope with ACL reconstruction on date TBD with [facility] and presents today for telephonic preoperative cardiovascular risk assessment.  History of Present Illness    Tiffany [name] is a 53 y.o. female who presents via audio/video conferencing for a telehealth visit today.  Pt was last seen in cardiology clinic on 02/19/2024 by Reche Barters, NP.  At that time Tiffany [name] was doing well.  The patient is now pending procedure as outlined above. Since her last visit, she denies chest pain, shortness of breath, lower extremity edema, fatigue, melena, hematuria, hemoptysis, diaphoresis, weakness, presyncope, syncope, orthopnea, and PND.  Today the patient is doing well overall.  She is without acute cardiovascular concerns or complaints at this time.  She denies any exertional symptoms.  She reports that her palpitations/SVT/PVCs are well-controlled.  She is only taking her Cardizem  as needed which at this point she rarely needs  it.  She maintains an active lifestyle without limitation.  She does yard work without exertional symptoms.  Overall she is able to complete greater than 4 METS.  Past Medical History    Past Medical History:  Diagnosis Date   Allergy    Anginal pain (HCC)    started Nov 2024, cardiologist   Anxiety    Arthritis    Complication of anesthesia    CRPS (complex regional pain syndrome type I)    Depression    DES exposure in utero    Dyspnea    PONV (postoperative nausea and vomiting)    SVT (supraventricular tachycardia) (HCC)    Past Surgical History:  Procedure Laterality Date   APPENDECTOMY     CESAREAN SECTION     x2   CHOLECYSTECTOMY     LAPAROSCOPIC APPENDECTOMY N/A 12/08/2021   Procedure: APPENDECTOMY LAPAROSCOPIC;  Surgeon: Paola Dreama SAILOR, MD;  Location: [facility] OR;  Service: General;  Laterality: N/A;   LAPAROSCOPY N/A 02/04/2024   Procedure: LAPAROSCOPY DIAGNOSTIC;  Surgeon: Lyndel Deward PARAS, MD;  Location: [facility] ORS;  Service: General;  Laterality: N/A;   SALPINGOSTOMY     due to ectopic pregnancy    Allergies  Allergies  Allergen Reactions   Contrast Media [Iodinated Contrast Media] Shortness Of Breath    Just had a  ct with contrast w/o pre med and she did fine   Stadol [Butorphanol] Shortness Of Breath   Beta Adrenergic Blockers Other (See Comments)    Wheezing     Home Medications    Prior to Admission medications   Medication Sig  Start Date End Date Taking? Authorizing Provider  acetaminophen  (TYLENOL ) 500 MG tablet Take 2 tablets (1,000 mg total) by mouth every 8 (eight) hours as needed. 02/05/24   Augustus Almarie RAMAN, PA-C  ALPRAZolam  (XANAX ) 0.5 MG tablet Take 1 tablet (0.5 mg total) by mouth daily as needed for anxiety. Patient taking differently: Take 0.5 mg by mouth See admin instructions. Take 0.5 mg by mouth at bedtime and an additional 0.5 mg once a day as needed for panic or anxiety 10/03/16   Isadora Krabbe D, PA  diltiazem  (CARDIZEM  CD) 120  MG 24 hr capsule Take 120 mg by mouth daily.    [provider]  DULoxetine  (CYMBALTA ) 60 MG capsule Take 1 capsule (60 mg total) by mouth daily. Patient taking differently: Take 60 mg by mouth at bedtime. 10/03/16   Isadora Krabbe D, PA  gabapentin  (NEURONTIN ) 300 MG capsule Take 1 capsule (300 mg total) by mouth 3 (three) times daily as needed for up to 30 doses. 05/13/24   Cottie Donnice PARAS, MD  losartan  (COZAAR ) 25 MG tablet Take 25 mg by mouth daily.    [provider]  oxyCODONE  (OXY IR/ROXICODONE ) 5 MG immediate release tablet Take 1 tablet (5 mg total) by mouth every 6 (six) hours as needed for severe pain (pain score 7-10) (not releived by tylenol ). Patient not taking: Reported on 06/20/2024 02/05/24   Simaan, Elizabeth S, PA-C  oxyCODONE -acetaminophen  (PERCOCET/ROXICET) 5-325 MG tablet Take 1 tablet by mouth every 6 (six) hours as needed for severe pain (pain score 7-10). 06/15/24   Towana Ozell BROCKS, MD  senna-docusate (SENOKOT-S) 8.6-50 MG tablet Take 1 tablet by mouth 2 (two) times daily. 02/05/24   Augustus Almarie RAMAN, PA-C    Physical Exam    Vital Signs:  Tiffany [name] does not have vital signs available for review today  Given telephonic nature of communication, physical exam is limited. AAOx3. NAD. Normal affect.  Speech and respirations are unlabored.  Accessory Clinical Findings    None  Assessment & Plan    1.  Preoperative Cardiovascular Risk Assessment: According to the Revised Cardiac Risk Index (RCRI), her Perioperative Risk of Major Cardiac Event is (%): 0.4. Her Functional Capacity in METs is: 6.27 according to the [employer] Activity Status Index (DASI). Therefore, based on ACC/AHA guidelines, patient would be at acceptable risk for the planned procedure without further cardiovascular testing. I will route this recommendation to the requesting party via Epic fax function.  The patient was advised that if she develops new symptoms prior to surgery to  contact our office to arrange for a follow-up visit, and she verbalized understanding.   A copy of this note will be routed to requesting surgeon.  Time:   Today, I have spent 8 minutes with the patient with telehealth technology discussing medical history, symptoms, and management plan.     Lum LITTIE Louis, NP  06/26/2024, 2:32 PM

## 2024-08-14 NOTE — Progress Notes (Signed)
 Initial neurology clinic note  Tiffany [name] MRN: [id number] DOB: [birth year]  Referring provider: Llewellyn [name] LABOR, DO  Primary care provider: Windle [name] DEL, FNP  Reason for consult:  multiple complaints  Subjective:  This is [name], a 53 y.o. right-handed female with a medical history of supraventricular tachycardia, complex regional pain syndrome of right arm who presents to neurology clinic with multiple complaints. The patient is accompanied by husband.  In 12/2018 patient started having electric shocks going through her spine causing rigidity and spasms in her back. She was awake and alert. Symptoms would last a couple of minutes. She would also have issues with getting words out and slow speech or dysarthria. There was original concern for seizures by patient. She saw a doctor in [place] (not sure what speciality) and per patient there was concern for MS. She had MRI brain that was normal. She had EEG in 2021 and 2023 without any EEG changes. She did not have all the symptoms at the time of EEG per patient. She has a lot of other symptoms that have progressed especially since 2023 and more lately.  Patient has a list of symptoms that she currently experiences: Episodes of getting words out and slow speech or dysarthria - occurs when she gets tightening and tenseness of neck/back Electric shocks going through her spine causing rigidity and spasms in her back - occurs weekly to 2 weeks that lasts a couple of minutes - usually first thing in the morning Trouble swallowing - liquid and solids; coughing while eating; at least once per week; never had any work up including swallow study Visual changes - double vision or vision moving like a boat rocking; occurs weekly. Has seen an eye doctor who did not think it was related to eye Tingling and numbness in both arms and legs. Difficulty distinguishing hot and cold. Can get burns on hands because not knowing something is  hot. Does not feel cold, like outside temp or water. Feels cold and will put blanket on and then will overheat. Heat intolerance. Muscle weakness - diffuse. Can walk but falls (5-10 this year). Last fall caused left ACL tear for which she needs surgery. Cannot hold things and has fine motor skill problems Extreme fatigue. If she does a lot of activity will be in bed recovering for days. Diffuse pain - does not see pain management CRPS in right arm from crush injury - takes Cymbalta , which helps with pain Tight band sensation around chest, like she has on a tight bra Has shock pains in right neck and face with hearing that comes and goes in the right ear. Will last a day to many days.  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 Per patient she has been dismissed since. She does see psychiatry for anxiety and has discussed FND with them.  She is not on any supplements.  Smoker: Vapes OCP/hormone use: None  Caffiene use: Soda daily EtOH use: None Restrictive diet: No Family history of neurologic disease including headaches: niece with chiari malformation that needed shunt  She was a former Engineer, civil (consulting).   Patient thinks she has a chiari malformation.  MEDICATIONS:  Outpatient Encounter Medications as of 08/22/2024  Medication Sig   acetaminophen  (TYLENOL ) 500 MG tablet Take 2 tablets (1,000 mg total) by mouth every 8 (eight) hours as needed.   ALPRAZolam  (XANAX ) 0.5 MG tablet Take  1 tablet (0.5 mg total) by mouth daily as needed for anxiety.   diltiazem  (CARDIZEM  CD) 120 MG 24 hr capsule Take 120 mg by mouth daily.   DULoxetine  (CYMBALTA ) 60 MG capsule Take 1 capsule (60 mg total) by mouth daily.   [DISCONTINUED] gabapentin  (NEURONTIN ) 300 MG capsule Take 1 capsule (300 mg total) by mouth 3 (three) times daily as needed for up to 30 doses. (Patient not taking: Reported on 08/22/2024)   [DISCONTINUED] losartan  (COZAAR )  25 MG tablet Take 25 mg by mouth daily. (Patient not taking: Reported on 08/22/2024)   [DISCONTINUED] oxyCODONE  (OXY IR/ROXICODONE ) 5 MG immediate release tablet Take 1 tablet (5 mg total) by mouth every 6 (six) hours as needed for severe pain (pain score 7-10) (not releived by tylenol ). (Patient not taking: Reported on 06/20/2024)   [DISCONTINUED] oxyCODONE -acetaminophen  (PERCOCET/ROXICET) 5-325 MG tablet Take 1 tablet by mouth every 6 (six) hours as needed for severe pain (pain score 7-10).   [DISCONTINUED] senna-docusate (SENOKOT-S) 8.6-50 MG tablet Take 1 tablet by mouth 2 (two) times daily.   No facility-administered encounter medications on file as of 08/22/2024.    PAST MEDICAL HISTORY: Past Medical History:  Diagnosis Date   Allergy    Anginal pain (HCC)    started Nov 2024, cardiologist   Anxiety    Arthritis    Complication of anesthesia    CRPS (complex regional pain syndrome type I)    Depression    DES exposure in utero    Dyspnea    PONV (postoperative nausea and vomiting)    SVT (supraventricular tachycardia) (HCC)     PAST SURGICAL HISTORY: Past Surgical History:  Procedure Laterality Date   APPENDECTOMY     CESAREAN SECTION     x2   CHOLECYSTECTOMY     LAPAROSCOPIC APPENDECTOMY N/A 12/08/2021   Procedure: APPENDECTOMY LAPAROSCOPIC;  Surgeon: Tiffany Dreama SAILOR, MD;  Location: [facility] OR;  Service: General;  Laterality: N/A;   LAPAROSCOPY N/A 02/04/2024   Procedure: LAPAROSCOPY DIAGNOSTIC;  Surgeon: Tiffany Deward PARAS, MD;  Location: [facility] ORS;  Service: General;  Laterality: N/A;   SALPINGOSTOMY     due to ectopic pregnancy    ALLERGIES: Allergies  Allergen Reactions   Contrast Media [Iodinated Contrast Media] Shortness Of Breath    Just had a  ct with contrast w/o pre med and she did fine   Stadol [Butorphanol] Shortness Of Breath   Beta Adrenergic Blockers Other (See Comments)    Wheezing     FAMILY HISTORY: Family History  Problem Relation Age of Onset    Heart disease Mother    Hyperlipidemia Mother    Hypertension Mother    Mental illness Mother    COPD Mother    Mental illness Father        bipolar   Hyperlipidemia Sister    Hypertension Sister    Supraventricular tachycardia Sister    Supraventricular tachycardia Sister    Hypertension Brother    Cancer Maternal Grandmother    Heart disease Maternal Grandmother    Hyperlipidemia Maternal Grandmother    Hypertension Maternal Grandmother    Stroke Maternal Grandmother    Cancer Daughter    Arthritis Daughter        JRA   Arthritis Daughter        JRA    SOCIAL HISTORY: Social History   Tobacco Use   Smoking status: Former    Current packs/day: 0.50    Average packs/day: 0.5 packs/day for 15.0 years (7.5 ttl  pk-yrs)    Types: Cigarettes   Smokeless tobacco: Never   Tobacco comments:    quit in May 2019  Vaping Use   Vaping status: Every Day   Substances: Nicotine, Flavoring  Substance Use Topics   Alcohol use: Not Currently    Comment: rarely   Drug use: No    Types: MDMA Chiropodist)   Social History   Social History Narrative   Lives with her husband, and their 3 children.      Are you right handed or left handed? Right Handed    Are you currently employed ? 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 No    Who lives with you? Husband    What type of home do you live in: 1 story or 2 story? Two story home        Objective:  Vital Signs:  BP 128/84   Pulse (!) 102   Ht 5' 3 (1.6 m)   Wt 254 lb (115.2 kg)   SpO2 96%   BMI 44.99 kg/m   General: General appearance: Awake and alert. No distress. Cooperative with exam.  Skin: No obvious rash or jaundice. HEENT: Atraumatic. Anicteric. Lungs: Non-labored breathing on room air  Heart: Regular Extremities: No edema. Psych: Affect appropriate.  Neurological: Mental Status: Alert. Speech fluent. No pseudobulbar affect Cranial Nerves: CNII: No RAPD. Visual  fields intact. CNIII, IV, VI: PERRL. No nystagmus. EOMI. CN V: Facial sensation diminished in right face compared to left. CN VII: Facial muscles symmetric and strong. No ptosis at rest. CN VIII: Hears finger rub well bilaterally. CN IX: No hypophonia. CN X: Palate elevates symmetrically. CN XI: Full strength shoulder shrug bilaterally. CN XII: Tongue protrusion full and midline. No atrophy or fasciculations. No significant dysarthria Motor: Tone is normal. Strength is 5/5 in bilateral upper and lower extremities Reflexes:  Right Left   Bicep 2+ 2+   Tricep 2+ 2+   BrRad 2+ 2+   Knee 2+ 2+   Ankle 2+ 2+    Pathological Reflexes: Hoffman: absent bilaterally Troemner: absent bilaterally Sensation: Pinprick: Diminished in right face, both arms, both legs above knee (normal below the knee bilaterally) Vibration: Diminished on right compared to left; midline splitting of forehead Coordination: Intact finger-to- nose-finger bilaterally. Romberg negative. Gait: Able to rise from chair with arms crossed unassisted. Antalgic gait.   Labs and Imaging review: Internal labs: 06/15/24: CBC w/ diff significant for WBC 12.5 (neutrophilic) BMP significant for K 3.4, glucose 118, Cr 1.08  02/04/24: Folate wnl Mg 2.5 B12: 282 Vit D: 19.60  Lipid panel (02/03/24): tChol 169, LDL 108, TG 89 CMP (02/03/24) significant for Alk phos 154 (chronic)  ESR (11/16/23): 11  Imaging/Procedures: MRI brain w/wo contrast (02/04/24): FINDINGS: Brain: No restricted diffusion to suggest acute or subacute infarct. No abnormal parenchymal or meningeal enhancement. Redemonstrated scattered tiny foci of T2 hyperintense signal in the supratentorial white matter; these are not particularly radially oriented to the ventricles, nor are there lesions involving the corpus callosum. These appear unchanged from 10/04/2022.   No acute hemorrhage, mass, mass effect, or midline shift. No hydrocephalus or extra-axial  collection. Pituitary within normal limits. Low lying cerebellar tonsils, which extend 4 mm below the foramen magnum and do not meet criteria for Chiari I malformation.   No hemosiderin deposition to suggest remote hemorrhage. Normal cerebral volume for age.   Vascular: Normal arterial flow voids. Normal arterial and venous enhancement.  Skull and upper cervical spine: Normal marrow signal.   Sinuses/Orbits: Mild mucosal thickening in the maxillary sinuses. No acute finding in the orbits.   Other: The mastoid air cells are well aerated.   IMPRESSION: 1. No acute intracranial process. No evidence of acute or subacute infarct. 2. Redemonstrated scattered tiny foci of T2 hyperintense signal in the supratentorial white matter, which are unchanged from 10/04/2022 and are not characteristic of demyelinating disease. No abnormal enhancement.  EEG (10/04/22): Patient history: 53 yo RH F with h/o anxiety, arthritis, complex regional pain syndrome, depression, SVT, presenting with seizure-like activity (described as whole body shaking all at once without evolution and with retained awareness) and inability to speak (mute though intact comprehension and able to express herself through typing and motions). EEG to evaluate for seizure Level of alertness: Awake AEDs during EEG study: None Technical aspects: This EEG study was done with scalp electrodes positioned according to the 10-20 International system of electrode placement. Electrical activity was reviewed with band pass filter of 1-70Hz , sensitivity of 7 uV/mm, display speed of 70mm/sec with a 60Hz  notched filter applied as appropriate. EEG data were recorded continuously and digitally stored.  Video monitoring was available and reviewed as appropriate. Description: The posterior dominant rhythm consists of 9-10 Hz activity of moderate voltage (25-35 uV) seen predominantly in posterior head regions, symmetric and reactive to eye opening and  eye closing.    Event button was pressed on 10/04/2022 at 1424. During hyperventilation, patient started breathing heavily, unable to speak initially ans then stuttering. Concomitant EEG before, during and after the event showed normal posterior dominant rhythm, d   Physiologic photic driving was seen during photic stimulation.     IMPRESSION: This study is within normal limits. No seizures or epileptiform discharges were seen throughout the recording.   One event was recorded as describe above without concomitant eeg change and was NOT an epileptic event.  EEG (11/22/20): Patient history: 53 yo F with episode of inability to move, feel rigid, unable to talk. EEG to evaluate for seizure.   Level of alertness: Awake   AEDs during EEG study: None   Technical aspects: This EEG study was done with scalp electrodes positioned according to the 10-20 International system of electrode placement. Electrical activity was acquired at a sampling rate of 500Hz  and reviewed with a high frequency filter of 70Hz  and a low frequency filter of 1Hz . EEG data were recorded continuously and digitally stored.    Description: The posterior dominant rhythm consists of 10 Hz activity of moderate voltage (25-35 uV) seen predominantly in posterior head regions, symmetric and reactive to eye opening and eye closing. Physiologic photic driving was seen during photic stimulation.  Hyperventilation was not performed.      IMPRESSION: This study is within normal limits. No seizures or epileptiform discharges were seen throughout the recording.  Assessment/Plan:  Tiffany [name] is a 53 y.o. female who presents for evaluation of multiple complaints including diffuse pain, paresthesias, weakness, difficulty swallowing. She has a relevant medical history of supraventricular tachycardia, complex regional pain syndrome of right arm. Her neurological examination is pertinent for diminished sensation in right face, bilateral arms,  and bilateral legs proximally to her knees but intact distally. Available diagnostic data is significant for low vit D, borderline low B12. Her MRI brain shows low lying cerebellar tonsils that do not meet criteria for Chiari malformation nor do they appear to be causing in compression or obstruction of CSF. The etiology of patient's symptoms  is currently unclear and difficult to localize. She was previously diagnosed with functional neurologic deficit, which is possible. It is also possible this is a pain syndrome such as myofascial pain disorder or fibromyalgia. I will attempt to get evidence of an objective structural deficit, which I am not convinced we currently have.  PLAN: -Blood work: B1, TSH -Swallow study (MBS) -MRI cervical spine w/wo contrast -B12 1000 mcg daily -Vit D 1000 international units daily -Continue Cymbalta  60 mg daily -May consider pain management referral  -Return to clinic to be determined  The impression above as well as the plan as outlined below were extensively discussed with the patient (in the company of husband) who voiced understanding. All questions were answered to their satisfaction.  The patient was counseled on pertinent fall precautions per the printed material provided today, and as noted under the Patient Instructions section below.  When available, results of the above investigations and possible further recommendations will be communicated to the patient via telephone/MyChart. Patient to call office if not contacted after expected testing turnaround time.   Total time spent reviewing records, interview, history/exam, documentation, and coordination of care on day of encounter:  70 min   Thank you for allowing me to participate in patient's care.  If I can answer any additional questions, I would be pleased to do so.  Venetia Potters, MD   CC: Tiffany [name] DEL, FNP [id number] [place] [id number]  CC: Referring provider: Llewellyn [name] LABOR, DO [address] [place],  [place] [address]

## 2024-08-22 ENCOUNTER — Other Ambulatory Visit

## 2024-08-22 ENCOUNTER — Ambulatory Visit (INDEPENDENT_AMBULATORY_CARE_PROVIDER_SITE_OTHER): Admitting: Neurology

## 2024-08-22 ENCOUNTER — Encounter: Payer: Self-pay | Admitting: Neurology

## 2024-08-22 ENCOUNTER — Telehealth: Payer: Self-pay

## 2024-08-22 DIAGNOSIS — R531 Weakness: Secondary | ICD-10-CM

## 2024-08-22 DIAGNOSIS — R52 Pain, unspecified: Secondary | ICD-10-CM

## 2024-08-22 DIAGNOSIS — R1310 Dysphagia, unspecified: Secondary | ICD-10-CM

## 2024-08-22 DIAGNOSIS — H539 Unspecified visual disturbance: Secondary | ICD-10-CM

## 2024-08-22 DIAGNOSIS — R209 Unspecified disturbances of skin sensation: Secondary | ICD-10-CM

## 2024-08-22 NOTE — Patient Instructions (Signed)
 I saw you today for a lot of symptoms including diffuse pain, weakness, problems swallowing, imbalance, etc.  I am not sure the cause of your problems. I would like to investigate further with the following: -Blood work today -MRI of your cervical spine -Swallow study  I will be in touch when I have your results to discuss next steps.  Your vitamin B12 and D are low. You should take the following: -B12 1000 mcg daily -Vitamin D  1000 international units daily.  These can be bought over the counter at any drug store or online.  Continue Cymbalta  60 mg daily  You may need a pain management doctor to handle your diffuse pain, but we will discuss this after testing.  Please let me know if you have any questions or concerns in the meantime.  The physicians and staff at [facility] are committed to providing excellent care. You may receive a survey requesting feedback about your experience at our office. We strive to receive very good responses to the survey questions. If you feel that your experience would prevent you from giving the office a very good  response, please contact our office to try to remedy the situation. We may be reached at [phone]. Thank you for taking the time out of your busy day to complete the survey.   Venetia Potters, MD [facility]  Preventing Falls at [facility] are common, often dreaded events in the lives of older people. Aside from the obvious injuries and even death that may result, fall can cause wide-ranging consequences including loss of independence, mental decline, decreased activity and mobility. Younger people are also at risk of falling, especially those with chronic illnesses and fatigue.  Ways to reduce risk for falling Examine diet and medications. Warm foods and alcohol dilate blood vessels, which can lead to dizziness when standing. Sleep aids, antidepressants and pain medications can also increase the likelihood of a fall.  Get  a vision exam. Poor vision, cataracts and glaucoma increase the chances of falling.  Check foot gear. Shoes should fit snugly and have a sturdy, nonskid sole and a broad, low heel  Participate in a physician-approved exercise program to build and maintain muscle strength and improve balance and coordination. Programs that use ankle weights or stretch bands are excellent for muscle-strengthening. Water aerobics programs and low-impact Tai Chi programs have also been shown to improve balance and coordination.  Increase vitamin D  intake. Vitamin D  improves muscle strength and increases the amount of calcium the body is able to absorb and deposit in bones.  How to prevent falls from common hazards Floors - Remove all loose wires, cords, and throw rugs. Minimize clutter. Make sure rugs are anchored and smooth. Keep furniture in its usual place.  Chairs -- Use chairs with straight backs, armrests and firm seats. Add firm cushions to existing pieces to add height.  Bathroom - Install grab bars and non-skid tape in the tub or shower. Use a bathtub transfer bench or a shower chair with a back support Use an elevated toilet seat and/or safety rails to assist standing from a low surface. Do not use towel racks or bathroom tissue holders to help you stand.  Lighting - Make sure halls, stairways, and entrances are well-lit. Install a night light in your bathroom or hallway. Make sure there is a light switch at the top and bottom of the staircase. Turn lights on if you get up in the middle of the night. Make sure lamps or  light switches are within reach of the bed if you have to get up during the night.  Kitchen - Install non-skid rubber mats near the sink and stove. Clean spills immediately. Store frequently used utensils, pots, pans between waist and eye level. This helps prevent reaching and bending. Sit when getting things out of lower cupboards.  Living room/ Bedrooms - Place furniture with wide spaces in  between, giving enough room to move around. Establish a route through the living room that gives you something to hold onto as you walk.  Stairs - Make sure treads, rails, and rugs are secure. Install a rail on both sides of the stairs. If stairs are a threat, it might be helpful to arrange most of your activities on the lower level to reduce the number of times you must climb the stairs.  Entrances and doorways - Install metal handles on the walls adjacent to the doorknobs of all doors to make it more secure as you travel through the doorway.  Tips for maintaining balance Keep at least one hand free at all times. Try using a backpack or fanny pack to hold things rather than carrying them in your hands. Never carry objects in both hands when walking as this interferes with keeping your balance.  Attempt to swing both arms from front to back while walking. This might require a conscious effort if Parkinson's disease has diminished your movement. It will, however, help you to maintain balance and posture, and reduce fatigue.  Consciously lift your feet off of the ground when walking. Shuffling and dragging of the feet is a common culprit in losing your balance.  When trying to navigate turns, use a U technique of facing forward and making a wide turn, rather than pivoting sharply.  Try to stand with your feet shoulder-length apart. When your feet are close together for any length of time, you increase your risk of losing your balance and falling.  Do one thing at a time. Don't try to walk and accomplish another task, such as reading or looking around. The decrease in your automatic reflexes complicates motor function, so the less distraction, the better.  Do not wear rubber or gripping soled shoes, they might catch on the floor and cause tripping.  Move slowly when changing positions. Use deliberate, concentrated movements and, if needed, use a grab bar or walking aid. Count 15 seconds between  each movement. For example, when rising from a seated position, wait 15 seconds after standing to begin walking.  If balance is a continuous problem, you might want to consider a walking aid such as a cane, walking stick, or walker. Once you've mastered walking with help, you might be ready to try it on your own again.

## 2024-08-22 NOTE — Telephone Encounter (Signed)
 Insurance check and pt does not need PA . Order Request in Media

## 2024-08-26 ENCOUNTER — Encounter: Payer: Self-pay | Admitting: Neurology

## 2024-08-26 ENCOUNTER — Other Ambulatory Visit (HOSPITAL_COMMUNITY): Payer: Self-pay | Admitting: *Deleted

## 2024-08-26 DIAGNOSIS — R1310 Dysphagia, unspecified: Secondary | ICD-10-CM

## 2024-08-27 ENCOUNTER — Ambulatory Visit: Payer: Self-pay | Admitting: Neurology

## 2024-08-27 LAB — TSH: TSH: 3.02 m[IU]/L

## 2024-08-27 LAB — VITAMIN B1: Vitamin B1 (Thiamine): 12 nmol/L (ref 8–30)

## 2024-09-04 ENCOUNTER — Encounter: Payer: Self-pay | Admitting: Neurology

## 2024-09-18 ENCOUNTER — Other Ambulatory Visit

## 2024-10-01 ENCOUNTER — Ambulatory Visit (HOSPITAL_COMMUNITY)

## 2024-10-01 ENCOUNTER — Ambulatory Visit (HOSPITAL_COMMUNITY)
Admission: RE | Admit: 2024-10-01 | Discharge: 2024-10-01 | Disposition: A | Discharge status: Home / Self Care | Source: Ambulatory Visit | Attending: Family | Admitting: Family

## 2024-10-01 DIAGNOSIS — R1310 Dysphagia, unspecified: Secondary | ICD-10-CM

## 2024-10-12 ENCOUNTER — Other Ambulatory Visit

## 2024-10-22 ENCOUNTER — Encounter (HOSPITAL_COMMUNITY)

## 2024-10-26 ENCOUNTER — Ambulatory Visit
Admission: RE | Admit: 2024-10-26 | Discharge: 2024-10-26 | Disposition: A | Discharge status: Home / Self Care | Source: Ambulatory Visit | Attending: Neurology | Admitting: Neurology

## 2024-10-26 DIAGNOSIS — R531 Weakness: Secondary | ICD-10-CM

## 2024-10-26 DIAGNOSIS — H539 Unspecified visual disturbance: Secondary | ICD-10-CM

## 2024-10-26 DIAGNOSIS — R1310 Dysphagia, unspecified: Secondary | ICD-10-CM

## 2024-10-26 DIAGNOSIS — R209 Unspecified disturbances of skin sensation: Secondary | ICD-10-CM

## 2024-10-26 MED ORDER — GADOPICLENOL 0.5 MMOL/ML IV SOLN
10.0000 mL | Freq: Once | INTRAVENOUS | Status: AC | PRN
Start: 2024-10-26 — End: 2024-10-26
  Administered 2024-10-26: 10 mL via INTRAVENOUS

## 2024-10-27 ENCOUNTER — Encounter: Payer: Self-pay | Admitting: Neurology

## 2024-11-10 ENCOUNTER — Ambulatory Visit: Payer: Self-pay | Admitting: Neurology

## 2024-11-10 ENCOUNTER — Other Ambulatory Visit: Payer: Self-pay

## 2024-11-10 ENCOUNTER — Ambulatory Visit (HOSPITAL_COMMUNITY)
Admission: RE | Admit: 2024-11-10 | Discharge: 2024-11-10 | Disposition: A | Discharge status: Home / Self Care | Source: Ambulatory Visit | Attending: Neurology | Admitting: Neurology

## 2024-11-10 DIAGNOSIS — R1310 Dysphagia, unspecified: Secondary | ICD-10-CM | POA: Diagnosis present

## 2024-11-10 DIAGNOSIS — R059 Cough, unspecified: Secondary | ICD-10-CM | POA: Diagnosis not present

## 2024-11-10 DIAGNOSIS — M47812 Spondylosis without myelopathy or radiculopathy, cervical region: Secondary | ICD-10-CM

## 2024-11-10 DIAGNOSIS — R09A2 Foreign body sensation, throat: Secondary | ICD-10-CM | POA: Insufficient documentation

## 2024-11-10 NOTE — Progress Notes (Signed)
 Per Dr.Hill   Can you order PT , It is for cervical spondylosis.   PT ordered.

## 2024-11-10 NOTE — Evaluation (Signed)
 Modified Barium Swallow Study  Patient Details  Name: CASHAE WEICH MRN: [id number] Date of Birth: [birth year]  Today's Date: 11/10/2024  Modified Barium Swallow completed.  Full report located under Chart Review in the Imaging Section.  History of Present Illness 53 yo presenting for OP MBS due to symptoms of dysphagia that include coughing with PO intake, frequent throat clearing with globus sensation, and solids feeling stuck. PMH includes: anxiety, depression, dyspnea, FND   Clinical Impression Pt's oropharyngeal swallow is functional with good coordination, timing, and strength. There is good efficiency and airway protection with no aspiration observed. Question if some of her symptoms may be coming from an esophaeal source (globus, frequent throat clearing, coughing, solids feeling stuck), as she reported feeling several of these symptoms during a normal exam. Esophageal sweep was preformed at the end with no evidence of barium retention though. Handout was given with esophageal precautions. Would encourage additional f/u wtih MD.  Factors that may increase risk of adverse event in presence of aspiration Noe & Lianne 2021):    Swallow Evaluation Recommendations Recommendations: PO diet PO Diet Recommendation: Regular;Thin liquids (Level 0) Liquid Administration via: Cup;Straw Medication Administration: Whole meds with liquid Supervision: Patient able to self-feed Swallowing strategies  : Slow rate;Small bites/sips;Follow solids with liquids Postural changes: Position pt fully upright for meals;Stay upright 30-60 min after meals Oral care recommendations: Oral care BID (2x/day) Recommended consults: Consider GI consultation       Leita SAILOR., M.A. CCC-SLP Acute Rehabilitation Services Office: [phone]  Secure chat preferred  11/10/2024,3:28 PM

## 2024-11-13 ENCOUNTER — Encounter: Payer: Self-pay | Admitting: Oncology

## 2024-11-19 ENCOUNTER — Emergency Department (HOSPITAL_COMMUNITY)

## 2024-11-19 ENCOUNTER — Other Ambulatory Visit: Payer: Self-pay

## 2024-11-19 ENCOUNTER — Emergency Department (HOSPITAL_COMMUNITY)
Admission: EM | Admit: 2024-11-19 | Discharge: 2024-11-19 | Disposition: A | Discharge status: Home / Self Care | Attending: Emergency Medicine | Admitting: Emergency Medicine

## 2024-11-19 DIAGNOSIS — R200 Anesthesia of skin: Secondary | ICD-10-CM | POA: Diagnosis not present

## 2024-11-19 DIAGNOSIS — R4781 Slurred speech: Secondary | ICD-10-CM | POA: Insufficient documentation

## 2024-11-19 DIAGNOSIS — R519 Headache, unspecified: Secondary | ICD-10-CM | POA: Diagnosis present

## 2024-11-19 DIAGNOSIS — I10 Essential (primary) hypertension: Secondary | ICD-10-CM | POA: Insufficient documentation

## 2024-11-19 DIAGNOSIS — R110 Nausea: Secondary | ICD-10-CM

## 2024-11-19 LAB — COMPREHENSIVE METABOLIC PANEL WITH GFR
ALT: 14 U/L (ref 0–44)
AST: 20 U/L (ref 15–41)
Albumin: 4.2 g/dL (ref 3.5–5.0)
Alkaline Phosphatase: 206 U/L — ABNORMAL HIGH (ref 38–126)
Anion gap: 9 (ref 5–15)
BUN: 11 mg/dL (ref 6–20)
CO2: 26 mmol/L (ref 22–32)
Calcium: 9.3 mg/dL (ref 8.9–10.3)
Chloride: 105 mmol/L (ref 98–111)
Creatinine, Ser: 0.83 mg/dL (ref 0.44–1.00)
GFR, Estimated: >60 mL/min (ref >60)
Glucose, Bld: 103 mg/dL — ABNORMAL HIGH (ref 70–99)
Potassium: 4.1 mmol/L (ref 3.5–5.1)
Sodium: 140 mmol/L (ref 135–145)
Total Bilirubin: 0.6 mg/dL (ref 0.0–1.2)
Total Protein: 7.4 g/dL (ref 6.5–8.1)

## 2024-11-19 LAB — I-STAT CHEM 8, ED
BUN: 12 mg/dL (ref 6–20)
Calcium, Ion: 1.12 mmol/L — ABNORMAL LOW (ref 1.15–1.40)
Chloride: 106 mmol/L (ref 98–111)
Creatinine, Ser: 0.9 mg/dL (ref 0.44–1.00)
Glucose, Bld: 101 mg/dL — ABNORMAL HIGH (ref 70–99)
HCT: 45 % (ref 36.0–46.0)
Hemoglobin: 15.3 g/dL — ABNORMAL HIGH (ref 12.0–15.0)
Potassium: 4.1 mmol/L (ref 3.5–5.1)
Sodium: 140 mmol/L (ref 135–145)
TCO2: 25 mmol/L (ref 22–32)

## 2024-11-19 LAB — CBC
HCT: 45.7 % (ref 36.0–46.0)
Hemoglobin: 15.7 g/dL — ABNORMAL HIGH (ref 12.0–15.0)
MCH: 31.3 pg (ref 26.0–34.0)
MCHC: 34.4 g/dL (ref 30.0–36.0)
MCV: 91.2 fL (ref 80.0–100.0)
Platelets: 256 K/uL (ref 150–400)
RBC: 5.01 MIL/uL (ref 3.87–5.11)
RDW: 12.5 % (ref 11.5–15.5)
WBC: 6 K/uL (ref 4.0–10.5)
nRBC: 0 % (ref 0.0–0.2)

## 2024-11-19 LAB — ETHANOL: Alcohol, Ethyl (B): <15 mg/dL (ref <15)

## 2024-11-19 LAB — DIFFERENTIAL
Abs Immature Granulocytes: 0.03 K/uL (ref 0.00–0.07)
Basophils Absolute: 0 K/uL (ref 0.0–0.1)
Basophils Relative: 1 %
Eosinophils Absolute: 0.2 K/uL (ref 0.0–0.5)
Eosinophils Relative: 3 %
Immature Granulocytes: 1 %
Lymphocytes Relative: 26 %
Lymphs Abs: 1.6 K/uL (ref 0.7–4.0)
Monocytes Absolute: 0.3 K/uL (ref 0.1–1.0)
Monocytes Relative: 5 %
Neutro Abs: 3.9 K/uL (ref 1.7–7.7)
Neutrophils Relative %: 64 %

## 2024-11-19 LAB — APTT: aPTT: 29 s (ref 24–36)

## 2024-11-19 LAB — CBG MONITORING, ED: Glucose-Capillary: 101 mg/dL — ABNORMAL HIGH (ref 70–99)

## 2024-11-19 LAB — PROTIME-INR
INR: 0.9 (ref 0.8–1.2)
Prothrombin Time: 13.2 s (ref 11.4–15.2)

## 2024-11-19 MED ORDER — PROCHLORPERAZINE EDISYLATE 10 MG/2ML IJ SOLN
10.0000 mg | Freq: Once | INTRAMUSCULAR | Status: AC
  Administered 2024-11-19: 10 mg via INTRAVENOUS
  Filled 2024-11-19: qty 2

## 2024-11-19 MED ORDER — LACTATED RINGERS IV BOLUS: 1000.0000 mL | Freq: Once | INTRAVENOUS | Status: DC

## 2024-11-19 MED ORDER — DIPHENHYDRAMINE HCL 50 MG/ML IJ SOLN
12.5000 mg | Freq: Once | INTRAMUSCULAR | Status: AC
  Administered 2024-11-19: 12.5 mg via INTRAVENOUS
  Filled 2024-11-19: qty 1

## 2024-11-19 NOTE — ED Triage Notes (Signed)
 Pt ambulatory to triage. Pt states that she woke up at approx 10AM with a RIGHT sided headache. Pt went back to sleep, and woke up around 14:00 with the LEFT side of her face feeling weird, with nausea.   Pt evaluated in triage by Francis RIGGERS.

## 2024-11-19 NOTE — Discharge Instructions (Signed)
 Your test results today were reassuring.  Follow-up with your neurologist.  Return to the emergency department for any new or worsening symptoms of concern.

## 2024-11-19 NOTE — ED Notes (Signed)
 Pt still located in MRI upon accepting

## 2024-11-19 NOTE — ED Provider Notes (Signed)
 [place] EMERGENCY DEPARTMENT AT [facility] Provider Note   CSN: [id number] Arrival date & time: 11/19/24  1537     Patient presents with: Aphasia   Tiffany [name] is a 53 y.o. female.   HPI Patient presents for strokelike symptoms.  Medical history includes anxiety, depression, arthritis, HTN.  This morning, she woke up in her normal state of health.  At 10:00 this morning, she developed a right-sided headache.  She laid down to take a nap.  She slept for approximately 4 hours.  When she woke up at 2 PM, she still had a mild headache.  Approximately 20 minutes later, she developed slurred speech and numbness to left side of face.    Prior to Admission medications   Medication Sig Start Date End Date Taking? Authorizing Provider  acetaminophen  (TYLENOL ) 500 MG tablet Take 2 tablets (1,000 mg total) by mouth every 8 (eight) hours as needed. Patient taking differently: Take 1,000 mg by mouth every 8 (eight) hours as needed for mild pain (pain score 1-3) (or headaches). 02/05/24  Yes Simaan, Almarie RAMAN, PA-C  ALPRAZolam  (XANAX ) 0.5 MG tablet Take 1 tablet (0.5 mg total) by mouth daily as needed for anxiety. Patient taking differently: Take 0.5 mg by mouth daily as needed for anxiety. Take 0.5 mg by mouth at bedtime and an additional 0.5 mg once a day as needed for anxiety 10/03/16  Yes English, Stephanie D, PA  diltiazem  (CARDIZEM  CD) 120 MG 24 hr capsule Take 120 mg by mouth daily as needed (FOR SVT(s)).   Yes [provider]  DULoxetine  (CYMBALTA ) 30 MG capsule Take 30 mg by mouth daily as needed (for neurological pain).   Yes [provider]  DULoxetine  (CYMBALTA ) 60 MG capsule Take 1 capsule (60 mg total) by mouth daily. Patient taking differently: Take 60 mg by mouth at bedtime. 10/03/16  Yes Isadora Krabbe D, PA    Allergies: Beta adrenergic blockers and Stadol [butorphanol]    Review of Systems  Neurological:  Positive for speech difficulty and  numbness.  All other systems reviewed and are negative.   Updated Vital Signs BP (!) 157/82   Pulse 77   Temp 98.6 F (37 C) (Oral)   Resp 18   Wt 113.4 kg   LMP 11/08/2020   SpO2 96%   BMI 44.29 kg/m   Physical Exam Vitals and nursing note reviewed.  Constitutional:      General: She is not in acute distress.    Appearance: Normal appearance. She is well-developed. She is not ill-appearing, toxic-appearing or diaphoretic.  HENT:     Head: Normocephalic and atraumatic.     Right Ear: External ear normal.     Left Ear: External ear normal.     Nose: Nose normal.     Mouth/Throat:     Mouth: Mucous membranes are moist.  Eyes:     Extraocular Movements: Extraocular movements intact.     Conjunctiva/sclera: Conjunctivae normal.  Cardiovascular:     Rate and Rhythm: Normal rate and regular rhythm.  Pulmonary:     Effort: Pulmonary effort is normal. No respiratory distress.  Abdominal:     General: There is no distension.     Palpations: Abdomen is soft.  Musculoskeletal:        General: No swelling or deformity. Normal range of motion.     Cervical back: Normal range of motion and neck supple.  Skin:    General: Skin is warm and dry.  Capillary Refill: Capillary refill takes less than 2 seconds.     Coloration: Skin is not jaundiced or pale.  Neurological:     Mental Status: She is alert.     Comments: Diminished sensation to left side of face as well as left lower extremity; speech is slurred; tongue deviation to left.  Psychiatric:        Mood and Affect: Mood normal.        Behavior: Behavior normal.     (all labs ordered are listed, but only abnormal results are displayed) Labs Reviewed  CBC - Abnormal; Notable for the following components:      Result Value   Hemoglobin 15.7 (*)    All other components within normal limits  COMPREHENSIVE METABOLIC PANEL WITH GFR - Abnormal; Notable for the following components:   Glucose, Bld 103 (*)    Alkaline  Phosphatase 206 (*)    All other components within normal limits  I-STAT CHEM 8, ED - Abnormal; Notable for the following components:   Glucose, Bld 101 (*)    Calcium, Ion 1.12 (*)    Hemoglobin 15.3 (*)    All other components within normal limits  CBG MONITORING, ED - Abnormal; Notable for the following components:   Glucose-Capillary 101 (*)    All other components within normal limits  PROTIME-INR  APTT  DIFFERENTIAL  ETHANOL  URINE DRUG SCREEN    EKG: EKG Interpretation Date/Time:  Wednesday November 19 2024 17:07:58 EST Ventricular Rate:  67 PR Interval:  169 QRS Duration:  100 QT Interval:  428 QTC Calculation: 452 R Axis:   0  Text Interpretation: Sinus rhythm Consider left atrial enlargement Low voltage, precordial leads RSR' in V1 or V2, right VCD or RVH Confirmed by Melvenia Motto [phone]) on 11/19/2024 6:01:08 PM  Radiology: MR BRAIN WO CONTRAST Result Date: 11/19/2024 CLINICAL DATA:  Follow-up examination for stroke. EXAM: MRI HEAD WITHOUT CONTRAST TECHNIQUE: Multiplanar, multiecho pulse sequences of the brain and surrounding structures were obtained without intravenous contrast. COMPARISON:  CT from earlier the same day. FINDINGS: Brain: Cerebral volume within normal limits for age. No focal parenchymal signal abnormality. No abnormal foci of restricted diffusion to suggest acute or subacute ischemia. Gray-white matter differentiation well maintained. No encephalomalacia to suggest chronic cortical infarction or other insult. No foci of susceptibility artifact indicative of acute or chronic intracranial blood products. No mass lesion, midline shift or mass effect. Ventricles normal in size and morphology without hydrocephalus. No extra-axial fluid collection. Pituitary gland and suprasellar region within normal limits. Vascular: Major intracranial vascular flow voids are well maintained. Skull and upper cervical spine: Craniocervical junction within normal limits. Decreased  T1 signal intensity noted within the visualized bone marrow, nonspecific, but most commonly related to anemia, smoking or obesity. Sinuses/Orbits: Globes and orbital soft tissues are within normal limits. Paranasal sinuses are largely clear. No significant mastoid effusion. Other: None. IMPRESSION: Normal brain MRI. No acute intracranial abnormality identified. Electronically Signed   By: Morene Hoard M.D.   On: 11/19/2024 20:21   CT HEAD CODE STROKE WO CONTRAST (LKW 0-4.5h, LVO 0-24h) Result Date: 11/19/2024 EXAM: CT HEAD WITHOUT 11/19/2024 04:19:29 PM TECHNIQUE: CT of the head was performed without the administration of intravenous contrast. Automated exposure control, iterative reconstruction, and/or weight based adjustment of the mA/kV was utilized to reduce the radiation dose to as low as reasonably achievable. COMPARISON: Head CT 2825 and MRI [id number]. CLINICAL HISTORY: Neuro deficit, acute, stroke suspected. Right sided headache. Abnormal sensation in  the left face. Nausea. FINDINGS: BRAIN AND VENTRICLES: There is no evidence of an acute infarct, intracranial hemorrhage, mass, midline shift, hydrocephalus, or extra-axial fluid collection. Cerebral volume is normal. Mild cerebellar tonsillar ectopia is again noted. ORBITS: No acute abnormality. SINUSES AND MASTOIDS: Mild mucosal thickening in the paranasal sinuses. Clear mastoid air cells. SOFT TISSUES AND SKULL: No acute skull fracture. No acute soft tissue abnormality. **Alberta Stroke Program Early CT Score (ASPECTS)** ----- Ganglionic (caudate, IC, lentiform nucleus, insula, M1-M3): 7 Supraganglionic (M4-M6): 3 Total: 10 IMPRESSION: 1. No acute intracranial abnormality. ASPECTS of 10. 2. These results were communicated to Dr. DOROTHA Cummins at 4:25 PM on 11/19/2024 by secure text page via the [facility] messaging system. Electronically signed by: Dasie Hamburg MD 11/19/2024 04:27 PM EST RP Workstation: [id number]     Procedures   Medications Ordered in the  ED  lactated ringers  bolus 1,000 mL (1,000 mLs Intravenous Patient Refused/Not Given 11/19/24 1929)  prochlorperazine  (COMPAZINE ) injection 10 mg (10 mg Intravenous Given 11/19/24 1738)  diphenhydrAMINE  (BENADRYL ) injection 12.5 mg (12.5 mg Intravenous Given 11/19/24 1739)                                    Medical Decision Making Amount and/or Complexity of Data Reviewed Labs: ordered. Radiology: ordered.  Risk Prescription drug management.   This patient presents to the ED for concern of headache and strokelike symptoms, this involves an extensive number of treatment options, and is a complaint that carries with it a high risk of complications and morbidity.  The differential diagnosis includes complex migraine, CVA, seizure, neoplasm, conversion disorder   Co morbidities / Chronic conditions that complicate the patient evaluation  anxiety, depression, arthritis, HTN   Additional history obtained:  Additional history obtained from EMR External records from outside source obtained and reviewed including patient's significant other   Lab Tests:  I Ordered, and personally interpreted labs.  The pertinent results include: Normal hemoglobin, no leukocytosis, normal kidney function, normal electrolytes   Imaging Studies ordered:  I ordered imaging studies including CT head, MRI brain I independently visualized and interpreted imaging which showed no acute findings I agree with the radiologist interpretation   Cardiac Monitoring: / EKG:  The patient was maintained on a cardiac monitor.  I personally viewed and interpreted the cardiac monitored which showed an underlying rhythm of: Sinus rhythm   Problem List / ED Course / Critical interventions / Medication management  Patient presenting for headache and strokelike symptoms.  Onset of headache was 10 AM this morning.  Onset of slurred speech, left arm numbness was 2:20 PM.  On arrival in the ED, vital signs are notable  for hypertension.  Her husband is at bedside who confirms abnormal speech.  She endorses diminished in station to left side of face and left leg.  She does have tongue deviation to the left.  Code stroke was initiated.  Headache cocktail was ordered for possible complex migraine.  Noncontrasted CT scan of head did not show any acute findings.  I spoke with neurologist on-call, Dr. Cummins, who recommends MRI.  If MRI negative, patient is cleared from neurology standpoint.  Patient did have resolution of headache, abnormal speech, and numbness while in the ED.  Her MRI did not show any acute findings.  She was discharged in stable condition. I ordered medication including IV fluids for hydration, Compazine  and Benadryl  for headache Reevaluation of the patient after  these medicines showed that the patient improved I have reviewed the patients home medicines and have made adjustments as needed   Consultations Obtained:  I requested consultation with the neurologist, Dr. Jerri,  and discussed lab and imaging findings as well as pertinent plan - they recommend: MRI, discharge if negative and patient improved.   Social Determinants of Health:  Lives independently     Final diagnoses:  Bad headache  Slurred speech    ED Discharge Orders     None          Melvenia Motto, MD 11/19/24 2043

## 2024-11-19 NOTE — Consult Note (Addendum)
 [facility] Telemedicine Consult   Requesting Provider: Dr. 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  Per patient, she started have headache at 10 AM this morning.  However, she decided to take a nap at that time.  She woke up at 2 PM found to have slurred speech, numbness on left face and also nausea but no vomiting.  Her headache seems better but not resolved.  She decided come to ER for evaluation.  BP 170/96, glucose 101.  CT no acute abnormality.  In 09/2022 patient admitted for seizure-like activity with whole body shaking, not able to talk but no loss of consciousness.  She stated that she had some mild episode at home.  EEG captured some of her typical episodes but no EEG correlation.  Patient was diagnosed with functional neurological disorder.  She was educated on this condition and recommended psychological counseling and psychiatry treatment.  She followed up with Dr. Leigh at [facility] in 07/2024 for possible functional neurologic disorder, chronic pain syndrome, fibromyalgia.  Put on Cymbalta .  MRI cervical spine this month showed normal cervical spinal cord with  multilevel cervical spondylosis and diffuse spinal stenosis.  LKW: 10 AM TNK given?: No, outside window IR Thrombectomy? No, low NIH score, not expecting LVO Modified Rankin Scale: 1-No significant post stroke disability and can perform usual duties with stroke symptoms   Exam: Vitals:   11/19/24 1546  BP: (!) 170/96  Pulse: 85  Resp: 20  SpO2: 96%     Pulse Rate:  [85] 85 (11/26 1546) Resp:  [20] 20 (11/26 1546) BP: (170)/(96) 170/96 (11/26 1546) SpO2:  [96 %] 96 % (11/26 1546) Weight:  [113.4 kg] 113.4 kg (11/26 1544)  General - Well nourished, well developed, in no apparent  distress.  Ophthalmologic - fundi not visualized due to noncooperation.  Cardiovascular - Regular rhythm and rate.  Neuro - awake, alert, eyes open, orientated to age, place, time. No aphasia, fluent language but mild dysarthria, following all simple commands. Able to name and repeat in dysarthric voice. No gaze palsy, tracking bilaterally, visual field full. No significant acial droop. Bilateral UEs 5/5, no drift. Bilaterally LEs 3/5 with sitting position, symmetrical. b/l FTN intact. However, while doing HTS, pt lack of effort on the left. Pt was able to transition herself from CT table to stretch without difficulty. Sensation subjectively decreased on the left arm leg and face, gait not tested.     NIH Stroke Scale  Level Of Consciousness 0=Alert; keenly responsive 1=Arouse to minor stimulation 2=Requires repeated stimulation to arouse or movements to pain 3=postures or unresponsive 0  LOC Questions to Month and Age 53=Answers both questions correctly 1=Answers one question correctly or dysarthria/intubated/trauma/language barrier 2=Answers neither question correctly or aphasia 0  LOC Commands      -Open/Close eyes     -Open/close grip     -Pantomime commands if communication barrier 0=Performs both tasks correctly 1=Performs one task correctly 2=Performs neighter task correctly 0  Best Gaze     -Only assess horizontal gaze 0=Normal 1=Partial gaze palsy 2=Forced deviation, or total gaze paresis 0  Visual 0=No visual loss 1=Partial hemianopia 2=Complete hemianopia 3=Bilateral hemianopia (blind including cortical blindness) 0  Facial Palsy     -Use grimace if obtunded 0=Normal symmetrical movement 1=Minor paralysis (asymmetry) 2=Partial paralysis (lower face) 3=Complete paralysis (upper and  lower face) 0  Motor  0=No drift for 10/5 seconds 1=Drift, but does not hit bed 2=Some antigravity effort, hits  bed 3=No effort against gravity, limb falls 4=No  movement 0=Amputation/joint fusion Right Arm 0     Leg 0    Left Arm 0     Leg 0  Limb Ataxia     - FNT/HTS 0=Absent or does not understand or paralyzed or amputation/joint fusion 1=Present in one limb 2=Present in two limbs 0  Sensory 0=Normal 1=Mild to moderate sensory loss 2=Severe to total sensory loss or coma/unresponsive 1  Best Language 0=No aphasia, normal 1=Mild to moderate aphasia 2=Severe aphasia 3=Mute, global aphasia, or coma/unresponsive 1  Dysarthria 0=Normal 1=Mild to moderate 2=Severe, unintelligible or mute/anarthric 0=intubated/unable to test 1  Extinction/Neglect 0=No abnormality 1=visual/tactile/auditory/spatia/personal inattention/Extinction to bilateral simultaneous stimulation 2=Profound neglect/extinction more than 1 modality  0  Total   3      Imaging Reviewed:  CT HEAD CODE STROKE WO CONTRAST (LKW 0-4.5h, LVO 0-24h) Result Date: 11/19/2024 EXAM: CT HEAD WITHOUT 11/19/2024 04:19:29 PM TECHNIQUE: CT of the head was performed without the administration of intravenous contrast. Automated exposure control, iterative reconstruction, and/or weight based adjustment of the mA/kV was utilized to reduce the radiation dose to as low as reasonably achievable. COMPARISON: Head CT 2825 and MRI [id number]. CLINICAL HISTORY: Neuro deficit, acute, stroke suspected. Right sided headache. Abnormal sensation in the left face. Nausea. FINDINGS: BRAIN AND VENTRICLES: There is no evidence of an acute infarct, intracranial hemorrhage, mass, midline shift, hydrocephalus, or extra-axial fluid collection. Cerebral volume is normal. Mild cerebellar tonsillar ectopia is again noted. ORBITS: No acute abnormality. SINUSES AND MASTOIDS: Mild mucosal thickening in the paranasal sinuses. Clear mastoid air cells. SOFT TISSUES AND SKULL: No acute skull fracture. No acute soft tissue abnormality. **Alberta Stroke Program Early CT Score (ASPECTS)** ----- Ganglionic (caudate, IC, lentiform nucleus,  insula, M1-M3): 7 Supraganglionic (M4-M6): 3 Total: 10 IMPRESSION: 1. No acute intracranial abnormality. ASPECTS of 10. 2. These results were communicated to Dr. DOROTHA Cummins at 4:25 PM on 11/19/2024 by secure text page via the [facility] messaging system. 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 BP 170/96, glucose 101.  CT no acute abnormality. NIHSS = 3 with some functional component. Pt not TNK candidate due to outside window, not IR candidate due to low NIHSS and no LVO suspected. Pt symptoms concerning for conversion disorder but DDx including hypertensive encephalopathy and complicated migraine but will need stat MRI to rule out stroke. If MRI negative, and pt felt better able to ambulate in ER, she may be discharged from neuro standpoint with close neuro follow up.    Recommendations:  stat MRI to rule out stroke.  If MRI negative, and pt felt better able to ambulate in ER, she may be discharged from neuro standpoint with close neuro follow up.  OK to continue home meds if passed bedside swallow screen HA management per EDP Gradually normalize BP in 2-3 days Neuro check  Vital signs Discussed with Dr. Melvenia EDP Will follow   Consult Participants: RN, pt, me Location of the provider: [facility]  Location of the patient: [facility]  Time Code Stroke Page received:  1602 Time neurologist arrived:  1604 Time NIHSS completed: 1622    This consult was provided via telemedicine with 2-way video and audio communication. The patient/family was informed that care would be provided in this way and agreed to  receive care in this manner.   This patient is receiving care for possible acute neurological changes. There was 60 minutes of care by this provider at the time of service, including time for direct evaluation via telemedicine, review of medical records, imaging studies and discussion of findings with providers, the patient and/or family.  Ary Cummins, MD PhD Stroke Neurology 11/19/2024 4:29 PM

## 2024-11-26 ENCOUNTER — Ambulatory Visit
